# Patient Record
Sex: Female | Born: 1935 | Race: White | Hispanic: No | Smoking: Never smoker
Health system: Southern US, Community
[De-identification: ages and names within clinical notes are randomized; demographics above are authoritative.]

## PROBLEM LIST (undated history)

## (undated) DIAGNOSIS — R053 Chronic cough: Secondary | ICD-10-CM

## (undated) DIAGNOSIS — R600 Localized edema: Secondary | ICD-10-CM

## (undated) DIAGNOSIS — I119 Hypertensive heart disease without heart failure: Secondary | ICD-10-CM

## (undated) DIAGNOSIS — R05 Cough: Secondary | ICD-10-CM

## (undated) DIAGNOSIS — M1990 Unspecified osteoarthritis, unspecified site: Secondary | ICD-10-CM

## (undated) DIAGNOSIS — R609 Edema, unspecified: Secondary | ICD-10-CM

## (undated) DIAGNOSIS — K219 Gastro-esophageal reflux disease without esophagitis: Secondary | ICD-10-CM

## (undated) DIAGNOSIS — N390 Urinary tract infection, site not specified: Secondary | ICD-10-CM

## (undated) DIAGNOSIS — I442 Atrioventricular block, complete: Secondary | ICD-10-CM

## (undated) DIAGNOSIS — I639 Cerebral infarction, unspecified: Secondary | ICD-10-CM

## (undated) DIAGNOSIS — I4891 Unspecified atrial fibrillation: Secondary | ICD-10-CM

## (undated) DIAGNOSIS — I10 Essential (primary) hypertension: Secondary | ICD-10-CM

## (undated) HISTORY — DX: Gastro-esophageal reflux disease without esophagitis: K21.9

## (undated) HISTORY — PX: TONSILLECTOMY AND ADENOIDECTOMY: SHX28

## (undated) HISTORY — DX: Cough: R05

## (undated) HISTORY — DX: Essential (primary) hypertension: I10

## (undated) HISTORY — PX: SHOULDER SURGERY: SHX246

## (undated) HISTORY — DX: Localized edema: R60.0

## (undated) HISTORY — DX: Hypertensive heart disease without heart failure: I11.9

## (undated) HISTORY — DX: Unspecified osteoarthritis, unspecified site: M19.90

## (undated) HISTORY — PX: APPENDECTOMY: SHX54

## (undated) HISTORY — DX: Unspecified atrial fibrillation: I48.91

## (undated) HISTORY — DX: Atrioventricular block, complete: I44.2

## (undated) HISTORY — DX: Chronic cough: R05.3

## (undated) HISTORY — PX: PARTIAL HYSTERECTOMY: SHX80

## (undated) HISTORY — DX: Edema, unspecified: R60.9

---

## 1978-02-24 HISTORY — PX: INSERT / REPLACE / REMOVE PACEMAKER: SUR710

## 1994-04-26 HISTORY — PX: INSERT / REPLACE / REMOVE PACEMAKER: SUR710

## 1998-10-23 ENCOUNTER — Encounter: Payer: Self-pay | Admitting: Emergency Medicine

## 1998-10-23 ENCOUNTER — Emergency Department (HOSPITAL_COMMUNITY): Admission: EM | Admit: 1998-10-23 | Discharge: 1998-10-23 | Payer: Self-pay | Admitting: Emergency Medicine

## 1999-06-28 ENCOUNTER — Other Ambulatory Visit: Admission: RE | Admit: 1999-06-28 | Discharge: 1999-06-28 | Payer: Self-pay | Admitting: Obstetrics and Gynecology

## 1999-09-02 ENCOUNTER — Encounter: Payer: Self-pay | Admitting: Orthopaedic Surgery

## 1999-09-02 ENCOUNTER — Encounter: Admission: RE | Admit: 1999-09-02 | Discharge: 1999-09-02 | Payer: Self-pay | Admitting: Orthopaedic Surgery

## 1999-09-03 ENCOUNTER — Ambulatory Visit (HOSPITAL_BASED_OUTPATIENT_CLINIC_OR_DEPARTMENT_OTHER): Admission: RE | Admit: 1999-09-03 | Discharge: 1999-09-03 | Payer: Self-pay | Admitting: Orthopaedic Surgery

## 2000-12-14 ENCOUNTER — Other Ambulatory Visit: Admission: RE | Admit: 2000-12-14 | Discharge: 2000-12-14 | Payer: Self-pay | Admitting: Obstetrics and Gynecology

## 2001-07-22 ENCOUNTER — Encounter: Admission: RE | Admit: 2001-07-22 | Discharge: 2001-07-22 | Payer: Self-pay | Admitting: Family Medicine

## 2001-07-22 ENCOUNTER — Encounter: Payer: Self-pay | Admitting: Family Medicine

## 2001-08-19 ENCOUNTER — Ambulatory Visit (HOSPITAL_COMMUNITY): Admission: RE | Admit: 2001-08-19 | Discharge: 2001-08-19 | Payer: Self-pay | Admitting: Gastroenterology

## 2001-08-31 ENCOUNTER — Other Ambulatory Visit: Admission: RE | Admit: 2001-08-31 | Discharge: 2001-08-31 | Payer: Self-pay | Admitting: Obstetrics and Gynecology

## 2001-12-09 ENCOUNTER — Other Ambulatory Visit: Admission: RE | Admit: 2001-12-09 | Discharge: 2001-12-09 | Payer: Self-pay | Admitting: Obstetrics and Gynecology

## 2002-04-07 ENCOUNTER — Encounter: Payer: Self-pay | Admitting: Family Medicine

## 2002-04-07 ENCOUNTER — Encounter: Admission: RE | Admit: 2002-04-07 | Discharge: 2002-04-07 | Payer: Self-pay | Admitting: Family Medicine

## 2002-08-30 ENCOUNTER — Encounter: Admission: RE | Admit: 2002-08-30 | Discharge: 2002-08-30 | Payer: Self-pay | Admitting: Family Medicine

## 2002-08-30 ENCOUNTER — Encounter: Payer: Self-pay | Admitting: Family Medicine

## 2004-02-14 ENCOUNTER — Encounter: Admission: RE | Admit: 2004-02-14 | Discharge: 2004-02-14 | Payer: Self-pay | Admitting: Family Medicine

## 2005-01-15 ENCOUNTER — Ambulatory Visit (HOSPITAL_COMMUNITY): Admission: RE | Admit: 2005-01-15 | Discharge: 2005-01-15 | Payer: Self-pay | Admitting: Gastroenterology

## 2005-01-15 ENCOUNTER — Encounter (INDEPENDENT_AMBULATORY_CARE_PROVIDER_SITE_OTHER): Payer: Self-pay | Admitting: *Deleted

## 2005-06-04 ENCOUNTER — Emergency Department (HOSPITAL_COMMUNITY): Admission: EM | Admit: 2005-06-04 | Discharge: 2005-06-04 | Payer: Self-pay | Admitting: Emergency Medicine

## 2007-07-23 ENCOUNTER — Encounter: Admission: RE | Admit: 2007-07-23 | Discharge: 2007-07-23 | Payer: Self-pay | Admitting: Gastroenterology

## 2007-08-04 ENCOUNTER — Ambulatory Visit (HOSPITAL_COMMUNITY): Admission: RE | Admit: 2007-08-04 | Discharge: 2007-08-04 | Payer: Self-pay | Admitting: Gastroenterology

## 2007-08-11 ENCOUNTER — Encounter: Admission: RE | Admit: 2007-08-11 | Discharge: 2007-08-11 | Payer: Self-pay | Admitting: Gastroenterology

## 2008-05-05 ENCOUNTER — Ambulatory Visit (HOSPITAL_COMMUNITY): Admission: RE | Admit: 2008-05-05 | Discharge: 2008-05-05 | Payer: Self-pay | Admitting: Gastroenterology

## 2008-09-07 ENCOUNTER — Ambulatory Visit (HOSPITAL_BASED_OUTPATIENT_CLINIC_OR_DEPARTMENT_OTHER): Admission: RE | Admit: 2008-09-07 | Discharge: 2008-09-07 | Payer: Self-pay | Admitting: Orthopedic Surgery

## 2009-01-09 ENCOUNTER — Encounter: Admission: RE | Admit: 2009-01-09 | Discharge: 2009-01-09 | Payer: Self-pay | Admitting: Gastroenterology

## 2009-04-26 HISTORY — PX: INSERT / REPLACE / REMOVE PACEMAKER: SUR710

## 2009-05-11 ENCOUNTER — Ambulatory Visit (HOSPITAL_COMMUNITY): Admission: AD | Admit: 2009-05-11 | Discharge: 2009-05-12 | Payer: Self-pay | Admitting: Cardiology

## 2010-04-25 ENCOUNTER — Ambulatory Visit (HOSPITAL_COMMUNITY): Admission: RE | Admit: 2010-04-25 | Discharge: 2010-04-25 | Payer: Self-pay | Admitting: Gastroenterology

## 2010-06-27 ENCOUNTER — Ambulatory Visit: Payer: Self-pay | Admitting: *Deleted

## 2010-07-12 ENCOUNTER — Ambulatory Visit: Payer: Self-pay | Admitting: Cardiovascular Disease

## 2010-07-29 ENCOUNTER — Encounter: Admission: RE | Admit: 2010-07-29 | Discharge: 2010-07-29 | Payer: Self-pay | Admitting: Family Medicine

## 2010-08-01 ENCOUNTER — Ambulatory Visit: Payer: Self-pay | Admitting: Cardiology

## 2010-08-01 ENCOUNTER — Encounter: Payer: Self-pay | Admitting: Internal Medicine

## 2010-08-31 ENCOUNTER — Encounter: Payer: Self-pay | Admitting: Internal Medicine

## 2010-09-17 DIAGNOSIS — Z950 Presence of cardiac pacemaker: Secondary | ICD-10-CM

## 2010-09-18 DIAGNOSIS — I495 Sick sinus syndrome: Secondary | ICD-10-CM | POA: Insufficient documentation

## 2010-09-18 DIAGNOSIS — I10 Essential (primary) hypertension: Secondary | ICD-10-CM | POA: Insufficient documentation

## 2010-09-25 ENCOUNTER — Ambulatory Visit: Payer: Self-pay | Admitting: Internal Medicine

## 2010-11-28 NOTE — Letter (Signed)
Summary: [facility]  [facility]   Imported By: Marylou Mccoy 10/02/2010 10:29:56  _____________________________________________________________________  External Attachment:    Type:   Image     Comment:   External Document

## 2010-11-28 NOTE — Miscellaneous (Signed)
Summary: Device preload  Clinical Lists Changes  Observations: Added new observation of PPM INDICATN: Sick sinus syndrome (08/31/2010 13:10) Added new observation of MAGNET RTE: BOL 85 ERI 65 (08/31/2010 13:10) Added new observation of PPMLEADSTAT2: active (08/31/2010 13:10) Added new observation of PPMLEADSER2: 161096  (08/31/2010 13:10) Added new observation of PPMLEADMOD2: 4470  (08/31/2010 13:10) Added new observation of PPMLEADDOI2: 05/11/2009  (08/31/2010 13:10) Added new observation of PPMLEADLOC2: RV  (08/31/2010 13:10) Added new observation of PPMLEADSTAT1: active  (08/31/2010 13:10) Added new observation of PPMLEADSER1: 045409  (08/31/2010 13:10) Added new observation of PPMLEADMOD1: 4469  (08/31/2010 13:10) Added new observation of PPMLEADDOI1: 05/11/2009  (08/31/2010 13:10) Added new observation of PPMLEADLOC1: RA  (08/31/2010 13:10) Added new observation of PPM DOI: 05/11/2009  (08/31/2010 13:10) Added new observation of PPM SERL#: [id number]  (08/31/2010 13:10) Added new observation of PPM MODL#: [id number]  (08/31/2010 95:62) Added new observation of PACEMAKERMFG: Medtronic  (08/31/2010 13:10) Added new observation of PPM IMP MD: Duffy Rhody Tennant,MD  (08/31/2010 13:10) Added new observation of PPM REFER MD: Villa Herb  (08/31/2010 13:10) Added new observation of PACEMAKER MD: Hillis Range, MD  (08/31/2010 13:10)      PPM Specifications Following MD:  Hillis Range, MD     Referring MD:  Villa Herb PPM Vendor:  Medtronic     PPM Model Number:  [id number]     PPM Serial Number:  [id number] PPM DOI:  05/11/2009     PPM Implanting MD:  Rolla Plate  Lead 1    Location: RA     DOI: 05/11/2009     Model #: 4469     Serial #: [id number]     Status: active Lead 2    Location: RV     DOI: 05/11/2009     Model #: 4470     Serial #: [id number]     Status: active  Magnet Response Rate:  BOL 85 ERI 65  Indications:  Sick sinus syndrome

## 2010-11-28 NOTE — Cardiovascular Report (Signed)
Summary: Office Visit   Office Visit   Imported By: Roderic Ovens 10/01/2010 10:43:55  _____________________________________________________________________  External Attachment:    Type:   Image     Comment:   External Document

## 2010-11-28 NOTE — Assessment & Plan Note (Signed)
Summary: pacer check/ medtronic   Visit Type:  Initial Consult Referring Provider:  Dr Patty Sermons   History of Present Illness: [name] is a pleasant 77 yo WF with a h/o sinus node dysfunction and AV block s/p PPM who presents today to establish EP care.  She is followed by Dr Patty Sermons and has done well recently.  She reports stable BLE edema.  She denies symptoms of palpitations, chest pain, shortness of breath, orthopnea, PND,dizziness, presyncope, syncope, or neurologic sequela. The patient is tolerating medications without difficulties and is otherwise without complaint today.   Current Medications (verified): 1)  Oxybutynin Chloride 5 Mg Xr24h-Tab (Oxybutynin Chloride) .... Once Daily 2)  Nexium 40 Mg Cpdr (Esomeprazole Magnesium) .... Once Daily 3)  Zoloft 100 Mg Tabs (Sertraline Hcl) .Marland Kitchen.. 1 1/2 Daily 4)  Hydrochlorothiazide 25 Mg Tabs (Hydrochlorothiazide) .... Take One Tablet By Mouth Daily. 5)  Amlodipine Besylate 5 Mg Tabs (Amlodipine Besylate) .... 1/2 Tablet Once Daily 6)  Metoprolol Succinate 25 Mg Xr24h-Tab (Metoprolol Succinate) .... 1/2 Tablet 7)  Aspirin 81 Mg Tbec (Aspirin) .... Take One Tablet By Mouth Daily 8)  Citracal Plus  Tabs (Multiple Minerals-Vitamins) .... Uad 9)  Eye Vitamins  Caps (Multiple Vitamins-Minerals) .... Uad 10)  Vitamin B-12 1000 Mcg Tabs (Cyanocobalamin) .... Daily 11)  Multivitamins   Tabs (Multiple Vitamin) .... Daily 12)  Vitamin C 500 Mg  Tabs (Ascorbic Acid) 13)  Fish Oil   Oil (Fish Oil) 14)  Vitamin D3 5000 Unit Caps (Cholecalciferol) .... Daily 15)  Benadryl 25 Mg Caps (Diphenhydramine Hcl) .... As Needed 16)  Optivar 0.05 % Soln (Azelastine Hcl) .... Uad 17)  Systane 0.4-0.3 % Soln (Polyethyl Glycol-Propyl Glycol) .... Uad  Allergies (verified): No Known Drug Allergies  Past History:  Past Medical History: Reviewed history from 09/17/2010 and no changes required. Sick Sinus syndrome Fatigue Atrial Fibrillation G E R  D Hypertension  Past Surgical History: Reviewed history from 09/17/2010 and no changes required. Abdominal Hysterectomy-Total Appendectomy Tonsillectomy  Family History: Reviewed history from 09/17/2010 and no changes required. Heart Failure Family History of CVA or Stroke:   Social History: Pt lives in [place] Use - No.  Alcohol Use - no  Review of Systems       All systems are reviewed and negative except as listed in the HPI.   Vital Signs:  Patient profile:   77 year old female Height:      63 inches Weight:      134 pounds BMI:     23.82 Pulse rate:   83 / minute BP sitting:   150 / 80  (left arm)  Vitals Entered By: Laurance Flatten CMA (September 25, 2010 10:55 AM)  Physical Exam  General:  Well developed, well nourished, in no acute distress. Head:  normocephalic and atraumatic Eyes:  PERRLA/EOM intact; conjunctiva and lids normal. Mouth:  Teeth, gums and palate normal. Oral mucosa normal. Neck:  supple Chest Wall:  bilateral pacemaker pockets are well healed Lungs:  Clear bilaterally to auscultation and percussion. Heart:  RRR (paced) Abdomen:  Bowel sounds positive; abdomen soft and non-tender without masses, organomegaly, or hernias noted. No hepatosplenomegaly. Msk:  Back normal, normal gait. Muscle strength and tone normal. Pulses:  pulses normal in all 4 extremities Extremities:  1+ BLE edema Neurologic:  Alert and oriented x 3. Skin:  Intact without lesions or rashes. Cervical Nodes:  no significant adenopathy Psych:  Normal affect.   PPM Specifications Following MD:  Hillis Range, MD  Referring MD:  Villa Herb PPM Vendor:  Medtronic     PPM Model Number:  [id number]     PPM Serial Number:  [id number] PPM DOI:  05/11/2009     PPM Implanting MD:  Duffy Rhody Tennant,MD  Lead 1    Location: RA     DOI: 05/11/2009     Model #: 4469     Serial #: [id number]     Status: active Lead 2    Location: RV     DOI: 05/11/2009     Model #: 4470      Serial #: [id number]     Status: active  Magnet Response Rate:  BOL 85 ERI 65  Indications:  Sick sinus syndrome   PPM Follow Up Battery Voltage:  2.80 V     Battery Est. Longevity:  8.5 yrs       PPM Device Measurements Atrium  Amplitude: 5.60 mV, Impedance: 471 ohms, Threshold: 0.50 V at 0.40 msec Right Ventricle  Amplitude: 11.20 mV, Impedance: 445 ohms, Threshold: 0.750 V at 0.40 msec  Episodes MS Episodes:  177     Percent Mode Switch:  <0.1%     Ventricular High Rate:  0     Atrial Pacing:  70.1%     Ventricular Pacing:  85.1%  Parameters Mode:  DDDR     Lower Rate Limit:  60     Upper Rate Limit:  110 Paced AV Delay:  200     Sensed AV Delay:  150 Next Remote Date:  12/26/2010     Next Cardiology Appt Due:  08/28/2011 Tech Comments:  [facility]---177 MODE SWITCHES--LONGEST WAS 2 MINUTES 2 SECONDS.  NORMAL DEVICE FUNCTION. PT HAS SECOND PACER SET TO VVI 65.   NO CHANGES MADE. CARELINK 12-26-10 AND ROV IN 12 MTHS W/JA. Vella Kohler  September 25, 2010 11:12 AM MD Comments:  Pts Thera pacamker has reverted to EOL and is VVI backup pacing at 65 bpm.  This creates occasional AV dyssynchrony.  I have therefore reprogrammed her new pacemaker to DDD 70 bpm to avoid pacing from her abandoned thera device.  Impression & Recommendations:  Problem # 1:  SICK SINUS/ TACHY-BRADY SYNDROME (ICD-427.81) She has AV conduction disease as well as sinus node dysfunction. Pacemaker reprogrammed as above today. s  Problem # 2:  HYPERTENSION, UNSPECIFIED (ICD-401.9) stable salt restriction  Patient Instructions: 1)  Your physician wants you to follow-up in:  1ths with D rAllred  You will receive a reminder letter in the mail two months in advance. If you don't receive a letter, please call our office to schedule the follow-up appointment. 2)  Carelink transmission on 12/26/2010

## 2010-11-29 ENCOUNTER — Ambulatory Visit (INDEPENDENT_AMBULATORY_CARE_PROVIDER_SITE_OTHER): Payer: MEDICARE | Admitting: Cardiology

## 2010-11-29 DIAGNOSIS — R609 Edema, unspecified: Secondary | ICD-10-CM

## 2010-11-29 DIAGNOSIS — Z950 Presence of cardiac pacemaker: Secondary | ICD-10-CM

## 2010-11-29 DIAGNOSIS — I119 Hypertensive heart disease without heart failure: Secondary | ICD-10-CM

## 2010-12-03 ENCOUNTER — Ambulatory Visit: Payer: Self-pay | Admitting: Cardiology

## 2010-12-13 ENCOUNTER — Other Ambulatory Visit (INDEPENDENT_AMBULATORY_CARE_PROVIDER_SITE_OTHER): Payer: MEDICARE

## 2010-12-13 DIAGNOSIS — R609 Edema, unspecified: Secondary | ICD-10-CM

## 2010-12-13 DIAGNOSIS — I10 Essential (primary) hypertension: Secondary | ICD-10-CM

## 2010-12-29 ENCOUNTER — Encounter (INDEPENDENT_AMBULATORY_CARE_PROVIDER_SITE_OTHER): Payer: Self-pay | Admitting: *Deleted

## 2011-01-07 NOTE — Letter (Signed)
Summary: Device-Delinquent Phone Journalist, newspaper, Main Office  [address]   [place], [place] [address]   Phone: [phone]  Fax: [phone]     December 29, 2010 MRN: [id number]   Brenda Cook [address] [place], [place]  [address]   Dear [name],  According to our records, you were scheduled for a device phone transmission on 12-26-10.     We did not receive any results from this check.  If you transmitted on your scheduled day, please call us to help troubleshoot your system.  If you forgot to send your transmission, please send one upon receipt of this letter.  Thank you,   Architectural technologist [facility]

## 2011-02-16 ENCOUNTER — Other Ambulatory Visit: Payer: Self-pay | Admitting: Internal Medicine

## 2011-02-17 ENCOUNTER — Ambulatory Visit (INDEPENDENT_AMBULATORY_CARE_PROVIDER_SITE_OTHER): Payer: MEDICARE | Admitting: *Deleted

## 2011-02-17 DIAGNOSIS — I495 Sick sinus syndrome: Secondary | ICD-10-CM

## 2011-02-17 DIAGNOSIS — Z950 Presence of cardiac pacemaker: Secondary | ICD-10-CM

## 2011-02-21 NOTE — Progress Notes (Signed)
Pacer remote check  

## 2011-03-05 ENCOUNTER — Encounter: Payer: Self-pay | Admitting: *Deleted

## 2011-03-07 ENCOUNTER — Other Ambulatory Visit: Payer: Self-pay | Admitting: Cardiology

## 2011-03-07 MED ORDER — HYDROCHLOROTHIAZIDE 25 MG PO TABS: 25.0000 mg | ORAL_TABLET | Freq: Every day | ORAL | Status: DC

## 2011-03-07 NOTE — Telephone Encounter (Signed)
Called daughter to schedule office visit for June, stated she was in the grocery store and would call back next week

## 2011-03-07 NOTE — Telephone Encounter (Signed)
Pt's daughter called want rx called to [facility] for furosimide and HCTZ 90 day supply

## 2011-03-11 NOTE — Op Note (Signed)
Brenda Cook, Brenda Cook                ACCOUNT NO.:  [id number]   MEDICAL RECORD NO.:  [id number]          PATIENT TYPE:  AMB   LOCATION:  DSC                          FACILITY:  [facility]   PHYSICIAN:  Cindee Salt, M.D.       DATE OF BIRTH:  1920/12/26   DATE OF PROCEDURE:  09/07/2008  DATE OF DISCHARGE:                               OPERATIVE REPORT   PREOPERATIVE DIAGNOSIS:  Carpal tunnel syndrome right hand, cubital  tunnel right elbow.   POSTOPERATIVE DIAGNOSIS:  Carpal tunnel syndrome right hand, cubital  tunnel right elbow.   OPERATION:  Decompression median nerve of the wrist and ulnar nerve at  the elbow, right arm.   SURGEON:  Cindee Salt, MD   ASSISTANT:  Carolyne Fiscal, RN   ANESTHESIA:  General.   ANESTHESIOLOGIST:  Bedelia Person, MD   HISTORY:  The patient is an 77 year old female with history of carpal  tunnel syndrome, EMG nerve conductions positive and this has not  responded to conservative treatment.  She has elected to undergo  decompression of both median nerve at the wrist, the ulnar nerve at the  elbow.  She is aware that there is no guarantee with the surgery,  possibility of infection, recurrence injury to arteries, nerves,  tendons, complete relief of symptoms, and dystrophy.  In the  preoperative area, the patient is seen.  The extremity marked by both  the patient and surgeon.  Questions again encouraged and answered.  Antibiotic given.   PROCEDURE:  The patient was brought to the operating room where general  anesthetic was carried out without difficulty under the direction of Dr.  Gypsy Balsam.  She was prepped using DuraPrep in supine position, right arm  free.  A time-out was taken.  The limb was exsanguinated with an Esmarch  bandage and tourniquet placed high on the arm was inflated to 250 mmHg.  A longitudinal incision was made in the palm and carried down through  subcutaneous.  Bleeders were electrocauterized.  The palmar fascia was  split.  Superficial palmar arch  identified.  The flexor tendon of the  ring and little finger identified to the ulnar side of median nerve and  carpal retinaculum was incised with sharp dissection.  A right angle and  Sewall retractor were placed between the skin and forearm fascia.  The  fascia released for approximately a centimeter and half proximal to the  wrist crease under direct vision.  The area of compression to the nerve  was apparent.  No further lesions were identified.  The wound was  copiously irrigated with saline.  The skin closed with interrupted 5-0  Vicryl Rapide sutures.  A separate incision was then made over the  medial epicondyle approximately 1 to 1-1/2 cm in length, carried down  through the subcutaneous tissue.  Posterior branches of the medial,  antebrachial and cutaneous nerve of the forearm was identified and  protected.  Retractors were placed.  A fasciotomy performed to the  flexor carpi ulnaris.  The deep fascia was then exposed.  Retractors  then placed and the fascia released over the  nerve.  After releasing the  Osborne fascia, proximally retractors were again placed.  Dissection  carried proximally up to the arcade of Struthers.  The nerve was  released proximally over approximately 8 cm.  The elbow was placed  through full flexion.  No subluxation to the nerve was apparent.  The  wound was irrigated and closed with interrupted 5-0 Vicryl Rapide  sutures in the skin only.  A sterile compressive dressing and splint to  the elbow in approximately 30 degrees of flexion was applied.  The wrist  was included.  The  fingers were left free.  On deflation of the tourniquet, all fingers  immediately pinked.  She was taken to the recovery room for observation  in satisfactory condition.  She will be discharged home to return to the  [facility] [place] in 1 week on Vicodin.           ______________________________  Cindee Salt, M.D.     GK/MEDQ  D:  09/07/2008  T:  09/07/2008  Job:   [id number]   cc:   Bryan Lemma. Manus Gunning, M.D.

## 2011-03-11 NOTE — Op Note (Signed)
NAMEMIRAKLE, TOMLIN                ACCOUNT NO.:  [id number]   MEDICAL RECORD NO.:  [id number]          PATIENT TYPE:  AMB   LOCATION:  ENDO                         FACILITY:  [facility]   PHYSICIAN:  Danise Edge, M.D.   DATE OF BIRTH:  [birth year]   DATE OF PROCEDURE:  05/05/2008  DATE OF DISCHARGE:                               OPERATIVE REPORT   ESOPHAGOGASTRODUODENOSCOPY REPORT   REFERRING PHYSICIAN:  Bryan Lemma. Manus Gunning, MD.   PROCEDURE INDICATIONS:  Brenda Cook is an 76 year old female born  on Sep 21, 1921.  Brenda Cook is undergoing diagnostic  esophagogastroduodenoscopy to evaluate esophageal dysphagia.   In 2002, her esophagogastroduodenoscopy was normal.  On July 23, 2007, she underwent a barium tablet and liquid barium swallow at  [facility], which revealed a persistent large smooth impression  on the mid to distal esophagus.  The 13-mm barium tablet freely passed  down the esophagus and into the stomach.  Esophageal dysmotility was  noted.   She underwent a CT scan of the chest to evaluate the esophagus.  Lower  thoracic aortic tortuosity creates esophageal tortuosity and likely  account for the findings on the esophagram.  No definite esophageal mass  was identified.   MEDICATION ALLERGIES:  None.   PAST MEDICAL - SURGICAL HISTORY:  Cataract surgery, pacemaker placement,  hysterectomy, and proctocolonoscopy to the cecum with removal of a 2-mm  sessile polyp from the hepatic flexure in March 2002.  Normal  esophagogastroduodenoscopy in 2002.   PROCEDURE:  Diagnostic esophagogastroduodenoscopy.   PREMEDICATION:  Fentanyl 25 mcg and Versed 3 mg.   PROCEDURE:  After obtaining informed consent, Brenda Cook was placed in  the left lateral decubitus position.  I administered intravenous  fentanyl and intravenous Versed to achieve conscious sedation for the  procedure.  The patient's blood pressure, oxygen saturation, and cardiac  rhythm were  monitored throughout the procedure and documented in the  medical record.   The Pentax gastroscope was passed through the posterior hypopharynx into  the esophagus without difficulty.  I did not visualize the vocal cords.   Esophagoscopy:  The proximal mid and lower segments of the esophageal  mucosa appeared completely normal.  There was no evidence for the  presence of Barrett's esophagus, erosive esophagitis, esophageal  stricture formation, or esophageal obstruction.  In the mid esophagus,  there was the impression of the thoracic aorta as demonstrated by her  CAT scan last year.   Gastroscopy:  Brenda Cook has a moderate-sized hiatal hernia.  Retroflexed view of the gastric cardia and fundus was normal.  The  diaphragmatic hiatus was patulous.  The gastric body, antrum, and  pylorus appeared completely normal.   Duodenoscopy:  The duodenal bulb and descending duodenum appeared  completely normal.   ASSESSMENT:  Essentially a normal esophagogastroduodenoscopy.  A  moderate-sized hiatal hernia is present.  There is no evidence for  mechanical esophageal obstruction.   RECOMMENDATIONS:  Brenda Cook has esophageal dysmotility as a cause for  her intermittent esophageal dysphagia.  She requires no therapy for  this.  ______________________________  Danise Edge, M.D.     MJ/MEDQ  D:  05/05/2008  T:  05/05/2008  Job:  [id number]   cc:   Bryan Lemma. Manus Gunning, M.D.

## 2011-03-11 NOTE — Op Note (Signed)
NAMEAYNSLEE, MULHALL NO.:  [id number]   MEDICAL RECORD NO.:  [id number]          PATIENT TYPE:  AMB   LOCATION:  ENDO                         [facility]   PHYSICIAN:  Danise Edge, M.D.   DATE OF BIRTH:  1920/11/05   DATE OF PROCEDURE:  08/04/2007  DATE OF DISCHARGE:                               OPERATIVE REPORT   PROCEDURE INDICATIONS:  Ms. Annaleigh Steinmeyer is an 77 year old female born  August 04, 1921.   When Ms. Buzan began experiencing esophageal dysphagia she underwent a  barium tablet - liquid barium swallow on July 23, 2007 at  [facility] which revealed a persistent large smooth impression  on the mid to distal esophagus.  The 13-mm barium tablet freely passed  down the esophagus and into the stomach.  Esophageal dysmotility was  noted.   MEDICATION ALLERGIES:  None.   PAST MEDICAL-SURGICAL HISTORY:  Cataract surgery, pacemaker placement,  hysterectomy March 2006, proctocolonoscopy to the cecum with removal of  a 2-mm sessile polyp from the hepatic flexure, 2002  esophagogastroduodenoscopy normal.   ENDOSCOPIST:  Danise Edge, M.D.   PREMEDICATION:  Fentanyl 25 mcg, Versed 3 mg.   PROCEDURE:  After obtaining informed consent, Ms. Kulpa was placed in  the left lateral decubitus position.  I administered intravenous  fentanyl and intravenous Versed to achieve conscious sedation for the  procedure.  The patient's blood pressure, oxygen saturation and cardiac  rhythm were monitored throughout the procedure and documented in the  medical record.   The Pentax gastroscope was passed through the posterior hypopharynx into  the proximal esophagus without difficulty.  The hypopharynx, larynx and  vocal cords appeared normal.   Esophagoscopy:  The proximal, mid and lower segments of the esophageal  mucosa appear completely normal.  The squamocolumnar junction and  esophagogastric junction are noted at approximately 40 cm from the  incisor teeth.  At approximately 30 - 35 cm from the incisor teeth there  is a subepithelial mass.   Gastroscopy:  Retroflex view of the gastric cardia and fundus was  normal.  The gastric body, antrum and pylorus appeared normal.   Duodenoscopy:  The duodenal bulb and descending duodenum appeared  normal.   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  This could be due to extrinsic compression of the  esophagus or a tumor in the wall of the esophagus.   PLAN:  Will schedule Ms. Bamberg for a CT scan of the chest as  recommended by radiology.           ______________________________  Danise Edge, M.D.     MJ/MEDQ  D:  08/04/2007  T:  08/04/2007  Job:  [id number]   cc:   Bryan Lemma. Manus Gunning, M.D.  Fax: [phone]

## 2011-03-11 NOTE — Cardiovascular Report (Signed)
Brenda Cook, Brenda Cook                ACCOUNT NO.:  [id number]   MEDICAL RECORD NO.:  [id number]          PATIENT TYPE:  INP   LOCATION:  2020                         FACILITY:  [facility]   PHYSICIAN:  Colleen Can. Deborah Chalk, M.D.DATE OF BIRTH:  11-14-1920   DATE OF PROCEDURE:  05/11/2009  DATE OF DISCHARGE:                            CARDIAC CATHETERIZATION   PROCEDURE:  Implantation of a new pulse generator and new atrial and  ventricular leads because of end of life and failure of old lead system.  The old lead system and old pulse generator were programmed off but were  not removed.   The left subclavicular area was prepped and draped.  The area was  infiltrated with 1% Xylocaine.  A subcutaneous pocket was created in the  prepectoral fascia.  Two punctures were made into the subclavian vein  over the top first rib.  Using 7-[facility] introducers, the atrial and  ventricular leads were introduced.  The ventricular lead is a Guidant  bipolar screw-in lead, model 4470, 52 cm, serial number [id number].  Initially, it was positioned in the right ventricular apex but threshold  was unsatisfactory.  It was subsequently positioned on the septum with  excellent thresholds.  The R waves measured 11.1 mV.  Ventricular lead  impedance was 490 ohms.  The ventricular capture threshold was 0.4 volts  with a current of 1.1 mA with 0.5 seconds pulse width.   The atrial lead is a Guidant bipolar screw-in lead, model 4469, 45 cm,  serial number [id number].  The atrial thresholds showed P waves of 2.1  mV.  Atrial lead impedance is 481 ohms.  Atrial capture threshold is 0.8  volts with a current of 1.4 mA of 0.5 milliseconds pulse width.  The  leads were sutured in place.  Wound was flushed with kanamycin solution.  The leads were connected to a Medtronic VERSA VEDRO1, unit number  [id number].  The unit was sutured in place.  The wound was closed with 2-  0 and subsequently 4-0 Vicryl.  Steri-Strips were  applied.  The patient  tolerated the procedure well.      Colleen Can. Deborah Chalk, M.D.  Electronically Signed     SNT/MEDQ  D:  05/11/2009  T:  05/11/2009  Job:  [id number]

## 2011-03-11 NOTE — H&P (Signed)
Brenda Cook, Brenda Cook                ACCOUNT NO.:  [id number]   MEDICAL RECORD NO.:  [id number]           PATIENT TYPE:   LOCATION:                                 FACILITY:   PHYSICIAN:  Colleen Can. Deborah Chalk, M.D.    DATE OF BIRTH:   DATE OF ADMISSION:  05/11/2009  DATE OF DISCHARGE:                              HISTORY & PHYSICAL   CHIEF COMPLAINT:  None.   HISTORY OF PRESENT ILLNESS:  Brenda Cook is a very pleasant elderly 77-year-  old female who is referred for pacemaker implantation.  She has an  original pacemaker unit in place that was originally placed in 1979.  She has a known fractured atrial lead.  Her ventricular lead is unipolar  and has a high impedance level.  She had a new atrial lead placed with  generator change out in July 1995.  She now presents with end-of-life  parameters.  She is subsequently referred for implantation of a new dual-  chamber system on the opposite side.  Clinically, she has done well with  no complaints of chest pain, shortness of breath, lightheadedness, or  dizziness.   PAST MEDICAL HISTORY:  1. Sick sinus syndrome with history of pacemaker implanted in May      1979.  She had change out in July 1995 and at that time had a new      atrial lead placed.  Her ventricular lead has been programmed      unipolar and has a high impedance level.  She has been programmed      VVI.  2. History of appendectomy, tonsillectomy, partial hysterectomy.  3. Lower extremity edema.  4. Hypertension.  5. Chronic cough.  6. Remote history of transient atrial fibrillation.  7. Chronic fatigue.  8. History of shoulder surgery.  9. GERD.  10.Arthritis.   ALLERGIES:  ACE INHIBITORS cause cough.  There is questionable allergy  to [facility].   CURRENT MEDICATIONS:  1. Zoloft 50 mg 1-1/2 tablets daily.  2. Nexium daily.  3. Hydrochlorothiazide 25 mg half a tablet every other day.  4. Baby aspirin daily.  5. Detrol LA daily.  6. Robitussin p.r.n.  7. Advil p.r.n.  8. Benadryl p.r.n.  9. Amlodipine 5 mg a day.  10.Citrucel daily.  11.B CAP daily.  12.ICaps daily.  13.B12 daily.  14.Multivitamin daily.  15.Flaxseed oil daily.   FAMILY HISTORY:  Father died at 13 with stroke.  Mother died at age 2  of heart failure.   SOCIAL HISTORY:  She has no current alcohol or tobacco use.  She has a  daughter that is a very attentive.  She previously worked at [employer].   REVIEW OF SYSTEMS:  She has had no recent fever, flu, or chills.  She  ambulates with the assistance of a cane.  She denies chest pain,  shortness of breath, lightheadedness, dizziness.  She does have some  degree of chronic fatigue.  All other review of systems are negative.   PHYSICAL EXAMINATION:  GENERAL:  She is a very pleasant elderly white  female, who looks younger  than her stated age.  She is in no acute  distress.  She is alert and conversive.  VITAL SIGNS:  Weight is 134 pounds.  Blood pressure 150/78, heart rate  66 and regular, respirations 18.  She is afebrile.  SKIN:  Warm and dry.  Color is unremarkable.  HEENT:  Normocephalic, atraumatic.  Pupils are equal and reactive.  Sclerae is nonicteric.  NECK:  Supple.  No masses.  No JVD.  LUNGS:  Clear.  HEART:  Regular rhythm.  Pacemaker is in the right upper chest.  ABDOMEN:  Soft positive bowel sounds, nontender.  EXTREMITIES:  Without edema.  MUSCULOSKELETAL:  Strength to be symmetric.  Gait and range of motion  are intact.  NEUROLOGIC:  No gross focal deficits.   PERTINENT LABORATORY DATA:  Pending.   OVERALL IMPRESSION:  1. End-of-life pulse generator.  2. Abnormal function of the ventricular lead with high impedance level      in the unipolar setting.  3. Hypertension.   PLAN:  We will proceed on with new implantation of a dual-chamber  system.  This implantation will occur on the left side.  The procedure  has been reviewed in full detail, and she is willing to proceed on  Friday, May 11, 2009.      Sharlee Blew, N.P.      Colleen Can. Deborah Chalk, M.D.  Electronically Signed    LC/MEDQ  D:  05/09/2009  T:  05/10/2009  Job:  [id number]

## 2011-03-14 NOTE — Op Note (Signed)
Brenda Cook, Brenda Cook NO.:  [id number]   MEDICAL RECORD NO.:  [id number]          PATIENT TYPE:  AMB   LOCATION:  ENDO                         FACILITY:  [facility]   PHYSICIAN:  Danise Edge, M.D.   DATE OF BIRTH:  08-04-1921   DATE OF PROCEDURE:  01/15/2005  DATE OF DISCHARGE:                                 OPERATIVE REPORT   PROCEDURE INDICATIONS:  Brenda Cook is an 77 year old female born  [birth year]. Brenda Cook is scheduled to undergo her first screening  colonoscopy with polypectomy to prevent colon cancer.   ENDOSCOPIST:  Danise Edge, M.D.   PREMEDICATION:  Versed 6 mg, Demerol 40 mg.   PROCEDURE:  After obtaining informed consent, Brenda Cook was placed in the  left lateral decubitus position. I administered intravenous Demerol and  intravenous Versed to achieve conscious sedation for the procedure. The  patient's blood pressure, oxygen saturation and cardiac rhythm were  monitored throughout the procedure and documented in the medical record.   Anal inspection and digital rectal exam were normal. The Olympus adjustable  pediatric colonoscope was introduced into the rectum and with a fair amount  of difficulty eventually advanced to the cecum. Brenda Cook has a very  tortuous left colon prone to loop formation making advancement of the  endoscope difficult. Colonic preparation for the exam today was excellent.   Rectum normal.  Sigmoid colon and descending colon normal.  Splenic flexure normal.  Transverse colon normal.  Hepatic flexure:  The 2-mm sessile polyp was removed from the hepatic  flexure with electrocautery snare. Post colonoscopic polypectomy, the site  did not appear to have received enough cautery. I applied two Endo clips to  the polypectomy site to prevent post polypectomy bleeding.  Descending colon normal.  Cecum and ileocecal valve normal.   ASSESSMENT:  1.  Difficult colon to negotiate with the colonoscope.  2.  A  small polyp was removed from the hepatic flexure with electrocautery      snare. To prevent post polypectomy bleeding, two Endo clips were applied      to the polypectomy site.    MJ/MEDQ  D:  01/15/2005  T:  01/15/2005  Job:  [id number]   cc:   Dr. Suzzette Righter

## 2011-03-14 NOTE — Procedures (Signed)
[place]. [facility]  Patient:    GERICA, KOBLE Visit Number: [id number] MRN: [id number]          Service Type: Attending:Martin Brooke Bonito, M.D. Dictated by:   Charolett Bumpers III,M.D. Proc. Date: 08/19/01   CC:         Dyanne Carrel, M.D.   Procedure Report  REFERRING PHYSICIAN:  Dyanne Carrel, M.D., [facility].  PROCEDURE:  Esophagogastroduodenoscopy.  PROCEDURE INDICATION:  Ms. Brenda Cook (date of birth 03/07/1921) isa 77 year old female.  She presented to Dr. Manus Gunning with gastroesophageal reflux symptoms manifested by dysphagia and heartburn.  In early October 2002 she was placed on Nexium 40 mg each morning.  On July 22, 2001, she underwent a barium swallow x-ray, which revealed marked gastroesophageal reflux, a small hiatal hernia, and poor esophageal peristalsis.  Ms. Delmar tells me the Nexium has improved her acid reflux-type symptoms. She reports no weight loss or gastrointestinal bleeding.  MEDICATION ALLERGIES:  None.  CHRONIC MEDICATIONS:  Ogen, Zoloft, Nexium, Advil p.r.n., vitamin C, Ocuvite.  PAST MEDICAL HISTORY:  Cataractsurgery, pacemaker surgery.  HABITS:  The patient does not smoke cigarettes or consume alcohol.  SOCIAL HISTORY:  Ms. Geerdes continues towork part-time.  Her husband is deceased.  Her 80 year old son has diabetes; her 9 year old daughter is in good health.  ENDOSCOPIST:  Verlin Grills, M.D.  PREMEDICATION:  Versed 3 mg, fentanyl 25 mcg.  ENDOSCOPE:  Olympus gastroscope.  DESCRIPTION OF PROCEDURE:  After obtaining informed consent, Ms. Leland was placed in the left lateral decubitus position.  I discussed with her the complications associated with esophagogastroduodenoscopy and Savary esophageal dilation, including intestinal bleeding and intestinal perforation.  I then administered intravenous Versed and intravenous fentanyl to  achieve conscious sedation for the procedure.  The patients blood pressure, oxygen saturation, and cardiac rhythm were monitored throughout the procedure and documented in the medical record.  The Olympus gastroscope was advanced through the posterior hypopharynx into the proximal esophagus without difficulty.The hypopharynx, larynx, and vocal cords appeared normal.  Esophagoscopy:  The proximal, mid-, and lower segments of the esophagus appeared normal.  The squamocolumnar junction and the esophagogastric junction are noted at approximately 37 cm from the incisor teeth.  Endoscopically there is no evidence for the presence of Barretts esophagus, erosive esophagitis, esophageal ulceration, or esophageal mucosal scarring.  There is no obstruction of the esophagus.  Gastroscopy:  Retroflexed view ofthe gastric cardia and fundus was normal. There is a small hiatal hernia.  The diaphragmatic hiatus is patulous.  In the distal gastric body-proximal gastric antrum there are scattered vascular ectasia (the beginning of watermelon stomach).  The majority of the antrum and pylorus appear completely normal and are uninvolved with vascular ectasia.  Duodenoscopy:  The duodenal bulb, mid-duodenum, and distal duodenum appeared normal.  ASSESSMENT: 1. Gastroesophageal reflux disease associated with a small hiatal hernia but    unassociated with esophageal obstruction, erosive esophagitis, Barretts    esophagus. 2. Vascular ectasia involvingthe distal stomach-proximal antrum, which    requires no therapy at this point.  RECOMMENDATIONS:  Continue proton pump inhibitor therapy. Dictated by:   Verlin Grills, M.D. Attending:  Verlin Grills, M.D. DD:  08/19/01 TD:  08/20/01 Job: [id number]

## 2011-03-14 NOTE — Op Note (Signed)
[place]. [facility]  Patient:    Brenda Cook                        MRN: [id number] Proc. Date: 09/03/99 Adm. Date:  [id number] Attending:  Marcene Corning                           Operative Report  PREOPERATIVE DIAGNOSES:  Left shoulder impingement and calcific tendinitis.  POSTOPERATIVE DIAGNOSES: 1. Left shoulder impingement. 2. Left shoulder rotator cuff tear.  PROCEDURE: 1. Left shoulder acromioplasty. 2. Left shoulder rotator cuff debridement. 3. Left shoulder partial excision of distal clavicle.  ANESTHESIA:  General.  SURGEON:  Lubertha Basque. Jerl Santos, M.D.  ASSISTANT:  Prince Rome, P.A.  INDICATIONS:  The patient is a 77 year old woman with a long history of left shoulder pain.  This has persisted, despite at least three injections.  She was  offered operative intervention at this time, to consist of an arthroscopy.  The  procedure was discussed with the patient, and an informed operative consent was  obtained, after a discussion of the possible complications of, reaction to anesthesia, and infection.  DESCRIPTION OF PROCEDURE:  The patient was taken to the operating suite where a  general anesthetic was applied with the endotracheal tube without difficulty. he was then positioned in the beach chair position and prepped and draped in the normal sterile fashion.  After the administration of preoperative antibiotics, n arthroscopy of the left shoulder was performed through a total of three portals. The glenohumeral joint showed no degenerative changes.  The biceps tendon and all labral structures were well-attached.  The rotator cuff had a massive tear and ome extensive calcium deposits throughout the cuff.  The deposits were removed along with a thorough debridement of the remaining rotator cuff.  She had a large subacromial spur which suggested the decompression back to a flat surface. There was also some intention by the  distal clavicle, and the undersurface of this bone was removed.  The [facility] joint was not formerly decompressed.  The shoulder was then thoroughly irrigated, followed by the placement of Marcaine with epinephrine and morphine.  Simple sutures of nylon were used to reapproximate the three portals  loosely, followed by Adaptic and a dry gauze dressing with tape.  The estimated blood loss and intraoperative fluids can be obtained from the anesthesia records.  DISPOSITION:  The patient was extubated in the operating room and taken to the recovery room in stable condition.  PLAN:  For her to go home the same day and to follow up in the office in less than one week.  I will contact her by phone tonight. DD:  09/03/99 TD:  09/04/99 Job: [id number]

## 2011-04-14 ENCOUNTER — Telehealth: Payer: Self-pay | Admitting: Cardiology

## 2011-04-14 DIAGNOSIS — R609 Edema, unspecified: Secondary | ICD-10-CM

## 2011-04-14 MED ORDER — FUROSEMIDE 20 MG PO TABS: 20.0000 mg | ORAL_TABLET | Freq: Every day | ORAL | Status: DC

## 2011-04-14 NOTE — Telephone Encounter (Signed)
PT NEEDS REFILL FOR FUROSIMIDE SENT TO [facility] MAIL ORDER.

## 2011-04-14 NOTE — Telephone Encounter (Signed)
Sent in rx.  Left message to call back and schedule appointment, ok to see Lawson Fiscal

## 2011-04-16 ENCOUNTER — Telehealth: Payer: Self-pay | Admitting: Nurse Practitioner

## 2011-04-16 NOTE — Telephone Encounter (Signed)
Pt's daughter had a question She has bought a 300 watt csl mercury light and it says may interfere with electrical devices She wants to know if this would effect her mother's pacemaker

## 2011-04-16 NOTE — Telephone Encounter (Signed)
OK 

## 2011-04-16 NOTE — Telephone Encounter (Signed)
Spoke with patient's daughter and referred her question to tech support @ 1-800-[employer].

## 2011-04-18 ENCOUNTER — Encounter: Payer: Self-pay | Admitting: *Deleted

## 2011-04-25 ENCOUNTER — Ambulatory Visit: Payer: Self-pay | Admitting: Nurse Practitioner

## 2011-05-08 ENCOUNTER — Encounter: Payer: Self-pay | Admitting: Cardiology

## 2011-05-09 ENCOUNTER — Encounter: Payer: Self-pay | Admitting: Nurse Practitioner

## 2011-05-09 ENCOUNTER — Ambulatory Visit (INDEPENDENT_AMBULATORY_CARE_PROVIDER_SITE_OTHER): Payer: Medicare Other | Admitting: Nurse Practitioner

## 2011-05-09 VITALS — BP 118/76 | HR 78 | Ht 60.0 in | Wt 126.8 lb

## 2011-05-09 DIAGNOSIS — R54 Age-related physical debility: Secondary | ICD-10-CM | POA: Insufficient documentation

## 2011-05-09 DIAGNOSIS — I10 Essential (primary) hypertension: Secondary | ICD-10-CM

## 2011-05-09 DIAGNOSIS — R609 Edema, unspecified: Secondary | ICD-10-CM

## 2011-05-09 LAB — BASIC METABOLIC PANEL
BUN: 23 mg/dL (ref 6–23)
CO2: 33 mEq/L — ABNORMAL HIGH (ref 19–32)
Calcium: 10.2 mg/dL (ref 8.4–10.5)
Chloride: 96 mEq/L (ref 96–112)
Creatinine, Ser: 0.9 mg/dL (ref 0.4–1.2)
GFR: 60.51 mL/min (ref >60.00)
Glucose, Bld: 142 mg/dL — ABNORMAL HIGH (ref 70–99)
Potassium: 3.7 mEq/L (ref 3.5–5.1)
Sodium: 137 mEq/L (ref 135–145)

## 2011-05-09 NOTE — Progress Notes (Signed)
Brenda Cook Date of Birth: Jun 09, 1921   History of Present Illness: Brenda Cook is seen today for  4 month follow up visit. She is seen for Dr. Patty Sermons. She has had a good 4 months since her last visit. She is using a walker. No falls. No chest pain. She thinks she is doing pretty well for her age. She will have some occasional edema. She is not able to wear support stockings. She is taking both HTCT and Lasix. I think she misunderstood the instructions for her Lasix at her last visit. We will need to check labs today. She tries to eat foods with potassium. Pacemaker is checked in device clinic.   Current Outpatient Prescriptions on File Prior to Visit  Medication Sig Dispense Refill  . aspirin 81 MG tablet Take 81 mg by mouth daily.        . cholecalciferol (VITAMIN D) 1000 UNITS tablet Take 1,000 Units by mouth daily.        . Cyanocobalamin (B-12 PO) Take 1 tablet by mouth daily.        . diphenhydrAMINE (BENADRYL) 25 MG tablet Take 25 mg by mouth as needed.        Marland Kitchen esomeprazole (NEXIUM) 40 MG capsule Take 40 mg by mouth daily before breakfast.        . fish oil-omega-3 fatty acids 1000 MG capsule Take 1 g by mouth daily.        . furosemide (LASIX) 20 MG tablet Take 1 tablet (20 mg total) by mouth daily.  90 tablet  3  . hydrochlorothiazide 25 MG tablet Take 1 tablet (25 mg total) by mouth daily.  90 tablet  3  . metoprolol succinate (TOPROL-XL) 25 MG 24 hr tablet Take 12.5 mg by mouth daily.        . Multiple Vitamins-Minerals (EYE VITAMINS) CAPS Take 1 capsule by mouth daily.        . multivitamin (THERAGRAN) per tablet Take 1 tablet by mouth daily.        . sertraline (ZOLOFT) 50 MG tablet Take 75 mg by mouth daily.          No Known Allergies  Past Medical History  Diagnosis Date  . Hypertension   . Hypertensive cardiovascular disease   . Peripheral edema   . Chronic cough   . GERD (gastroesophageal reflux disease)   . Arthritis   . Atrial fibrillation   . S/P cardiac  pacemaker procedure     Has old pacemaker on right side that is turned off and new unit on left    Past Surgical History  Procedure Date  . Partial hysterectomy   . Appendectomy   . Tonsillectomy and adenoidectomy   . Insert / replace / remove pacemaker 7/10  . Insert / replace / remove pacemaker 5/79  . Insert / replace / remove pacemaker 7/95  . Shoulder surgery     History  Smoking status  . Never Smoker   Smokeless tobacco  . Not on file    History  Alcohol Use No    Family History  Problem Relation Age of Onset  . Stroke Father   . Heart failure Mother   . Leukemia Sister     Review of Systems: The review of systems is positive for occasional edema.  All other systems were reviewed and are negative.  Physical Exam: BP 118/76  Pulse 78  Ht 5' (1.524 m)  Wt 126 lb 12.8 oz (57.516 kg)  BMI 24.76 kg/m2 Patient is very pleasant and in no acute distress. She looks younger than her stated age. Skin is warm and dry. Color is normal.  HEENT is unremarkable. Normocephalic/atraumatic. PERRL. Sclera are nonicteric. Neck is supple. No masses. No JVD. Lungs are clear. Cardiac exam shows a regular rate and rhythm. She has pacemakers noted on right and left upper chest. Abdomen is soft. Extremities are without edema. Gait and ROM are intact. No gross neurologic deficits noted.   LABORATORY DATA:   Assessment / Plan:

## 2011-05-09 NOTE — Patient Instructions (Signed)
Stay on your current medicines  I will have you see Dr. Patty Sermons in about 4 months We are going to check your potassium and kidney function today Call for any problems.

## 2011-05-09 NOTE — Assessment & Plan Note (Signed)
Blood pressure looks good. No change with her meds.

## 2011-05-09 NOTE — Assessment & Plan Note (Signed)
Overall, she is doing well. No change with her meds. Will see her back in 4 months.

## 2011-05-09 NOTE — Assessment & Plan Note (Signed)
She is on both HCTZ and Lasix. We will check a BMET and see what her labs look like. Swelling seems ok with this regimen.

## 2011-05-12 ENCOUNTER — Telehealth: Payer: Self-pay | Admitting: *Deleted

## 2011-05-12 NOTE — Telephone Encounter (Signed)
Advised of lab results 

## 2011-05-22 ENCOUNTER — Encounter: Payer: MEDICARE | Admitting: *Deleted

## 2011-05-27 ENCOUNTER — Encounter: Payer: Self-pay | Admitting: *Deleted

## 2011-06-02 ENCOUNTER — Other Ambulatory Visit: Payer: Self-pay

## 2011-06-02 ENCOUNTER — Ambulatory Visit (INDEPENDENT_AMBULATORY_CARE_PROVIDER_SITE_OTHER): Payer: Medicare Other | Admitting: *Deleted

## 2011-06-02 ENCOUNTER — Encounter: Payer: Self-pay | Admitting: Internal Medicine

## 2011-06-02 DIAGNOSIS — Z950 Presence of cardiac pacemaker: Secondary | ICD-10-CM

## 2011-06-02 DIAGNOSIS — I495 Sick sinus syndrome: Secondary | ICD-10-CM

## 2011-06-03 LAB — REMOTE PACEMAKER DEVICE
AL THRESHOLD: 0.625 V
BAMS-0001: 175 {beats}/min
RV LEAD IMPEDENCE PM: 478 Ohm
RV LEAD THRESHOLD: 0.75 V

## 2011-06-04 NOTE — Progress Notes (Signed)
Pacer remote check  

## 2011-06-13 ENCOUNTER — Telehealth: Payer: Self-pay | Admitting: Cardiology

## 2011-06-13 NOTE — Telephone Encounter (Signed)
Advised daughter 

## 2011-06-13 NOTE — Telephone Encounter (Signed)
How many days prior to d/c ASA 81 mg daily?

## 2011-06-13 NOTE — Telephone Encounter (Signed)
Called because she is having surgeyry on her wrist and they need to know if and when  she can stop taking her aspirin Please call back. I have pulled the chart.

## 2011-06-13 NOTE — Telephone Encounter (Signed)
She may stop it 5 days prior to surgery

## 2011-06-17 ENCOUNTER — Encounter: Payer: Self-pay | Admitting: *Deleted

## 2011-06-19 ENCOUNTER — Telehealth: Payer: Self-pay | Admitting: Cardiology

## 2011-06-19 NOTE — Telephone Encounter (Signed)
Faxed last OV, last EKG, last ECHO, last Stress, and last Chest Xray to [phone] today

## 2011-06-19 NOTE — Telephone Encounter (Signed)
Needs faxed Last OV , EKG, STRESS, Echo, Chest xray if at all available, pt coming tomorrow for pre op

## 2011-06-20 ENCOUNTER — Encounter (HOSPITAL_BASED_OUTPATIENT_CLINIC_OR_DEPARTMENT_OTHER)
Admission: RE | Admit: 2011-06-20 | Discharge: 2011-06-20 | Disposition: A | Discharge status: Home / Self Care | Payer: Medicare Other | Source: Ambulatory Visit | Attending: Orthopedic Surgery | Admitting: Orthopedic Surgery

## 2011-06-20 ENCOUNTER — Other Ambulatory Visit: Payer: Self-pay | Admitting: Orthopedic Surgery

## 2011-06-20 ENCOUNTER — Ambulatory Visit
Admission: RE | Admit: 2011-06-20 | Discharge: 2011-06-20 | Disposition: A | Discharge status: Home / Self Care | Payer: Medicare Other | Source: Ambulatory Visit | Attending: Orthopedic Surgery | Admitting: Orthopedic Surgery

## 2011-06-20 DIAGNOSIS — Z01811 Encounter for preprocedural respiratory examination: Secondary | ICD-10-CM

## 2011-06-20 LAB — BASIC METABOLIC PANEL
Calcium: 10.5 mg/dL (ref 8.4–10.5)
Creatinine, Ser: 0.64 mg/dL (ref 0.50–1.10)
GFR calc Af Amer: >60 mL/min (ref >60)

## 2011-06-24 ENCOUNTER — Ambulatory Visit (HOSPITAL_BASED_OUTPATIENT_CLINIC_OR_DEPARTMENT_OTHER)
Admission: RE | Admit: 2011-06-24 | Discharge: 2011-06-24 | Disposition: A | Discharge status: Home / Self Care | Payer: Medicare Other | Source: Ambulatory Visit | Attending: Orthopedic Surgery | Admitting: Orthopedic Surgery

## 2011-06-24 DIAGNOSIS — Z01812 Encounter for preprocedural laboratory examination: Secondary | ICD-10-CM | POA: Insufficient documentation

## 2011-06-24 DIAGNOSIS — I10 Essential (primary) hypertension: Secondary | ICD-10-CM | POA: Insufficient documentation

## 2011-06-24 DIAGNOSIS — G5600 Carpal tunnel syndrome, unspecified upper limb: Secondary | ICD-10-CM | POA: Insufficient documentation

## 2011-06-24 DIAGNOSIS — I4891 Unspecified atrial fibrillation: Secondary | ICD-10-CM | POA: Insufficient documentation

## 2011-06-24 DIAGNOSIS — Z950 Presence of cardiac pacemaker: Secondary | ICD-10-CM | POA: Insufficient documentation

## 2011-06-25 ENCOUNTER — Other Ambulatory Visit: Payer: Self-pay | Admitting: Cardiology

## 2011-06-25 DIAGNOSIS — I119 Hypertensive heart disease without heart failure: Secondary | ICD-10-CM

## 2011-06-25 LAB — POCT HEMOGLOBIN-HEMACUE: Hemoglobin: 11.8 g/dL — ABNORMAL LOW (ref 12.0–15.0)

## 2011-07-24 ENCOUNTER — Other Ambulatory Visit: Payer: Self-pay | Admitting: Family Medicine

## 2011-07-24 DIAGNOSIS — R1084 Generalized abdominal pain: Secondary | ICD-10-CM

## 2011-07-28 ENCOUNTER — Ambulatory Visit
Admission: RE | Admit: 2011-07-28 | Discharge: 2011-07-28 | Disposition: A | Discharge status: Home / Self Care | Payer: Medicare Other | Source: Ambulatory Visit | Attending: Family Medicine | Admitting: Family Medicine

## 2011-07-28 DIAGNOSIS — R1084 Generalized abdominal pain: Secondary | ICD-10-CM

## 2011-07-28 MED ORDER — IOHEXOL 300 MG/ML  SOLN
100.0000 mL | Freq: Once | INTRAMUSCULAR | Status: AC | PRN
  Administered 2011-07-28: 100 mL via INTRAVENOUS

## 2011-07-29 NOTE — Op Note (Signed)
  [name], Cook NO.:  [id number]  MEDICAL RECORD NO.:  [id number]  LOCATION:                                 FACILITY:  PHYSICIAN:  Cindee Salt, M.D.       DATE OF BIRTH:  Jul 24, 1921  DATE OF PROCEDURE:  06/24/2011 DATE OF DISCHARGE:                              OPERATIVE REPORT   PREOPERATIVE DIAGNOSIS:  Carpal tunnel syndrome, left hand.  POSTOPERATIVE DIAGNOSIS:  Carpal tunnel syndrome, left hand.  OPERATION:  Decompression, left median nerve.  SURGEON:  Cindee Salt, M.D.  ANESTHESIA:  Forearm based IV regional with local infiltration.  DATE OF OPERATION:  June 24, 2011.  ANESTHESIOLOGIST:  Quita Skye. Krista Blue, M.D.  HISTORY:  The patient is an 77 year old female with a history of carpal tunnel syndrome, EMG nerve conduction is positive.  This has not responded to conservative treatment.  Pre, peri, postoperative course have been discussed along with risks and complications.  She is aware that there is no guarantee with the surgery, possibility of infection, recurrence injury to arteries, nerves, tendons, incomplete relief of symptoms, dystrophy.  In the preoperative area, the patient is seen. The extremity marked by both the patient and surgeon.  Antibiotic given.  PROCEDURE IN DETAIL:  The patient is brought to the operating room where a forearm based IV regional anesthetic was carried out without difficulty.  She was prepped using ChloraPrep, supine position with the left arm free.  A 3-minute dry time was allowed.  Time-out taken confirming the patient and procedure.  A longitudinal incision was made in the palm, carried down through subcutaneous tissue.  Bleeders were electrocauterized.  Palmar fascia was split, superficial palmar arch identified.  The flexor tendon to the ring little finger identified, to the ulnar side of median nerve.  The carpal retinaculum was incised with sharp dissection.  A right-angle and Sewall retractors were  placed between skin and forearm fascia.  The fascia released for approximately a centimeter and half proximal to the wrist crease under direct vision. Area of compression to the nerve was apparent.  No further lesions were identified.  The wound was irrigated.  Skin closed with interrupted 4-0 Vicryl Rapide sutures.  Sterile compressive dressing was applied with the fingers free.  No splint was used.  On deflation of the tourniquet all fingers immediately pinked.  She was taken to the recovery room for observation in satisfactory condition. She will be discharged home to return to the [facility] [place] in 1 week on Vicodin.          ______________________________ Cindee Salt, M.D.     GK/MEDQ  D:  06/24/2011  T:  06/24/2011  Job:  [id number]  Electronically Signed by Cindee Salt M.D. on 07/29/2011 04:39:06 PM

## 2011-08-06 ENCOUNTER — Other Ambulatory Visit: Payer: Self-pay | Admitting: Gastroenterology

## 2011-08-08 ENCOUNTER — Ambulatory Visit
Admission: RE | Admit: 2011-08-08 | Discharge: 2011-08-08 | Disposition: A | Discharge status: Home / Self Care | Payer: Medicare Other | Source: Ambulatory Visit | Attending: Gastroenterology | Admitting: Gastroenterology

## 2011-09-10 ENCOUNTER — Ambulatory Visit (INDEPENDENT_AMBULATORY_CARE_PROVIDER_SITE_OTHER): Payer: Medicare Other | Admitting: Cardiology

## 2011-09-10 ENCOUNTER — Encounter: Payer: Self-pay | Admitting: Cardiology

## 2011-09-10 VITALS — BP 110/78 | HR 84 | Ht 62.0 in | Wt 118.0 lb

## 2011-09-10 DIAGNOSIS — E876 Hypokalemia: Secondary | ICD-10-CM

## 2011-09-10 DIAGNOSIS — K219 Gastro-esophageal reflux disease without esophagitis: Secondary | ICD-10-CM

## 2011-09-10 DIAGNOSIS — I119 Hypertensive heart disease without heart failure: Secondary | ICD-10-CM

## 2011-09-10 DIAGNOSIS — I10 Essential (primary) hypertension: Secondary | ICD-10-CM

## 2011-09-10 DIAGNOSIS — I495 Sick sinus syndrome: Secondary | ICD-10-CM

## 2011-09-10 DIAGNOSIS — R609 Edema, unspecified: Secondary | ICD-10-CM

## 2011-09-10 NOTE — Progress Notes (Signed)
Brenda Cook Date of Birth:  May 25, 1921 [facility] / [facility] [address]   [address] [place], [place]  [address] [phone]           Fax   [phone]  History of Present Illness: This pleasant 77 year old woman is seen for a scheduled four-month followup office visit.  She has a history of this symptomatic bradycardia and has a pacemaker in place.  She actually has 2 pacemakers.  The wound on the right is inactive and the one on the left it is her more recent pacemaker.  Her first pacemaker was implanted in 1995 and her second in July of 2010.  Her indication was sinoatrial node disease.  He has not been experiencing any syncopal episodes.  She has had occasional lightheadedness.  She does complain of malaise and fatigue.  She's not having any peripheral edema.  Her weight is down 8 pounds since last visit  Current Outpatient Prescriptions  Medication Sig Dispense Refill  . aspirin 81 MG tablet Take 81 mg by mouth daily.        . cholecalciferol (VITAMIN D) 1000 UNITS tablet Take 1,000 Units by mouth daily.        . Cyanocobalamin (B-12 PO) Take 1 tablet by mouth daily.        . diphenhydrAMINE (BENADRYL) 25 MG tablet Take 25 mg by mouth as needed.        . fish oil-omega-3 fatty acids 1000 MG capsule Take 1 g by mouth daily.        . furosemide (LASIX) 20 MG tablet Take 20 mg by mouth as directed. 1 every other day alternating with your hydrochlorothiazide       . hydrochlorothiazide (HYDRODIURIL) 25 MG tablet Take 25 mg by mouth as directed. 1 every other day alternating with your furosemide       . metoprolol succinate (TOPROL-XL) 25 MG 24 hr tablet TAKE ONE-HALF (1/2) TABLET DAILY  45 tablet  3  . Multiple Vitamins-Minerals (EYE VITAMINS) CAPS Take 1 capsule by mouth daily.        . multivitamin (THERAGRAN) per tablet Take 1 tablet by mouth daily.        Bertram Gala Glycol-Propyl Glycol (SYSTANE OP) Apply to eye. As directed       . sertraline (ZOLOFT) 50 MG  tablet Take 75 mg by mouth daily.        Marland Kitchen DISCONTD: furosemide (LASIX) 20 MG tablet Take 1 tablet (20 mg total) by mouth daily.  90 tablet  3  . DISCONTD: hydrochlorothiazide 25 MG tablet Take 1 tablet (25 mg total) by mouth daily.  90 tablet  3  . pantoprazole (PROTONIX) 40 MG tablet Take 40 mg by mouth Daily.        Allergies  Allergen Reactions  . Benicar (Olmesartan Medoxomil)     Patient Active Problem List  Diagnoses  . HYPERTENSION, UNSPECIFIED  . SICK SINUS/ TACHY-BRADY SYNDROME  . PACEMAKER  . Advanced age  . Edema    History  Smoking status  . Never Smoker   Smokeless tobacco  . Not on file    History  Alcohol Use No    Family History  Problem Relation Age of Onset  . Stroke Father   . Heart failure Mother   . Leukemia Sister     Review of Systems: Constitutional: no fever chills diaphoresis or fatigue or change in weight.  Head and neck: no hearing loss, no epistaxis, no photophobia or  visual disturbance. Respiratory: No cough, shortness of breath or wheezing. Cardiovascular: No chest pain peripheral edema, palpitations. Gastrointestinal: No abdominal distention, no abdominal pain, no change in bowel habits hematochezia or melena. Genitourinary: No dysuria, no frequency, no urgency, no nocturia. Musculoskeletal:No arthralgias, no back pain, no gait disturbance or myalgias. Neurological: No dizziness, no headaches, no numbness, no seizures, no syncope, no weakness, no tremors. Hematologic: No lymphadenopathy, no easy bruising. Psychiatric: No confusion, no hallucinations, no sleep disturbance.    Physical Exam: Filed Vitals:   09/10/11 1546  BP: 110/78  Pulse: 84   the general appearance reveals an elderly woman in no acute distress.Pupils equal and reactive.   Extraocular Movements are full.  There is no scleral icterus.  The mouth and pharynx are normal.  The neck is supple.  The carotids reveal no bruits.  The jugular venous pressure is normal.   The thyroid is not enlarged.  There is no lymphadenopathy.  The chest is clear to percussion and auscultation. There are no rales or rhonchi. Expansion of the chest is symmetrical.  The precordium is quiet.  The first heart sound is normal.  The second heart sound is physiologically split.  There is no murmur gallop rub or click.  There is no abnormal lift or heave.  The abdomen is soft and nontender. Bowel sounds are normal. The liver and spleen are not enlarged. There Are no abdominal masses. There are no bruits.  Extremities reveal no phlebitis or edema.Strength is normal and symmetrical in all extremities.  There is no lateralizing weakness.  There are no sensory deficits.  The skin is warm and dry.  There is no rash.    Assessment / Plan: Blood work today is pending.  Reduced doses of diuretics.  Recheck in 4 months for followup office visit and lab work.

## 2011-09-10 NOTE — Assessment & Plan Note (Signed)
The blood pressure is running in the low normal range.

## 2011-09-10 NOTE — Patient Instructions (Addendum)
Will obtain labs today and call with the results Alternate your lasix and your hydrochlorothiazide  Your physician recommends that you schedule a follow-up appointment in: 4 months

## 2011-09-10 NOTE — Assessment & Plan Note (Signed)
The patient has been on both daily Lasix and daily hydrochlorothiazide.  The last blood work in the chart shows a potassium of 3.1.  We are going to reduce her diuretics and have her alternate Lasix one day and hydrochlorothiazide the next.  We are checking a basal metabolic panel today.  The patient has not been experiencing any recent peripheral edema and her weight is down

## 2011-09-11 ENCOUNTER — Telehealth: Payer: Self-pay | Admitting: *Deleted

## 2011-09-11 DIAGNOSIS — E876 Hypokalemia: Secondary | ICD-10-CM

## 2011-09-11 LAB — BASIC METABOLIC PANEL
CO2: 32 mEq/L (ref 19–32)
Chloride: 96 mEq/L (ref 96–112)
Potassium: 2.9 mEq/L — ABNORMAL LOW (ref 3.5–5.1)

## 2011-09-11 NOTE — Telephone Encounter (Signed)
Potassium 2.9 spoke wih Dr Myrtis Ser and will Rx potassium 40 meq x 3 days and then decrease to 20 meq daily.  Advised daughter-recheck potassium next week

## 2011-09-17 ENCOUNTER — Other Ambulatory Visit (INDEPENDENT_AMBULATORY_CARE_PROVIDER_SITE_OTHER): Payer: Medicare Other | Admitting: *Deleted

## 2011-09-17 DIAGNOSIS — E876 Hypokalemia: Secondary | ICD-10-CM

## 2011-09-17 LAB — BASIC METABOLIC PANEL
CO2: 29 mEq/L (ref 19–32)
Glucose, Bld: 101 mg/dL — ABNORMAL HIGH (ref 70–99)
Potassium: 3.7 mEq/L (ref 3.5–5.1)
Sodium: 138 mEq/L (ref 135–145)

## 2011-09-17 MED ORDER — POTASSIUM CHLORIDE CRYS ER 20 MEQ PO TBCR: EXTENDED_RELEASE_TABLET | ORAL | Status: DC

## 2011-09-19 ENCOUNTER — Telehealth: Payer: Self-pay | Admitting: *Deleted

## 2011-09-19 NOTE — Telephone Encounter (Signed)
Message copied by Burnell Blanks on Fri Sep 19, 2011 12:02 PM ------      Message from: Cassell Clement      Created: Wed Sep 17, 2011  9:16 PM       Please report.  The labs are stable.  Continue same meds.  Continue careful diet.

## 2011-09-19 NOTE — Telephone Encounter (Signed)
Left message to continue same dose of medications and call if any questions

## 2011-09-23 ENCOUNTER — Telehealth: Payer: Self-pay | Admitting: Cardiology

## 2011-09-23 NOTE — Telephone Encounter (Signed)
Stop the Lasix and just use hydrochlorothiazide 25 mg every other day.  If she starts to have edema we will have to increase her diuretics again

## 2011-09-23 NOTE — Telephone Encounter (Signed)
New problem Pt's daughter called She wants to talk to you about meds She is getting up 5 times a night Please call

## 2011-09-23 NOTE — Telephone Encounter (Signed)
Daughter calling again because she has not heard anything from you yet and wanted to make sure we put the correct number in and we did please give her a call today

## 2011-09-23 NOTE — Telephone Encounter (Signed)
Spoke with pt dtr, pt is currently taking lasix 20 mg alternating with HCTZ 25 mg. She is getting up several times during the night to use the bathroom. The dtr states the edema is better and the pt has a little SOB with exertion. She wonders if pt is on too much diuretic. Pt will also change and take potassium in the morning instead of night. She will watch her fluids after 7 pm. Will forward for dr Patty Sermons review Deliah Goody

## 2011-09-23 NOTE — Telephone Encounter (Signed)
Advised daughter of change and to call back if any swelling/edema

## 2011-10-08 ENCOUNTER — Encounter: Payer: Medicare Other | Admitting: Internal Medicine

## 2011-10-14 ENCOUNTER — Ambulatory Visit
Admission: RE | Admit: 2011-10-14 | Discharge: 2011-10-14 | Disposition: A | Discharge status: Home / Self Care | Payer: Medicare Other | Source: Ambulatory Visit | Attending: Family Medicine | Admitting: Family Medicine

## 2011-10-14 ENCOUNTER — Other Ambulatory Visit: Payer: Self-pay | Admitting: Family Medicine

## 2011-10-14 DIAGNOSIS — R05 Cough: Secondary | ICD-10-CM

## 2011-12-24 ENCOUNTER — Ambulatory Visit (INDEPENDENT_AMBULATORY_CARE_PROVIDER_SITE_OTHER): Payer: Medicare Other | Admitting: Internal Medicine

## 2011-12-24 ENCOUNTER — Encounter: Payer: Self-pay | Admitting: Internal Medicine

## 2011-12-24 DIAGNOSIS — Z950 Presence of cardiac pacemaker: Secondary | ICD-10-CM

## 2011-12-24 DIAGNOSIS — I495 Sick sinus syndrome: Secondary | ICD-10-CM

## 2011-12-24 DIAGNOSIS — I442 Atrioventricular block, complete: Secondary | ICD-10-CM

## 2011-12-24 LAB — PACEMAKER DEVICE OBSERVATION
AL THRESHOLD: 0.4 V
ATRIAL PACING PM: 90
BATTERY VOLTAGE: 2.79 V
VENTRICULAR PACING PM: 100

## 2011-12-24 NOTE — Progress Notes (Signed)
PCP:  [name], MD, MD Primary Cardiologist:  Dr Patty Sermons  The patient presents today for routine electrophysiology followup.  Since last being seen in our clinic, the patient reports doing very well.  She remains active despite her age.  Today, she denies symptoms of palpitations, chest pain, shortness of breath, orthopnea, PND, lower extremity edema, dizziness, presyncope, syncope, or neurologic sequela.  The patient feels that she is tolerating medications without difficulties and is otherwise without complaint today.   Past Medical History  Diagnosis Date  . Hypertension   . Hypertensive cardiovascular disease   . Peripheral edema   . Chronic cough   . GERD (gastroesophageal reflux disease)   . Arthritis   . Atrial fibrillation   . Complete heart block     Has old Thera pacemaker on right side that is turned off and new MDT Versa unit on left   Past Surgical History  Procedure Date  . Partial hysterectomy   . Appendectomy   . Tonsillectomy and adenoidectomy   . Insert / replace / remove pacemaker 7/10  . Insert / replace / remove pacemaker 5/79  . Insert / replace / remove pacemaker 7/95  . Shoulder surgery     Current Outpatient Prescriptions  Medication Sig Dispense Refill  . aspirin 81 MG tablet Take 81 mg by mouth daily.        Marland Kitchen azelastine (OPTIVAR) 0.05 % ophthalmic solution Place 1 drop into both eyes 2 (two) times daily.      . calcium citrate-vitamin D (CITRACAL+D) 315-200 MG-UNIT per tablet Take 1 tablet by mouth 2 (two) times daily.      . cholecalciferol (VITAMIN D) 1000 UNITS tablet Take 1,000 Units by mouth daily.        . Cyanocobalamin (B-12 PO) Take 1 tablet by mouth daily.        . diphenhydrAMINE (BENADRYL) 25 MG tablet Take 25 mg by mouth as needed.        Marland Kitchen esomeprazole (NEXIUM) 40 MG capsule Take 40 mg by mouth daily before breakfast.      . fish oil-omega-3 fatty acids 1000 MG capsule Take 1 g by mouth daily.        . hydrochlorothiazide  (HYDRODIURIL) 25 MG tablet Take 25 mg by mouth as directed. 1 every other day alternating with your furosemide       . metoprolol succinate (TOPROL-XL) 25 MG 24 hr tablet TAKE ONE-HALF (1/2) TABLET DAILY  45 tablet  3  . Multiple Vitamins-Minerals (EYE VITAMINS) CAPS Take 1 capsule by mouth daily.        . multivitamin (THERAGRAN) per tablet Take 1 tablet by mouth daily.        Bertram Gala Glycol-Propyl Glycol (SYSTANE OP) Apply to eye. As directed       . potassium chloride SA (K-DUR,KLOR-CON) 20 MEQ tablet 2 tablets daily x 2 days and then 1 daily or as directed  45 tablet  11  . sertraline (ZOLOFT) 50 MG tablet Take 75 mg by mouth daily.        . vitamin C (ASCORBIC ACID) 500 MG tablet Take 500 mg by mouth daily.      Marland Kitchen DISCONTD: hydrochlorothiazide 25 MG tablet Take 1 tablet (25 mg total) by mouth daily.  90 tablet  3    Allergies  Allergen Reactions  . Benicar (Olmesartan Medoxomil)     History   Social History  . Marital Status: Widowed    Spouse Name: N/A  Number of Children: N/A  . Years of Education: N/A   Occupational History  . Not on file.   Social History Main Topics  . Smoking status: Never Smoker   . Smokeless tobacco: Not on file  . Alcohol Use: No  . Drug Use: No  . Sexually Active: No   Other Topics Concern  . Not on file   Social History Narrative  . No narrative on file    Family History  Problem Relation Age of Onset  . Stroke Father   . Heart failure Mother   . Leukemia Sister     Physical Exam: Filed Vitals:   12/24/11 1459  BP: 138/80  Pulse: 93  Height: 5\' 2"  (1.575 m)  Weight: 128 lb (58.06 kg)    GEN- The patient is elderly appearing, alert and oriented x 3 today.   Head- normocephalic, atraumatic Eyes-  Sclera clear, conjunctiva pink Ears- hearing intact Oropharynx- clear Neck- supple, no JVP Lymph- no cervical lymphadenopathy Lungs- Clear to ausculation bilaterally, normal work of breathing Chest- pacemaker pocket is well  healed Heart- Regular rate and rhythm, no murmurs, rubs or gallops, PMI not laterally displaced GI- soft, NT, ND, + BS Extremities- no clubbing, cyanosis, or edema  Pacemaker interrogation- reviewed in detail today,  See PACEART report  Assessment and Plan:

## 2011-12-24 NOTE — Patient Instructions (Addendum)
Your physician wants you to follow-up in: 6 months with device clinic.  You will receive a reminder letter in the mail two months in advance. If you don't receive a letter, please call our office to schedule the follow-up appointment.  Your physician wants you to follow-up in: 12 months with Dr. Johney Frame. You will receive a reminder letter in the mail two months in advance. If you don't receive a letter, please call our office to schedule the follow-up appointment.

## 2011-12-24 NOTE — Assessment & Plan Note (Signed)
Normal pacemaker function See Pace Art report No changes today  

## 2012-01-16 ENCOUNTER — Other Ambulatory Visit: Payer: Medicare Other

## 2012-01-16 ENCOUNTER — Encounter: Payer: Self-pay | Admitting: Cardiology

## 2012-01-16 ENCOUNTER — Ambulatory Visit (INDEPENDENT_AMBULATORY_CARE_PROVIDER_SITE_OTHER): Payer: Medicare Other | Admitting: Cardiology

## 2012-01-16 VITALS — BP 114/86 | Ht 61.0 in | Wt 128.0 lb

## 2012-01-16 DIAGNOSIS — D649 Anemia, unspecified: Secondary | ICD-10-CM

## 2012-01-16 DIAGNOSIS — R609 Edema, unspecified: Secondary | ICD-10-CM

## 2012-01-16 DIAGNOSIS — Z950 Presence of cardiac pacemaker: Secondary | ICD-10-CM

## 2012-01-16 DIAGNOSIS — I495 Sick sinus syndrome: Secondary | ICD-10-CM

## 2012-01-16 DIAGNOSIS — I119 Hypertensive heart disease without heart failure: Secondary | ICD-10-CM

## 2012-01-16 DIAGNOSIS — E876 Hypokalemia: Secondary | ICD-10-CM

## 2012-01-16 LAB — CBC WITH DIFFERENTIAL/PLATELET
Basophils Relative: 1 % (ref 0–1)
HCT: 37 % (ref 36.0–46.0)
Hemoglobin: 12.2 g/dL (ref 12.0–15.0)
MCHC: 33 g/dL (ref 30.0–36.0)
Monocytes Absolute: 0.6 10*3/uL (ref 0.1–1.0)
Monocytes Relative: 9 % (ref 3–12)
Neutro Abs: 4.7 10*3/uL (ref 1.7–7.7)

## 2012-01-16 LAB — BASIC METABOLIC PANEL
CO2: 28 mEq/L (ref 19–32)
Calcium: 10.2 mg/dL (ref 8.4–10.5)
Chloride: 104 mEq/L (ref 96–112)
Potassium: 4.7 mEq/L (ref 3.5–5.3)
Sodium: 139 mEq/L (ref 135–145)

## 2012-01-16 NOTE — Assessment & Plan Note (Signed)
The patient has not been experiencing any dizzy spells or syncope

## 2012-01-16 NOTE — Progress Notes (Signed)
Brenda Cook Date of Birth:  Dec 13, 1920 [facility] [address] [place], [place]  [address] [phone]         Fax   [phone]  History of Present Illness: This pleasant 78 year old woman is seen for a scheduled one month followup office visit.  She has a past history of symptomatic bradycardia and has a functioning pacemaker in place.  She has had a pacemaker since 1995.  He has had a problem with essential hypertension and hypokalemia.  Since last visit she has been feeling better.  We reduced her diuretics at her last visit and she has been able to gain 10 pounds and feels better and has not been experiencing any peripheral edema or signs of fluid overload.  Current Outpatient Prescriptions  Medication Sig Dispense Refill  . aspirin 81 MG tablet Take 81 mg by mouth daily.        Marland Kitchen azelastine (OPTIVAR) 0.05 % ophthalmic solution Place 1 drop into both eyes 2 (two) times daily.      . calcium citrate-vitamin D (CITRACAL+D) 315-200 MG-UNIT per tablet Take 1 tablet by mouth 2 (two) times daily.      . cholecalciferol (VITAMIN D) 1000 UNITS tablet Take 1,000 Units by mouth daily.        . Cyanocobalamin (B-12 PO) Take 1 tablet by mouth daily.        . diphenhydrAMINE (BENADRYL) 25 MG tablet Take 25 mg by mouth as needed.        Marland Kitchen esomeprazole (NEXIUM) 40 MG capsule Take 40 mg by mouth daily before breakfast.      . fish oil-omega-3 fatty acids 1000 MG capsule Take 1 g by mouth daily.        . hydrochlorothiazide (HYDRODIURIL) 25 MG tablet Take 25 mg by mouth every other day.       . metoprolol succinate (TOPROL-XL) 25 MG 24 hr tablet TAKE ONE-HALF (1/2) TABLET DAILY  45 tablet  3  . Multiple Vitamins-Minerals (EYE VITAMINS) CAPS Take 1 capsule by mouth daily.        . multivitamin (THERAGRAN) per tablet Take 1 tablet by mouth daily.        Bertram Gala Glycol-Propyl Glycol (SYSTANE OP) Apply to eye. As directed       . potassium chloride SA (K-DUR,KLOR-CON) 20  MEQ tablet Taking 1 daily      . sertraline (ZOLOFT) 50 MG tablet Take 75 mg by mouth daily.        . vitamin C (ASCORBIC ACID) 500 MG tablet Take 500 mg by mouth daily.      Marland Kitchen DISCONTD: hydrochlorothiazide 25 MG tablet Take 1 tablet (25 mg total) by mouth daily.  90 tablet  3    Allergies  Allergen Reactions  . Benicar (Olmesartan Medoxomil)     Patient Active Problem List  Diagnoses  . HYPERTENSION, UNSPECIFIED  . SICK SINUS/ TACHY-BRADY SYNDROME  . PACEMAKER  . Advanced age  . Edema  . Complete heart block    History  Smoking status  . Never Smoker   Smokeless tobacco  . Not on file    History  Alcohol Use No    Family History  Problem Relation Age of Onset  . Stroke Father   . Heart failure Mother   . Leukemia Sister     Review of Systems: Constitutional: no fever chills diaphoresis or fatigue or change in weight.  Head and neck: no hearing loss, no epistaxis, no photophobia or  visual disturbance. Respiratory: No cough, shortness of breath or wheezing. Cardiovascular: No chest pain peripheral edema, palpitations. Gastrointestinal: No abdominal distention, no abdominal pain, no change in bowel habits hematochezia or melena. Genitourinary: No dysuria, no frequency, no urgency, no nocturia. Musculoskeletal:No arthralgias, no back pain, no gait disturbance or myalgias. Neurological: No dizziness, no headaches, no numbness, no seizures, no syncope, no weakness, no tremors. Hematologic: No lymphadenopathy, no easy bruising. Psychiatric: No confusion, no hallucinations, no sleep disturbance.    Physical Exam: Filed Vitals:   01/16/12 1502  BP: 114/86   the general appearance reveals an alert elderly woman in no distress.Pupils equal and reactive.   Extraocular Movements are full.  There is no scleral icterus.  The mouth and pharynx are normal.  The neck is supple.  The carotids reveal no bruits.  The jugular venous pressure is normal.  The thyroid is not  enlarged.  There is no lymphadenopathy.  The chest is clear to percussion and auscultation. There are no rales or rhonchi. Expansion of the chest is symmetrical.  Thorax reveals that she has a pacemaker in the right chest which is nonfunctional and she has a pacemaker in the left upper chest which is the new functioning pacemaker.  No murmur gallop or rub.The abdomen is soft and nontender. Bowel sounds are normal. The liver and spleen are not enlarged. There Are no abdominal masses. There are no bruits.  The pedal pulses are good.  There is no phlebitis or edema.  There is no cyanosis or clubbing. Strength is normal and symmetrical in all extremities.  There is no lateralizing weakness.  There are no sensory deficits.     Assessment / Plan: The patient is to continue her current diuretic therapy which she is to take hydrochlorothiazide 25 mg every other day and she is not taking any furosemide at all.  She is taking metoprolol 25 mg one half tablet daily as directed.  We're checking lab work today including a basal metabolic panel to check on sodium and potassium and a CBC to followup on her previous anemia.  She'll return in 4 months for followup office visit and basal metabolic panel and EKG

## 2012-01-16 NOTE — Patient Instructions (Signed)
Will obtain labs today and call you with the results (cbc/bmet)  Your physician recommends that you continue on your current medications as directed. Please refer to the Current Medication list given to you today.  Your physician wants you to follow-up in: 4 months You will receive a reminder letter in the mail two months in advance. If you don't receive a letter, please call our office to schedule the follow-up appointment.  

## 2012-01-16 NOTE — Assessment & Plan Note (Signed)
The patient has not been experiencing any peripheral edema even though her weight is up 10 pounds.

## 2012-01-16 NOTE — Assessment & Plan Note (Signed)
Patient has not been experiencing any awareness of her heart beating fast or irregular.  She has a past history of tachybradycardia syndrome.

## 2012-01-22 ENCOUNTER — Telehealth: Payer: Self-pay | Admitting: *Deleted

## 2012-01-22 NOTE — Telephone Encounter (Signed)
Advised daughter 

## 2012-01-22 NOTE — Telephone Encounter (Signed)
Message copied by Burnell Blanks on Thu Jan 22, 2012  4:48 PM ------      Message from: Cassell Clement      Created: Fri Jan 16, 2012  9:50 PM       Please report.  Hgb is better. Kidneys are drier so decrease HCTZ to just half a tablet every other day.

## 2012-02-13 IMAGING — CR DG CHEST 2V
2 series · 2 of 2 positions shown · non-contrast
Comparison: Chest x-ray of 06/20/2011

CLINICAL DATA: Cough for 6 weeks

CHEST - 2 VIEW

[view not recorded (1 of 2)]
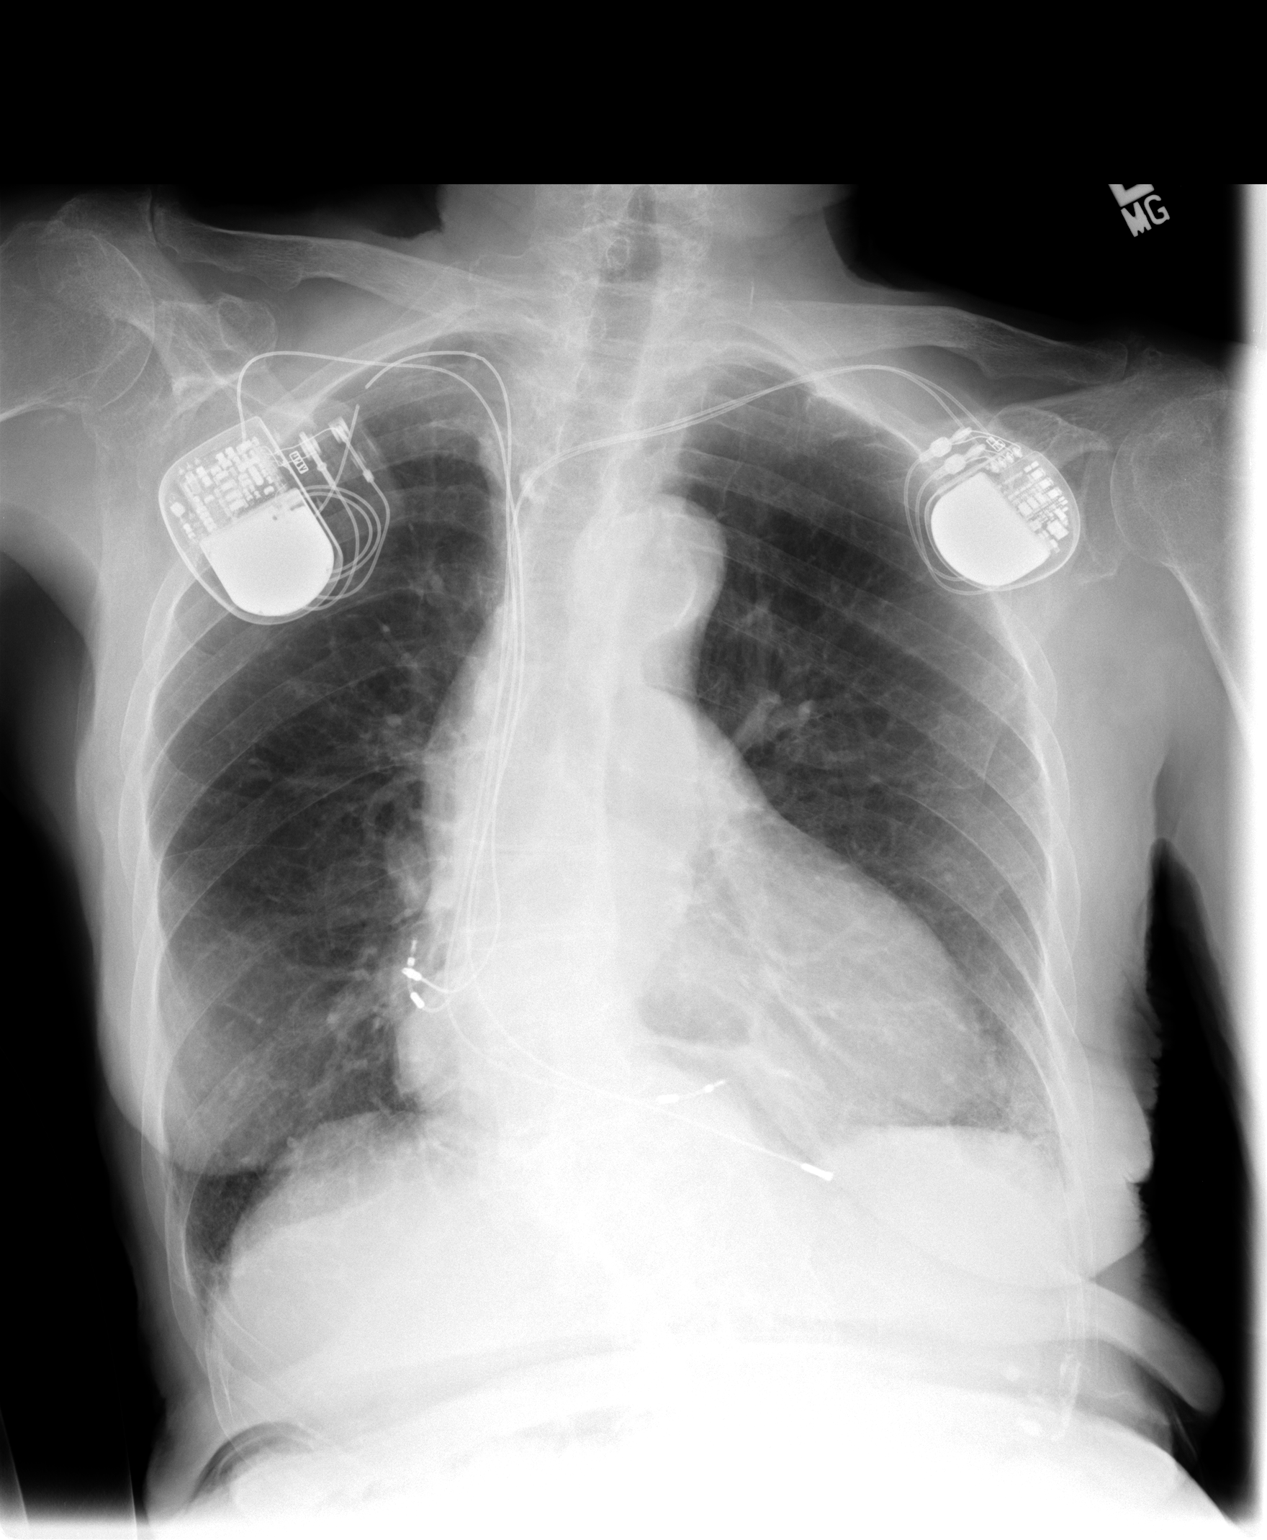

[view not recorded (2 of 2)]
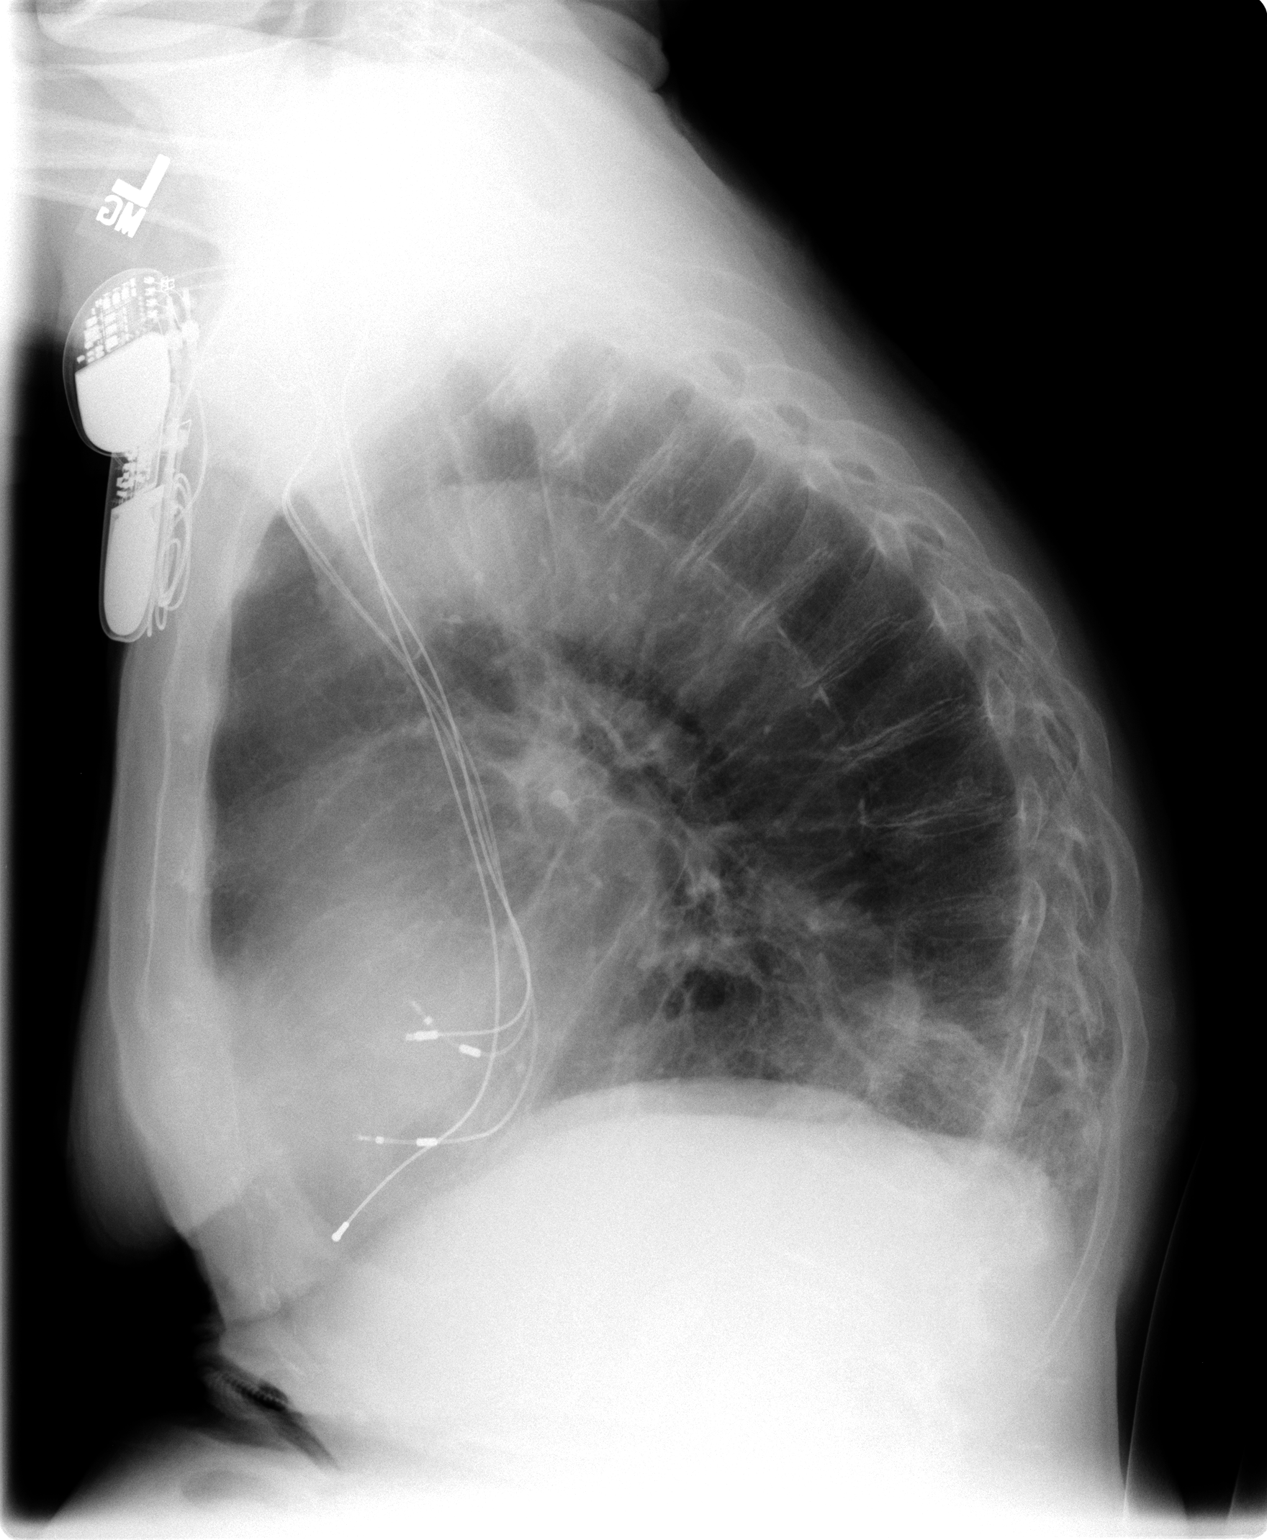

[2 of 2 positions shown; findings below may reference images not displayed]

FINDINGS: There are slightly more prominent markings at the left
lung base particularly posteriorly on the lateral view suspicious
for pneumonia.  The right lung is clear and the lungs remain
hyperaerated.  Cardiomegaly is stable.  A small hiatal hernia
remains.  Permanent pacemaker is unchanged.  One of the leads from
the right-sided pacer is fractured as noted previously.
Thoracolumbar scoliosis is noted with diffuse osteopenia.
IMPRESSION: 1.  Prominent markings posteriorly at the left lung base on the
lateral view.  Possible pneumonia.  Consider follow-up.
2.  Stable cardiomegaly and small hiatal hernia.
3.  Thoracolumbar scoliosis with osteopenia. No change in fractured
pacer lead from right sided pacer pack.

## 2012-04-12 ENCOUNTER — Other Ambulatory Visit: Payer: Self-pay | Admitting: Cardiology

## 2012-04-12 NOTE — Telephone Encounter (Signed)
Refilled furosemide

## 2012-04-21 ENCOUNTER — Other Ambulatory Visit: Payer: Self-pay | Admitting: Cardiology

## 2012-04-21 NOTE — Telephone Encounter (Signed)
Refilled hctz 

## 2012-05-20 ENCOUNTER — Other Ambulatory Visit: Payer: Self-pay | Admitting: Cardiology

## 2012-06-03 ENCOUNTER — Encounter: Payer: Self-pay | Admitting: Internal Medicine

## 2012-06-03 ENCOUNTER — Telehealth: Payer: Self-pay | Admitting: Internal Medicine

## 2012-06-03 ENCOUNTER — Ambulatory Visit (INDEPENDENT_AMBULATORY_CARE_PROVIDER_SITE_OTHER): Payer: Medicare Other | Admitting: *Deleted

## 2012-06-03 DIAGNOSIS — I442 Atrioventricular block, complete: Secondary | ICD-10-CM

## 2012-06-03 LAB — PACEMAKER DEVICE OBSERVATION
ATRIAL PACING PM: 64
BAMS-0001: 175 {beats}/min
RV LEAD IMPEDENCE PM: 445 Ohm
VENTRICULAR PACING PM: 100

## 2012-06-03 NOTE — Telephone Encounter (Signed)
Left a message to call back.

## 2012-06-03 NOTE — Progress Notes (Signed)
PPM check 

## 2012-06-03 NOTE — Telephone Encounter (Signed)
New Problem:    Patient's daughter called in because her mother thinks that one of the wires came loose form her device.  Also there is a small protrusion that can be felt through the skin right above her device location.  Please call back.

## 2012-06-03 NOTE — Telephone Encounter (Signed)
Patient is concerned about a knot like feeling around her pacemaker, ? Wire?  We will see her today @ 4pm.

## 2012-06-11 ENCOUNTER — Other Ambulatory Visit: Payer: Self-pay | Admitting: Cardiology

## 2012-06-11 MED ORDER — POTASSIUM CHLORIDE CRYS ER 20 MEQ PO TBCR: 20.0000 meq | EXTENDED_RELEASE_TABLET | Freq: Every day | ORAL | Status: DC

## 2012-07-14 NOTE — Progress Notes (Signed)
Pt lives with daughter-will bring her for ekg and bmet-

## 2012-07-16 ENCOUNTER — Other Ambulatory Visit: Payer: Self-pay

## 2012-07-16 ENCOUNTER — Encounter (HOSPITAL_BASED_OUTPATIENT_CLINIC_OR_DEPARTMENT_OTHER)
Admission: RE | Admit: 2012-07-16 | Discharge: 2012-07-16 | Disposition: A | Discharge status: Home / Self Care | Payer: Medicare Other | Source: Ambulatory Visit | Attending: Orthopedic Surgery | Admitting: Orthopedic Surgery

## 2012-07-16 ENCOUNTER — Other Ambulatory Visit: Payer: Self-pay | Admitting: Orthopedic Surgery

## 2012-07-16 LAB — BASIC METABOLIC PANEL
Chloride: 103 mEq/L (ref 96–112)
GFR calc Af Amer: 84 mL/min — ABNORMAL LOW (ref >90)
Potassium: 4.6 mEq/L (ref 3.5–5.1)
Sodium: 140 mEq/L (ref 135–145)

## 2012-07-20 ENCOUNTER — Encounter (HOSPITAL_BASED_OUTPATIENT_CLINIC_OR_DEPARTMENT_OTHER): Payer: Self-pay | Admitting: Orthopedic Surgery

## 2012-07-20 ENCOUNTER — Encounter (HOSPITAL_BASED_OUTPATIENT_CLINIC_OR_DEPARTMENT_OTHER): Payer: Self-pay

## 2012-07-20 ENCOUNTER — Ambulatory Visit (HOSPITAL_BASED_OUTPATIENT_CLINIC_OR_DEPARTMENT_OTHER): Payer: Medicare Other | Admitting: Anesthesiology

## 2012-07-20 ENCOUNTER — Encounter (HOSPITAL_BASED_OUTPATIENT_CLINIC_OR_DEPARTMENT_OTHER)
Admission: RE | Disposition: A | Discharge status: Home / Self Care | Payer: Self-pay | Source: Ambulatory Visit | Attending: Orthopedic Surgery

## 2012-07-20 ENCOUNTER — Encounter (HOSPITAL_BASED_OUTPATIENT_CLINIC_OR_DEPARTMENT_OTHER): Payer: Self-pay | Admitting: Anesthesiology

## 2012-07-20 ENCOUNTER — Ambulatory Visit (HOSPITAL_BASED_OUTPATIENT_CLINIC_OR_DEPARTMENT_OTHER)
Admission: RE | Admit: 2012-07-20 | Discharge: 2012-07-20 | Disposition: A | Discharge status: Home / Self Care | Payer: Medicare Other | Source: Ambulatory Visit | Attending: Orthopedic Surgery | Admitting: Orthopedic Surgery

## 2012-07-20 DIAGNOSIS — Z01810 Encounter for preprocedural cardiovascular examination: Secondary | ICD-10-CM | POA: Insufficient documentation

## 2012-07-20 DIAGNOSIS — Z01812 Encounter for preprocedural laboratory examination: Secondary | ICD-10-CM | POA: Insufficient documentation

## 2012-07-20 DIAGNOSIS — Z950 Presence of cardiac pacemaker: Secondary | ICD-10-CM | POA: Insufficient documentation

## 2012-07-20 DIAGNOSIS — I10 Essential (primary) hypertension: Secondary | ICD-10-CM | POA: Insufficient documentation

## 2012-07-20 DIAGNOSIS — M24443 Recurrent dislocation, unspecified hand: Secondary | ICD-10-CM | POA: Insufficient documentation

## 2012-07-20 HISTORY — PX: TENDON REPAIR: SHX5111

## 2012-07-20 LAB — POCT HEMOGLOBIN-HEMACUE: Hemoglobin: 15.9 g/dL — ABNORMAL HIGH (ref 12.0–15.0)

## 2012-07-20 SURGERY — TENDON REPAIR
Anesthesia: Monitor Anesthesia Care | Site: Hand | Laterality: Left | Wound class: Clean

## 2012-07-20 MED ORDER — FENTANYL CITRATE 0.05 MG/ML IJ SOLN
INTRAMUSCULAR | Status: DC | PRN
  Administered 2012-07-20: 30 ug via INTRAVENOUS

## 2012-07-20 MED ORDER — ROPIVACAINE HCL 5 MG/ML IJ SOLN
INTRAMUSCULAR | Status: DC | PRN
  Administered 2012-07-20: 150 mg via EPIDURAL

## 2012-07-20 MED ORDER — CEFAZOLIN SODIUM-DEXTROSE 2-3 GM-% IV SOLR
2.0000 g | INTRAVENOUS | Status: AC
  Administered 2012-07-20: 2 g via INTRAVENOUS

## 2012-07-20 MED ORDER — PROPOFOL INFUSION 10 MG/ML OPTIME
INTRAVENOUS | Status: DC | PRN
  Administered 2012-07-20: 50 ug/kg/min via INTRAVENOUS

## 2012-07-20 MED ORDER — DEXAMETHASONE SODIUM PHOSPHATE 4 MG/ML IJ SOLN
INTRAMUSCULAR | Status: DC | PRN
  Administered 2012-07-20: 4 mg via INTRAVENOUS

## 2012-07-20 MED ORDER — HYDROCODONE-ACETAMINOPHEN 5-500 MG PO TABS: 1.0000 | ORAL_TABLET | ORAL | Status: AC | PRN

## 2012-07-20 MED ORDER — CHLORHEXIDINE GLUCONATE 4 % EX LIQD: 60.0000 mL | Freq: Once | CUTANEOUS | Status: DC

## 2012-07-20 MED ORDER — ONDANSETRON HCL 4 MG/2ML IJ SOLN
INTRAMUSCULAR | Status: DC | PRN
  Administered 2012-07-20: 4 mg via INTRAVENOUS

## 2012-07-20 MED ORDER — LACTATED RINGERS IV SOLN
INTRAVENOUS | Status: DC
  Administered 2012-07-20 (×2): via INTRAVENOUS

## 2012-07-20 MED ORDER — FENTANYL CITRATE 0.05 MG/ML IJ SOLN
100.0000 ug | Freq: Once | INTRAMUSCULAR | Status: AC
  Administered 2012-07-20: 100 ug via INTRAVENOUS

## 2012-07-20 SURGICAL SUPPLY — 82 items
BAG DECANTER FOR FLEXI CONT (MISCELLANEOUS) IMPLANT
BALL CTTN LRG ABS STRL LF (GAUZE/BANDAGES/DRESSINGS)
BANDAGE GAUZE ELAST BULKY 4 IN (GAUZE/BANDAGES/DRESSINGS) ×2 IMPLANT
BLADE MINI RND TIP GREEN BEAV (BLADE) ×1 IMPLANT
BLADE SURG 15 STRL LF DISP TIS (BLADE) ×1 IMPLANT
BLADE SURG 15 STRL SS (BLADE) ×2
BNDG CMPR 9X4 STRL LF SNTH (GAUZE/BANDAGES/DRESSINGS) ×1
BNDG COHESIVE 3X5 TAN STRL LF (GAUZE/BANDAGES/DRESSINGS) ×2 IMPLANT
BNDG ESMARK 4X9 LF (GAUZE/BANDAGES/DRESSINGS) ×1 IMPLANT
CHLORAPREP W/TINT 26ML (MISCELLANEOUS) ×2 IMPLANT
CLOTH BEACON ORANGE TIMEOUT ST (SAFETY) ×2 IMPLANT
CORDS BIPOLAR (ELECTRODE) ×2 IMPLANT
COTTONBALL LRG STERILE PKG (GAUZE/BANDAGES/DRESSINGS) IMPLANT
COVER MAYO STAND STRL (DRAPES) ×2 IMPLANT
COVER TABLE BACK 60X90 (DRAPES) ×2 IMPLANT
CUFF TOURNIQUET SINGLE 18IN (TOURNIQUET CUFF) IMPLANT
DECANTER SPIKE VIAL GLASS SM (MISCELLANEOUS) IMPLANT
DRAIN TLS ROUND 10FR (DRAIN) IMPLANT
DRAPE EXTREMITY T 121X128X90 (DRAPE) ×2 IMPLANT
DRAPE OEC MINIVIEW 54X84 (DRAPES) IMPLANT
DRAPE SURG 17X23 STRL (DRAPES) ×2 IMPLANT
DRSG KUZMA FLUFF (GAUZE/BANDAGES/DRESSINGS) IMPLANT
GAUZE SPONGE 4X4 16PLY XRAY LF (GAUZE/BANDAGES/DRESSINGS) IMPLANT
GAUZE XEROFORM 1X8 LF (GAUZE/BANDAGES/DRESSINGS) ×2 IMPLANT
GLOVE BIO SURGEON STRL SZ 6.5 (GLOVE) ×2 IMPLANT
GLOVE BIOGEL M STRL SZ7.5 (GLOVE) ×2 IMPLANT
GLOVE BIOGEL PI IND STRL 7.0 (GLOVE) IMPLANT
GLOVE BIOGEL PI IND STRL 8 (GLOVE) ×1 IMPLANT
GLOVE BIOGEL PI IND STRL 8.5 (GLOVE) IMPLANT
GLOVE BIOGEL PI INDICATOR 7.0 (GLOVE) ×1
GLOVE BIOGEL PI INDICATOR 8 (GLOVE) ×1
GLOVE BIOGEL PI INDICATOR 8.5 (GLOVE) ×1
GLOVE SURG ORTHO 8.0 STRL STRW (GLOVE) ×2 IMPLANT
GOWN BRE IMP PREV XXLGXLNG (GOWN DISPOSABLE) ×3 IMPLANT
GOWN PREVENTION PLUS XLARGE (GOWN DISPOSABLE) ×2 IMPLANT
KWIRE 4.0 X .035IN (WIRE) IMPLANT
LOOP VESSEL MAXI BLUE (MISCELLANEOUS) IMPLANT
NDL KEITH (NEEDLE) IMPLANT
NEEDLE 27GAX1X1/2 (NEEDLE) ×2 IMPLANT
NEEDLE HYPO 22GX1.5 SAFETY (NEEDLE) IMPLANT
NEEDLE KEITH (NEEDLE) IMPLANT
NS IRRIG 1000ML POUR BTL (IV SOLUTION) ×2 IMPLANT
PACK BASIN DAY SURGERY FS (CUSTOM PROCEDURE TRAY) ×2 IMPLANT
PAD CAST 3X4 CTTN HI CHSV (CAST SUPPLIES) ×1 IMPLANT
PADDING CAST ABS 3INX4YD NS (CAST SUPPLIES)
PADDING CAST ABS 4INX4YD NS (CAST SUPPLIES)
PADDING CAST ABS COTTON 3X4 (CAST SUPPLIES) IMPLANT
PADDING CAST ABS COTTON 4X4 ST (CAST SUPPLIES) ×1 IMPLANT
PADDING CAST COTTON 3X4 STRL (CAST SUPPLIES) ×2
SLEEVE SCD COMPRESS KNEE MED (MISCELLANEOUS) ×1 IMPLANT
SPLINT PLASTER CAST XFAST 3X15 (CAST SUPPLIES) IMPLANT
SPLINT PLASTER XTRA FASTSET 3X (CAST SUPPLIES) ×10
SPONGE GAUZE 4X4 12PLY (GAUZE/BANDAGES/DRESSINGS) ×2 IMPLANT
STOCKINETTE 4X48 STRL (DRAPES) ×2 IMPLANT
SUT CHROMIC 5 0 P 3 (SUTURE) IMPLANT
SUT ETHIBOND 3-0 V-5 (SUTURE) IMPLANT
SUT FIBERWIRE 2-0 18 17.9 3/8 (SUTURE)
SUT FIBERWIRE 4-0 18 TAPR NDL (SUTURE)
SUT MERSILENE 2.0 SH NDLE (SUTURE) IMPLANT
SUT MERSILENE 3 0 FS 1 (SUTURE) IMPLANT
SUT MERSILENE 4 0 P 3 (SUTURE) ×1 IMPLANT
SUT POLY BUTTON 15MM (SUTURE) IMPLANT
SUT PROLENE 2 0 SH DA (SUTURE) IMPLANT
SUT SILK 2 0 FS (SUTURE) IMPLANT
SUT SILK 4 0 PS 2 (SUTURE) IMPLANT
SUT STEEL 3 0 (SUTURE) IMPLANT
SUT STEEL 4 0 V 26 (SUTURE) IMPLANT
SUT VIC AB 3-0 PS1 18 (SUTURE)
SUT VIC AB 3-0 PS1 18XBRD (SUTURE) IMPLANT
SUT VIC AB 4-0 P-3 18XBRD (SUTURE) IMPLANT
SUT VIC AB 4-0 P3 18 (SUTURE)
SUT VICRYL 4-0 PS2 18IN ABS (SUTURE) IMPLANT
SUT VICRYL RAPID 5 0 P 3 (SUTURE) ×2 IMPLANT
SUT VICRYL RAPIDE 4/0 PS 2 (SUTURE) ×1 IMPLANT
SUTURE FIBERWR 2-0 18 17.9 3/8 (SUTURE) IMPLANT
SUTURE FIBERWR 4-0 18 TAPR NDL (SUTURE) IMPLANT
SYR BULB 3OZ (MISCELLANEOUS) ×2 IMPLANT
SYR CONTROL 10ML LL (SYRINGE) IMPLANT
TOWEL OR 17X24 6PK STRL BLUE (TOWEL DISPOSABLE) ×4 IMPLANT
TUBE FEEDING 5FR 15 INCH (TUBING) IMPLANT
UNDERPAD 30X30 INCONTINENT (UNDERPADS AND DIAPERS) ×2 IMPLANT
WATER STERILE IRR 1000ML POUR (IV SOLUTION) ×1 IMPLANT

## 2012-07-20 NOTE — Transfer of Care (Signed)
Immediate Anesthesia Transfer of Care Note  Patient: Brenda Cook  Procedure(s) Performed: Procedure(s) (LRB) with comments: TENDON REPAIR (Left) - Reconstruction Extensor hood Left middle finger and left ring finger  Patient Location: PACU  Anesthesia Type: MAC combined with regional for post-op pain  Level of Consciousness: awake, alert  and oriented  Airway & Oxygen Therapy: Patient Spontanous Breathing and Patient connected to face mask oxygen  Post-op Assessment: Report given to PACU RN and Post -op Vital signs reviewed and stable  Post vital signs: Reviewed and stable  Complications: No apparent anesthesia complications

## 2012-07-20 NOTE — Brief Op Note (Signed)
07/20/2012  11:31 AM  PATIENT:  Brenda Cook  78 y.o. female  PRE-OPERATIVE DIAGNOSIS:  subluxation ext tendons LMF, LRF  POST-OPERATIVE DIAGNOSIS:  * No post-op diagnosis entered *  PROCEDURE:  Procedure(s) (LRB) with comments: TENDON REPAIR (Left) - Reconstruction Extensor hood Left middle finger and left ring finger  SURGEON:  Surgeon(s) and Role:    * Nicki Reaper, MD - Primary    * Tami Ribas, MD - Assisting  PHYSICIAN ASSISTANT:   ASSISTANTS: Karlyn Agee, MD   ANESTHESIA:   regional and general  EBL:     BLOOD ADMINISTERED:none  DRAINS: none   LOCAL MEDICATIONS USED:  NONE  SPECIMEN:  No Specimen  DISPOSITION OF SPECIMEN:  N/A  COUNTS:  YES  TOURNIQUET:   Total Tourniquet Time Documented: Upper Arm (Left) - 45 minutes  DICTATION: .Other Dictation: Dictation Number [id number]  PLAN OF CARE: Discharge to home after PACU  PATIENT DISPOSITION:  PACU - hemodynamically stable.

## 2012-07-20 NOTE — H&P (Signed)
Brenda Cook is 78 yo female with a new problem.  For the past several months she has begun having catching of her left middle and ring fingers.  She has no history of injury. She complains of an occasional pain, catching when she uses it.  It does not awaken her at night.  She is not complaining of any numbness or tingling. She has subluxating extensor tendons middle, ring fingers left hand.    ALLERGIES:  None.   MEDICATIONS:  Nexium, Zoloft, HCTZ, amlodipine, metoprolol, aspirin; over-the-counter includes Citracal, Eye caps, B-12, multivitamins, Vitamin C, fish oil, Vitamin D3, Benadryl, Optiva and Systane .  SURGICAL HISTORY:  Hysterectomy, pacemaker, carpal tunnel releases, cataract surgery.  FAMILY MEDICAL HISTORY:  Positive for diabetes and arthritis.  SOCIAL HISTORY:  She does not smoke or drink.    REVIEW OF SYSTEMS:    Positive for glasses, hearing loss, high blood pressure, cataracts, stroke, easy bruising, otherwise negative 14 points.  Brenda Cook is an 78 y.o. female.   Chief Complaint: subluxating extensor tendons LMF / LRF HPI: see above  Past Medical History  Diagnosis Date  . Hypertension   . Hypertensive cardiovascular disease   . Peripheral edema   . Chronic cough   . GERD (gastroesophageal reflux disease)   . Arthritis   . Atrial fibrillation   . Complete heart block     Has old Thera pacemaker on right side that is turned off and new MDT Versa unit on left    Past Surgical History  Procedure Date  . Partial hysterectomy   . Appendectomy   . Tonsillectomy and adenoidectomy   . Insert / replace / remove pacemaker 7/10  . Insert / replace / remove pacemaker 5/79  . Insert / replace / remove pacemaker 7/95  . Shoulder surgery     Family History  Problem Relation Age of Onset  . Stroke Father   . Heart failure Mother   . Leukemia Sister    Social History:  reports that she has never smoked. She does not have any smokeless tobacco history on file.  She reports that she does not drink alcohol or use illicit drugs.  Allergies:  Allergies  Allergen Reactions  . Benicar (Olmesartan Medoxomil)     Medications Prior to Admission  Medication Sig Dispense Refill  . aspirin 81 MG tablet Take 81 mg by mouth daily.        Marland Kitchen azelastine (OPTIVAR) 0.05 % ophthalmic solution Place 1 drop into both eyes 2 (two) times daily.      . calcium citrate-vitamin D (CITRACAL+D) 315-200 MG-UNIT per tablet Take 1 tablet by mouth 2 (two) times daily.      . cholecalciferol (VITAMIN D) 1000 UNITS tablet Take 1,000 Units by mouth daily.        . Cyanocobalamin (B-12 PO) Take 1 tablet by mouth daily.        . diphenhydrAMINE (BENADRYL) 25 MG tablet Take 25 mg by mouth as needed.        Marland Kitchen esomeprazole (NEXIUM) 40 MG capsule Take 40 mg by mouth daily before breakfast.      . fish oil-omega-3 fatty acids 1000 MG capsule Take 1 g by mouth daily.        . hydrochlorothiazide (HYDRODIURIL) 25 MG tablet TAKE 1 TABLET DAILY  90 tablet  3  . metoprolol succinate (TOPROL-XL) 25 MG 24 hr tablet TAKE ONE-HALF (1/2) TABLET DAILY  45 tablet  3  . Multiple Vitamins-Minerals (EYE VITAMINS)  CAPS Take 1 capsule by mouth daily.        . multivitamin (THERAGRAN) per tablet Take 1 tablet by mouth daily.        Bertram Gala Glycol-Propyl Glycol (SYSTANE OP) Apply to eye. As directed       . potassium chloride SA (K-DUR,KLOR-CON) 20 MEQ tablet Take 1 tablet (20 mEq total) by mouth daily.  30 tablet  5  . sertraline (ZOLOFT) 50 MG tablet Take 75 mg by mouth daily.        . vitamin C (ASCORBIC ACID) 500 MG tablet Take 500 mg by mouth daily.        No results found for this or any previous visit (from the past 48 hour(s)).  No results found.   Pertinent items are noted in HPI.  Blood pressure 154/88, pulse 81, temperature 97.4 F (36.3 C), temperature source Oral, resp. rate 16, height 5\' 1"  (1.549 m), weight 58.06 kg (128 lb), SpO2 96.00%.  General appearance: alert, cooperative  and appears stated age Head: Normocephalic, without obvious abnormality, atraumatic Neck: no adenopathy Resp: clear to auscultation bilaterally Cardio: regular rate and rhythm, S1, S2 normal, no murmur, click, rub or gallop GI: soft, non-tender; bowel sounds normal; no masses,  no organomegaly Extremities: extremities normal, atraumatic, no cyanosis or edema Pulses: 2+ and symmetric Skin: Skin color, texture, turgor normal. No rashes or lesions Neurologic: Grossly normal Incision/Wound: na  Assessment/Plan DX subluxating extensor tendons   She is desirous of proceeding to have these reconstructed.    The pre, peri and postoperative course were discussed along with the risks and complications.  She is scheduled for reconstruction extensor hood left middle, left ring fingers as an outpatient.   Kalaysia Demonbreun R 07/20/2012, 9:24 AM

## 2012-07-20 NOTE — Anesthesia Postprocedure Evaluation (Signed)
Anesthesia Post Note  Patient: Brenda Cook  Procedure(s) Performed: Procedure(s) (LRB): TENDON REPAIR (Left)  Anesthesia type: MAC  Patient location: PACU  Post pain: Pain level controlled  Post assessment: Patient's Cardiovascular Status Stable  Last Vitals:  Filed Vitals:   07/20/12 1245  BP: 168/84  Pulse: 70  Temp:   Resp: 19    Post vital signs: Reviewed and stable  Level of consciousness: sedated  Complications: No apparent anesthesia complications

## 2012-07-20 NOTE — Progress Notes (Signed)
Assisted Dr. Singer with left, ultrasound guided, supraclavicular block. Side rails up, monitors on throughout procedure. See vital signs in flow sheet. Tolerated Procedure well. 

## 2012-07-20 NOTE — Anesthesia Procedure Notes (Addendum)
Anesthesia Regional Block:  Supraclavicular block  Pre-Anesthetic Checklist: ,, timeout performed, Correct Patient, Correct Site, Correct Laterality, Correct Procedure,, site marked, risks and benefits discussed, Surgical consent,  Pre-op evaluation,  At surgeon's request and post-op pain management  Laterality: Left  Prep: chloraprep       Needles:  Injection technique: Single-shot  Needle Type: Echogenic Stimulator Needle     Needle Length: 5cm 5 cm Needle Gauge: 22 and 22 G    Additional Needles:  Procedures: ultrasound guided and nerve stimulator Supraclavicular block  Nerve Stimulator or Paresthesia:  Response: bicep contraction, 0.45 mA,   Additional Responses:   Narrative:  Start time: 07/20/2012 10:03 AM End time: 07/20/2012 10:14 AM Injection made incrementally with aspirations every 5 mL.  Performed by: Personally  Anesthesiologist: J. Adonis Huguenin, MD  Additional Notes: Functioning IV was confirmed and monitors applied.  A 50mm 22ga echogenic arrow stimulator was used. Sterile prep and drape,hand hygiene and sterile gloves were used.Ultrasound guidance: relevant anatomy identified, needle position confirmed, local anesthetic spread visualized around nerve(s)., vascular puncture avoided.  Image printed for medical record.  Negative aspiration and negative test dose prior to incremental administration of local anesthetic. The patient tolerated the procedure well.  Interscalene brachial plexus block Procedure Name: MAC Date/Time: 07/20/2012 10:26 AM Performed by: Tawona Filsinger D Pre-anesthesia Checklist: Patient identified, Emergency Drugs available, Suction available, Patient being monitored and Timeout performed Patient Re-evaluated:Patient Re-evaluated prior to inductionOxygen Delivery Method: Simple face mask

## 2012-07-20 NOTE — Anesthesia Preprocedure Evaluation (Addendum)
Anesthesia Evaluation  Patient identified by MRN, date of birth, ID band Patient awake    Reviewed: Allergy & Precautions, H&P , NPO status , Patient's Chart, lab work & pertinent test results  History of Anesthesia Complications Negative for: history of anesthetic complications  Airway Mallampati: II TM Distance: >3 FB Neck ROM: Full    Dental  (+) Teeth Intact and Dental Advisory Given   Pulmonary neg pulmonary ROS,    Pulmonary exam normal       Cardiovascular hypertension, Pt. on medications + dysrhythmias + pacemaker Rhythm:Regular Rate:Normal  Pacer dependent, changed to doo   Neuro/Psych    GI/Hepatic Neg liver ROS,   Endo/Other  negative endocrine ROS  Renal/GU negative Renal ROS     Musculoskeletal   Abdominal   Peds  Hematology   Anesthesia Other Findings   Reproductive/Obstetrics                          Anesthesia Physical Anesthesia Plan  ASA: III  Anesthesia Plan: MAC   Post-op Pain Management:    Induction: Intravenous  Airway Management Planned: Simple Face Mask  Additional Equipment:   Intra-op Plan:   Post-operative Plan:   Informed Consent: I have reviewed the patients History and Physical, chart, labs and discussed the procedure including the risks, benefits and alternatives for the proposed anesthesia with the patient or authorized representative who has indicated his/her understanding and acceptance.   Dental advisory given  Plan Discussed with: CRNA, Anesthesiologist and Surgeon  Anesthesia Plan Comments:         Anesthesia Quick Evaluation

## 2012-07-20 NOTE — Op Note (Signed)
Dictation Number [id number]

## 2012-07-21 ENCOUNTER — Encounter (HOSPITAL_BASED_OUTPATIENT_CLINIC_OR_DEPARTMENT_OTHER): Payer: Self-pay | Admitting: Orthopedic Surgery

## 2012-07-21 NOTE — Op Note (Signed)
NAMEFYNLEY, KLEINHENZ NO.:  [id number]  MEDICAL RECORD NO.:  [id number]  LOCATION:                                 FACILITY:  PHYSICIAN:  Cindee Salt, M.D.            DATE OF BIRTH:  DATE OF PROCEDURE:  07/20/2012 DATE OF DISCHARGE:                              OPERATIVE REPORT   PREOPERATIVE DIAGNOSIS:  Subluxation extensor tendons, left middle left ring finger.  POSTOPERATIVE DIAGNOSIS:  Subluxation extensor tendons, left middle left ring finger.  OPERATION:  Reconstruction of sagittal fibers, left middle, left ring finger.  SURGEON:  Cindee Salt, MD  ASSISTANT:  Betha Loa, MD  ANESTHESIA:  Supraclavicular block with general.  ANESTHESIOLOGIST:  Quita Skye. Krista Blue, M.D.  HISTORY:  The patient is an 78 year old female with a history of triggering of her middle and ring fingers, left hand.  This began as a flexor tendon problem with subluxation of the extensors into the ulnar gutter on both fingers.  The trigger fingers, A1 pulley responded to injections.  He is admitted now for centralization of the extensor tendons and reconstruction of the extensor hood, both middle and ring fingers, left hand.  She is aware of risks and complications including infection, recurrence of injury to arteries, nerves, tendons, incomplete relief of symptoms, dystrophy, recurrent subluxation radially.  In the preoperative area, the patient was seen, the extremity marked by both the patient and surgeon.  Antibiotic given.  PROCEDURE:  The patient was brought to the operating room where supraclavicular block had been given in the preoperative area.  A general anesthetic given.  She was prepped using ChloraPrep, supine position, left arm free.  A 3-minute dry time was allowed.  Time-out taken, confirming the patient and procedure.  A longitudinal the limb was exsanguinated with an Esmarch bandage.  Tourniquet placed high and the arm was inflated to 300 mmHg.  The longitudinal  incision was made to the radial aspect of the middle finger, carried down through subcutaneous tissue.  Bleeders were electrocauterized with bipolar.  The dissection carried down to the extensor tendon.  The hood was noted to be virtually non-existent proximally over the radial side and accessory tendon was present volar to the central tendon dorsally.  This attached to the capsule.  This was selected to be used for the reconstruction. This was transected proximally and sutured to the tendon dorsally with a figure-of-eight 4-0 Mersilene sutures.  The tendon slip distally was then passed around the extensor tendon, then volar to the inner metacarpal ligament, brought out dorsally.  This was then sutured to itself in multiple areas but not sutured to the extensor tendon to allow this to glide.  The wound was copiously irrigated with saline.  The skin was closed with interrupted 5-0 Vicryl Rapide sutures.  Separate incision was then made to the ulnar side of the ring finger, carried down through subcutaneous tissue.  A large expansion of extensor tendon of both ring and little finger were present.  It was decided to proceed with a portion of the central aspect of the ring finger and that there was a raphe between 2 of the slips.  The central portion was then harvested as a graft.  The tendon was then repaired with a single 4-0 Mersilene suture.  The tendon slip centrally was then brought around the tendon distally over to the radial gutter, placed volar to the inner metacarpal ligament, brought back dorsally and sutured to itself of the dorsal aspect with figure-of-eight 4-0 Mersilene sutures.  This centralized both tendons with full flexion, extension.  The wounds were irrigated and skin closed with interrupted 5-0 Vicryl Rapide sutures. Sterile compressive dressing and volar splint was applied.  On deflation of the tourniquet, all fingers immediately pinked.  She was taken to the recovery  room for observation.          ______________________________ Cindee Salt, M.D.     GK/MEDQ  D:  07/20/2012  T:  07/21/2012  Job:  [id number]

## 2012-08-04 ENCOUNTER — Other Ambulatory Visit: Payer: Self-pay | Admitting: Cardiology

## 2012-08-04 MED ORDER — POTASSIUM CHLORIDE CRYS ER 20 MEQ PO TBCR: 20.0000 meq | EXTENDED_RELEASE_TABLET | Freq: Every day | ORAL | Status: DC

## 2012-08-04 NOTE — Telephone Encounter (Signed)
New problem:    90 days supply  

## 2012-08-09 ENCOUNTER — Other Ambulatory Visit: Payer: Self-pay | Admitting: Family Medicine

## 2012-08-09 DIAGNOSIS — R220 Localized swelling, mass and lump, head: Secondary | ICD-10-CM

## 2012-08-10 ENCOUNTER — Ambulatory Visit
Admission: RE | Admit: 2012-08-10 | Discharge: 2012-08-10 | Disposition: A | Discharge status: Home / Self Care | Payer: Medicare Other | Source: Ambulatory Visit | Attending: Family Medicine | Admitting: Family Medicine

## 2012-08-10 DIAGNOSIS — R220 Localized swelling, mass and lump, head: Secondary | ICD-10-CM

## 2012-08-10 MED ORDER — IOHEXOL 300 MG/ML  SOLN
75.0000 mL | Freq: Once | INTRAMUSCULAR | Status: AC | PRN
  Administered 2012-08-10: 75 mL via INTRAVENOUS

## 2012-08-23 ENCOUNTER — Other Ambulatory Visit: Payer: Self-pay | Admitting: Cardiology

## 2012-08-23 NOTE — Telephone Encounter (Signed)
New problem  90 day supply.  [facility] script.  [phone]. Option 2.

## 2012-08-25 MED ORDER — POTASSIUM CHLORIDE CRYS ER 20 MEQ PO TBCR: 20.0000 meq | EXTENDED_RELEASE_TABLET | Freq: Every day | ORAL | Status: DC

## 2012-12-06 ENCOUNTER — Other Ambulatory Visit: Payer: Self-pay | Admitting: Orthopedic Surgery

## 2012-12-15 ENCOUNTER — Encounter (HOSPITAL_BASED_OUTPATIENT_CLINIC_OR_DEPARTMENT_OTHER): Payer: Self-pay | Admitting: *Deleted

## 2012-12-15 NOTE — Progress Notes (Signed)
Pt was here for lt hand 9/13-did well-will need istat Pacer orders sent

## 2012-12-16 NOTE — Progress Notes (Signed)
[employer] rep called to come ck pacer per dr allred sheet for surgery

## 2012-12-17 ENCOUNTER — Ambulatory Visit (HOSPITAL_BASED_OUTPATIENT_CLINIC_OR_DEPARTMENT_OTHER): Payer: Medicare Other | Admitting: Anesthesiology

## 2012-12-17 ENCOUNTER — Ambulatory Visit (HOSPITAL_BASED_OUTPATIENT_CLINIC_OR_DEPARTMENT_OTHER)
Admission: RE | Admit: 2012-12-17 | Discharge: 2012-12-17 | Disposition: A | Discharge status: Home / Self Care | Payer: Medicare Other | Source: Ambulatory Visit | Attending: Orthopedic Surgery | Admitting: Orthopedic Surgery

## 2012-12-17 ENCOUNTER — Encounter (HOSPITAL_BASED_OUTPATIENT_CLINIC_OR_DEPARTMENT_OTHER): Payer: Self-pay | Admitting: Anesthesiology

## 2012-12-17 ENCOUNTER — Encounter (HOSPITAL_BASED_OUTPATIENT_CLINIC_OR_DEPARTMENT_OTHER)
Admission: RE | Disposition: A | Discharge status: Home / Self Care | Payer: Self-pay | Source: Ambulatory Visit | Attending: Orthopedic Surgery

## 2012-12-17 ENCOUNTER — Encounter (HOSPITAL_BASED_OUTPATIENT_CLINIC_OR_DEPARTMENT_OTHER): Payer: Self-pay | Admitting: *Deleted

## 2012-12-17 DIAGNOSIS — Z950 Presence of cardiac pacemaker: Secondary | ICD-10-CM | POA: Insufficient documentation

## 2012-12-17 DIAGNOSIS — I119 Hypertensive heart disease without heart failure: Secondary | ICD-10-CM | POA: Insufficient documentation

## 2012-12-17 DIAGNOSIS — M66239 Spontaneous rupture of extensor tendons, unspecified forearm: Secondary | ICD-10-CM | POA: Insufficient documentation

## 2012-12-17 DIAGNOSIS — I4891 Unspecified atrial fibrillation: Secondary | ICD-10-CM | POA: Insufficient documentation

## 2012-12-17 DIAGNOSIS — K219 Gastro-esophageal reflux disease without esophagitis: Secondary | ICD-10-CM | POA: Insufficient documentation

## 2012-12-17 DIAGNOSIS — Z7982 Long term (current) use of aspirin: Secondary | ICD-10-CM | POA: Insufficient documentation

## 2012-12-17 DIAGNOSIS — H9190 Unspecified hearing loss, unspecified ear: Secondary | ICD-10-CM | POA: Insufficient documentation

## 2012-12-17 DIAGNOSIS — R059 Cough, unspecified: Secondary | ICD-10-CM | POA: Insufficient documentation

## 2012-12-17 DIAGNOSIS — R05 Cough: Secondary | ICD-10-CM | POA: Insufficient documentation

## 2012-12-17 DIAGNOSIS — Z79899 Other long term (current) drug therapy: Secondary | ICD-10-CM | POA: Insufficient documentation

## 2012-12-17 DIAGNOSIS — M129 Arthropathy, unspecified: Secondary | ICD-10-CM | POA: Insufficient documentation

## 2012-12-17 DIAGNOSIS — M66249 Spontaneous rupture of extensor tendons, unspecified hand: Secondary | ICD-10-CM | POA: Insufficient documentation

## 2012-12-17 HISTORY — PX: REPAIR EXTENSOR TENDON: SHX5382

## 2012-12-17 LAB — POCT I-STAT, CHEM 8
BUN: 22 mg/dL (ref 6–23)
Chloride: 106 mEq/L (ref 96–112)
Creatinine, Ser: 0.8 mg/dL (ref 0.50–1.10)
Potassium: 4 mEq/L (ref 3.5–5.1)
Sodium: 139 mEq/L (ref 135–145)
TCO2: 25 mmol/L (ref 0–100)

## 2012-12-17 SURGERY — REPAIR, TENDON, EXTENSOR
Anesthesia: General | Site: Finger | Laterality: Right | Wound class: Clean

## 2012-12-17 MED ORDER — LIDOCAINE HCL (CARDIAC) 20 MG/ML IV SOLN
INTRAVENOUS | Status: DC | PRN
  Administered 2012-12-17: 30 mg via INTRAVENOUS

## 2012-12-17 MED ORDER — CEFAZOLIN SODIUM-DEXTROSE 2-3 GM-% IV SOLR: 2.0000 g | INTRAVENOUS | Status: DC

## 2012-12-17 MED ORDER — CEFAZOLIN SODIUM-DEXTROSE 2-3 GM-% IV SOLR
INTRAVENOUS | Status: DC | PRN
  Administered 2012-12-17: 2 g via INTRAVENOUS

## 2012-12-17 MED ORDER — FENTANYL CITRATE 0.05 MG/ML IJ SOLN: 50.0000 ug | INTRAMUSCULAR | Status: DC | PRN

## 2012-12-17 MED ORDER — MIDAZOLAM HCL 2 MG/2ML IJ SOLN: 1.0000 mg | INTRAMUSCULAR | Status: DC | PRN

## 2012-12-17 MED ORDER — PROPOFOL 10 MG/ML IV EMUL
INTRAVENOUS | Status: DC | PRN
  Administered 2012-12-17: 25 ug/kg/min via INTRAVENOUS

## 2012-12-17 MED ORDER — 0.9 % SODIUM CHLORIDE (POUR BTL) OPTIME
TOPICAL | Status: DC | PRN
  Administered 2012-12-17: 1000 mL

## 2012-12-17 MED ORDER — HYDROCODONE-ACETAMINOPHEN 5-325 MG PO TABS: 1.0000 | ORAL_TABLET | Freq: Four times a day (QID) | ORAL | Status: DC | PRN

## 2012-12-17 MED ORDER — METOCLOPRAMIDE HCL 5 MG/ML IJ SOLN: 10.0000 mg | Freq: Once | INTRAMUSCULAR | Status: DC | PRN

## 2012-12-17 MED ORDER — BUPIVACAINE HCL (PF) 0.25 % IJ SOLN
INTRAMUSCULAR | Status: DC | PRN
  Administered 2012-12-17: 8 mL

## 2012-12-17 MED ORDER — CHLORHEXIDINE GLUCONATE 4 % EX LIQD: 60.0000 mL | Freq: Once | CUTANEOUS | Status: DC

## 2012-12-17 MED ORDER — LACTATED RINGERS IV SOLN
INTRAVENOUS | Status: DC
  Administered 2012-12-17 (×3): via INTRAVENOUS

## 2012-12-17 MED ORDER — FENTANYL CITRATE 0.05 MG/ML IJ SOLN
INTRAMUSCULAR | Status: DC | PRN
  Administered 2012-12-17: 25 ug via INTRAVENOUS

## 2012-12-17 MED ORDER — FENTANYL CITRATE 0.05 MG/ML IJ SOLN: 25.0000 ug | INTRAMUSCULAR | Status: DC | PRN

## 2012-12-17 SURGICAL SUPPLY — 77 items
BAG DECANTER FOR FLEXI CONT (MISCELLANEOUS) IMPLANT
BALL CTTN LRG ABS STRL LF (GAUZE/BANDAGES/DRESSINGS)
BANDAGE GAUZE ELAST BULKY 4 IN (GAUZE/BANDAGES/DRESSINGS) ×2 IMPLANT
BLADE MINI RND TIP GREEN BEAV (BLADE) IMPLANT
BLADE SURG 15 STRL LF DISP TIS (BLADE) ×1 IMPLANT
BLADE SURG 15 STRL SS (BLADE) ×2
BNDG CMPR 9X4 STRL LF SNTH (GAUZE/BANDAGES/DRESSINGS)
BNDG COHESIVE 3X5 TAN STRL LF (GAUZE/BANDAGES/DRESSINGS) ×2 IMPLANT
BNDG ESMARK 4X9 LF (GAUZE/BANDAGES/DRESSINGS) IMPLANT
CHLORAPREP W/TINT 26ML (MISCELLANEOUS) ×2 IMPLANT
CLOTH BEACON ORANGE TIMEOUT ST (SAFETY) ×2 IMPLANT
CORDS BIPOLAR (ELECTRODE) ×2 IMPLANT
COTTONBALL LRG STERILE PKG (GAUZE/BANDAGES/DRESSINGS) IMPLANT
COVER MAYO STAND STRL (DRAPES) ×2 IMPLANT
COVER TABLE BACK 60X90 (DRAPES) ×2 IMPLANT
CUFF TOURNIQUET SINGLE 18IN (TOURNIQUET CUFF) ×1 IMPLANT
DECANTER SPIKE VIAL GLASS SM (MISCELLANEOUS) IMPLANT
DRAIN TLS ROUND 10FR (DRAIN) IMPLANT
DRAPE EXTREMITY T 121X128X90 (DRAPE) ×2 IMPLANT
DRAPE OEC MINIVIEW 54X84 (DRAPES) IMPLANT
DRAPE SURG 17X23 STRL (DRAPES) ×2 IMPLANT
DRSG KUZMA FLUFF (GAUZE/BANDAGES/DRESSINGS) IMPLANT
GAUZE SPONGE 4X4 16PLY XRAY LF (GAUZE/BANDAGES/DRESSINGS) IMPLANT
GAUZE XEROFORM 1X8 LF (GAUZE/BANDAGES/DRESSINGS) ×2 IMPLANT
GLOVE BIO SURGEON STRL SZ 6.5 (GLOVE) ×2 IMPLANT
GLOVE BIOGEL PI IND STRL 8.5 (GLOVE) ×1 IMPLANT
GLOVE BIOGEL PI INDICATOR 8.5 (GLOVE) ×1
GLOVE SURG ORTHO 8.0 STRL STRW (GLOVE) ×2 IMPLANT
GOWN BRE IMP PREV XXLGXLNG (GOWN DISPOSABLE) ×2 IMPLANT
GOWN PREVENTION PLUS XLARGE (GOWN DISPOSABLE) ×2 IMPLANT
KWIRE 4.0 X .035IN (WIRE) IMPLANT
LOOP VESSEL MAXI BLUE (MISCELLANEOUS) IMPLANT
NDL KEITH (NEEDLE) IMPLANT
NEEDLE 27GAX1X1/2 (NEEDLE) ×2 IMPLANT
NEEDLE HYPO 22GX1.5 SAFETY (NEEDLE) IMPLANT
NEEDLE KEITH (NEEDLE) IMPLANT
NS IRRIG 1000ML POUR BTL (IV SOLUTION) ×2 IMPLANT
PACK BASIN DAY SURGERY FS (CUSTOM PROCEDURE TRAY) ×2 IMPLANT
PAD CAST 3X4 CTTN HI CHSV (CAST SUPPLIES) ×1 IMPLANT
PADDING CAST ABS 3INX4YD NS (CAST SUPPLIES)
PADDING CAST ABS 4INX4YD NS (CAST SUPPLIES)
PADDING CAST ABS COTTON 3X4 (CAST SUPPLIES) IMPLANT
PADDING CAST ABS COTTON 4X4 ST (CAST SUPPLIES) ×1 IMPLANT
PADDING CAST COTTON 3X4 STRL (CAST SUPPLIES) ×2
SLEEVE SCD COMPRESS KNEE MED (MISCELLANEOUS) ×1 IMPLANT
SPLINT PLASTER CAST XFAST 3X15 (CAST SUPPLIES) IMPLANT
SPLINT PLASTER XTRA FASTSET 3X (CAST SUPPLIES) ×8
SPONGE GAUZE 4X4 12PLY (GAUZE/BANDAGES/DRESSINGS) ×2 IMPLANT
STOCKINETTE 4X48 STRL (DRAPES) ×2 IMPLANT
SUT CHROMIC 5 0 P 3 (SUTURE) IMPLANT
SUT ETHIBOND 3-0 V-5 (SUTURE) IMPLANT
SUT FIBERWIRE 2-0 18 17.9 3/8 (SUTURE)
SUT FIBERWIRE 4-0 18 TAPR NDL (SUTURE)
SUT MERSILENE 2.0 SH NDLE (SUTURE) IMPLANT
SUT MERSILENE 3 0 FS 1 (SUTURE) IMPLANT
SUT MERSILENE 4 0 P 3 (SUTURE) ×1 IMPLANT
SUT POLY BUTTON 15MM (SUTURE) IMPLANT
SUT PROLENE 2 0 SH DA (SUTURE) IMPLANT
SUT SILK 2 0 FS (SUTURE) IMPLANT
SUT SILK 4 0 PS 2 (SUTURE) IMPLANT
SUT STEEL 3 0 (SUTURE) IMPLANT
SUT STEEL 4 0 V 26 (SUTURE) IMPLANT
SUT VIC AB 3-0 PS1 18 (SUTURE)
SUT VIC AB 3-0 PS1 18XBRD (SUTURE) IMPLANT
SUT VIC AB 4-0 P-3 18XBRD (SUTURE) IMPLANT
SUT VIC AB 4-0 P3 18 (SUTURE)
SUT VICRYL 4-0 PS2 18IN ABS (SUTURE) IMPLANT
SUT VICRYL RAPID 5 0 P 3 (SUTURE) IMPLANT
SUT VICRYL RAPIDE 4/0 PS 2 (SUTURE) ×2 IMPLANT
SUTURE FIBERWR 2-0 18 17.9 3/8 (SUTURE) IMPLANT
SUTURE FIBERWR 4-0 18 TAPR NDL (SUTURE) IMPLANT
SYR BULB 3OZ (MISCELLANEOUS) ×2 IMPLANT
SYR CONTROL 10ML LL (SYRINGE) ×1 IMPLANT
TOWEL OR 17X24 6PK STRL BLUE (TOWEL DISPOSABLE) ×3 IMPLANT
TUBE FEEDING 5FR 15 INCH (TUBING) IMPLANT
UNDERPAD 30X30 INCONTINENT (UNDERPADS AND DIAPERS) ×2 IMPLANT
WATER STERILE IRR 1000ML POUR (IV SOLUTION) ×1 IMPLANT

## 2012-12-17 NOTE — Brief Op Note (Signed)
12/17/2012  1:41 PM  PATIENT:  Brenda Cook  79 y.o. female  PRE-OPERATIVE DIAGNOSIS:  SUBLUXATION EXTENSOR TENDON RIGHT RING FINGER/RIGHT SMALL FINGER  POST-OPERATIVE DIAGNOSIS:  SUBLUXATION EXTENSOR TENDON RIGHT RING FINGER/RIGHT SMALL FINGER  PROCEDURE:  Procedure(s) with comments: REPAIR EXTENSOR TENDON (Right) - CENTRALIZATION EXTENSOR TENDON RIGHT RING FINGER/RIGHT SMALL FINGER ANESTHESIA  SURGEON:  Surgeon(s) and Role:    * Nicki Reaper, MD - Primary  PHYSICIAN ASSISTANT:   ASSISTANTS: none   ANESTHESIA:   local and regional  EBL:  Total I/O In: 1000 [I.V.:1000] Out: -   BLOOD ADMINISTERED:none  DRAINS: none   LOCAL MEDICATIONS USED:  MARCAINE     SPECIMEN:  No Specimen  DISPOSITION OF SPECIMEN:  N/A  COUNTS:  YES  TOURNIQUET:   Total Tourniquet Time Documented: Forearm (Right) - 38 minutes Total: Forearm (Right) - 38 minutes   DICTATION: .Other Dictation: Dictation Number [id number]  PLAN OF CARE: Discharge to home after PACU  PATIENT DISPOSITION:  PACU - hemodynamically stable.

## 2012-12-17 NOTE — Op Note (Signed)
NAMERUSSIA, SCHEIDERER NO.:  [id number]  MEDICAL RECORD NO.:  [id number]  LOCATION:                                 FACILITY:  PHYSICIAN:  Cindee Salt, M.D.            DATE OF BIRTH:  DATE OF PROCEDURE:  12/17/2012 DATE OF DISCHARGE:                              OPERATIVE REPORT   PREOPERATIVE DIAGNOSIS:  Subluxation of extensor tendons, right ring, right small fingers.  POSTOPERATIVE DIAGNOSIS:  Subluxation of extensor tendons, right ring, right small fingers.  OPERATION:  Centralization of extensor tendons right ring, right small finger.  SURGEON:  Cindee Salt, M.D.  ANESTHESIA:  Forearm-based IV regional with local infiltration.  ANESTHESIOLOGIST:  Janetta Hora. Gelene Mink, M.D.  HISTORY:  The patient is an 79 year old female with a history of subluxation of the extensor tendons with triggering of the ring and little fingers of the right hand.  She is desirous of having this surgically reconstructed and that conservative treatment has not resolved symptoms for her.  Pre, peri, and postoperative course have been discussed along with risks and complications.  She is aware that there is no guarantee with the surgery; possibility of infection; recurrence of injury to arteries, nerves, tendons, incomplete relief of symptoms, dystrophy.  In the preoperative area, the patient was seen, the extremity marked by both patient and surgeon, antibiotic given.  PROCEDURE:  The patient was brought to the operating room where a forearm-based IV regional anesthetic was carried out without difficulty. She was prepped using ChloraPrep, supine position with the right arm free.  A 3-minute dry time was allowed.  Time-out taken, confirming the patient and procedure.  A longitudinal incision was then made over the metacarpophalangeal joint, metacarpal of her ring finger, carried down through subcutaneous tissue.  Bleeders were electrocauterized with bipolar.  Dorsal sensory  nerves were identified and protected.  The rupture of the extensor hood radial side was immediately apparent.  A large confluence of tendon from the common digital extensors and digiti quinti was noted on the ulnar aspect.  A separate incision was then made over the small finger.  This allowed the extensor tendon on the small finger to be centralized by relieving a portion of the hood ulnarly as well the hood to be centralized.  A ulnar aspect of the extensor digiti quinti was then harvested.  This was then passed through the central tendon through and under the intermetacarpal ligament back onto itself through the tendon and then back over the top.  This was sutured into position with figure-of-eight 4-0 Mersilene sutures.  This was centralized to maintain the central tendon directly over the metacarpal head with full flexion and full extension.  This allowed the ring finger extensor tendon to be then radially deviated.  A radial portion of 1 slip of the common digital extensor was then harvested with sharp dissection.  This was brought through the central tendon of the ring finger brought beneath the intermetacarpal ligament between the middle and ring and back onto itself through the central tendon and then back over the top of the central tendon sutured into position with multiple figure-of-eight 4-0 Mersilene sutures.  This allowed the tendon on the ring finger to be centralized directly over the metacarpal head with full flexion and extension.  There was no further subluxation radially or ulnarly to either tendon.  The wounds were copiously irrigated with saline.  The skin then closed with interrupted 4-0 Vicryl Rapide sutures.  A sterile compressive dressing, volar splint was applied.  On deflation of the tourniquet, all fingers immediately pinked.  She was taken to the recovery room for observation in satisfactory condition.  She will be discharged home to return to the [facility] [place] in 1 week on Vicodin.          ______________________________ Cindee Salt, M.D.     GK/MEDQ  D:  12/17/2012  T:  12/17/2012  Job:  [id number]

## 2012-12-17 NOTE — Transfer of Care (Signed)
Immediate Anesthesia Transfer of Care Note  Patient: Brenda Cook  Procedure(s) Performed: Procedure(s) with comments: REPAIR EXTENSOR TENDON (Right) - CENTRALIZATION EXTENSOR TENDON RIGHT RING FINGER/RIGHT SMALL FINGER ANESTHESIA  Patient Location: PACU  Anesthesia Type:Bier block  Level of Consciousness: awake and patient cooperative  Airway & Oxygen Therapy: Patient Spontanous Breathing and Patient connected to face mask oxygen  Post-op Assessment: Report given to PACU RN and Post -op Vital signs reviewed and stable  Post vital signs: Reviewed and stable  Complications: No apparent anesthesia complications

## 2012-12-17 NOTE — Anesthesia Postprocedure Evaluation (Signed)
Anesthesia Post Note  Patient: Brenda Cook  Procedure(s) Performed: Procedure(s) (LRB): REPAIR EXTENSOR TENDON (Right)  Anesthesia type: MAC  Patient location: PACU  Post pain: Pain level controlled  Post assessment: Patient's Cardiovascular Status Stable  Last Vitals:  Filed Vitals:   12/17/12 1431  BP:   Pulse: 75  Temp:   Resp: 18    Post vital signs: Reviewed and stable  Level of consciousness: alert  Complications: No apparent anesthesia complications

## 2012-12-17 NOTE — Progress Notes (Signed)
Marcina-Pacer rep for [employer] called from PACU to verify Check off on pacer.  States took strip to Pt. MD. Evaluated and saw pt in Pre-op.   No changes needed in post operative phase.  Maurine Minister CRNA also stated this in report from OR.  Will continue to monitor in Phase 1.

## 2012-12-17 NOTE — Op Note (Signed)
  Dictation Number [id number]

## 2012-12-17 NOTE — H&P (Signed)
Brenda Cook is 79 yo and for the past two to three weeks she has been having catching of her right ring and little fingers.  She has no history of injury. She complains of an occasional pain, catching when she uses it.  It does not awaken her at night.  She is not complaining of any numbness or tingling.   ALLERGIES:  None.   MEDICATIONS:  Nexium, Zoloft, HCTZ, amlodipine, metoprolol, aspirin; over-the-counter includes Citracal, Eye caps, B-12, multivitamins, Vitamin C, fish oil, Vitamin D3, Benadryl, Optiva and Systane .  SURGICAL HISTORY:  Hysterectomy, pacemaker, carpal tunnel releases, cataract surgery.  FAMILY MEDICAL HISTORY:  Positive for diabetes and arthritis.  SOCIAL HISTORY:  She does not smoke or drink.    REVIEW OF SYSTEMS:    Positive for glasses, hearing loss, high blood pressure, cataracts, stroke, easy bruising, otherwise negative 14 points.  Brenda Cook is an 79 y.o. female.   Chief Complaint: subluxating extensor tendons ring and little right HPI: see above  Past Medical History  Diagnosis Date  . Hypertension   . Hypertensive cardiovascular disease   . Peripheral edema   . Chronic cough   . GERD (gastroesophageal reflux disease)   . Arthritis   . Atrial fibrillation   . Complete heart block     Has old Thera pacemaker on right side that is turned off and new MDT Versa unit on left    Past Surgical History  Procedure Laterality Date  . Partial hysterectomy    . Appendectomy    . Tonsillectomy and adenoidectomy    . Insert / replace / remove pacemaker  7/10  . Insert / replace / remove pacemaker  5/79  . Insert / replace / remove pacemaker  7/95  . Shoulder surgery    . Tendon repair  07/20/2012    Procedure: TENDON REPAIR;  Surgeon: Nicki Reaper, MD;  Location: [facility];  Service: Orthopedics;  Laterality: Left;  Reconstruction Extensor hood Left middle finger and left ring finger    Family History  Problem Relation Age of Onset  .  Stroke Father   . Heart failure Mother   . Leukemia Sister    Social History:  reports that she has never smoked. She does not have any smokeless tobacco history on file. She reports that she does not drink alcohol or use illicit drugs.  Allergies:  Allergies  Allergen Reactions  . Benicar (Olmesartan Medoxomil)     Medications Prior to Admission  Medication Sig Dispense Refill  . aspirin 81 MG tablet Take 81 mg by mouth daily.        Marland Kitchen azelastine (OPTIVAR) 0.05 % ophthalmic solution Place 1 drop into both eyes 2 (two) times daily.      . calcium citrate-vitamin D (CITRACAL+D) 315-200 MG-UNIT per tablet Take 1 tablet by mouth 2 (two) times daily.      . cholecalciferol (VITAMIN D) 1000 UNITS tablet Take 1,000 Units by mouth daily.        . Cyanocobalamin (B-12 PO) Take 1 tablet by mouth daily.        . diphenhydrAMINE (BENADRYL) 25 MG tablet Take 25 mg by mouth as needed.        Marland Kitchen esomeprazole (NEXIUM) 40 MG capsule Take 40 mg by mouth daily before breakfast.      . fish oil-omega-3 fatty acids 1000 MG capsule Take 1 g by mouth daily.        . hydrochlorothiazide (HYDRODIURIL) 25 MG  tablet TAKE 1 TABLET DAILY  90 tablet  3  . metoprolol succinate (TOPROL-XL) 25 MG 24 hr tablet TAKE ONE-HALF (1/2) TABLET DAILY  45 tablet  3  . Multiple Vitamins-Minerals (EYE VITAMINS) CAPS Take 1 capsule by mouth daily.        . multivitamin (THERAGRAN) per tablet Take 1 tablet by mouth daily.        Bertram Gala Glycol-Propyl Glycol (SYSTANE OP) Apply to eye. As directed       . potassium chloride SA (K-DUR,KLOR-CON) 20 MEQ tablet Take 1 tablet (20 mEq total) by mouth daily.  90 tablet  4  . sertraline (ZOLOFT) 50 MG tablet Take 75 mg by mouth daily.        . vitamin C (ASCORBIC ACID) 500 MG tablet Take 500 mg by mouth daily.        No results found for this or any previous visit (from the past 48 hour(s)).  No results found.   Pertinent items are noted in HPI.  Blood pressure 157/88, pulse 90,  temperature 97.7 F (36.5 C), temperature source Oral, resp. rate 16, height 5\' 1"  (1.549 m), weight 62.596 kg (138 lb), SpO2 96.00%.  General appearance: alert, cooperative and appears stated age Head: Normocephalic, without obvious abnormality Neck: no JVD Resp: clear to auscultation bilaterally Cardio: regular rate and rhythm, S1, S2 normal, no murmur, click, rub or gallop GI: soft, non-tender; bowel sounds normal; no masses,  no organomegaly Extremities: extremities normal, atraumatic, no cyanosis or edema Pulses: 2+ and symmetric Skin: Skin color, texture, turgor normal. No rashes or lesions Neurologic: Grossly normal Incision/Wound: na  Assessment/Plan  DX: subluxation right ring and little finger extensor tendons.   This has not shown any resolution with splinting.  She is desirous of proceeding to have this repaired with stabilization centrally, the remaining fingers do not subluxate.  She is scheduled for centralization extensor tendon right ring finger as an outpatient.  Brenda Cook 12/17/2012, 10:53 AM

## 2012-12-17 NOTE — Anesthesia Preprocedure Evaluation (Signed)
Anesthesia Evaluation  Patient identified by MRN, date of birth, ID band Patient awake    Reviewed: Allergy & Precautions, H&P , NPO status , Patient's Chart, lab work & pertinent test results, reviewed documented beta blocker date and time   Airway Mallampati: II TM Distance: >3 FB Neck ROM: full    Dental   Pulmonary neg pulmonary ROS,  breath sounds clear to auscultation        Cardiovascular hypertension, On Medications and On Home Beta Blockers + dysrhythmias Atrial Fibrillation + pacemaker Rhythm:regular     Neuro/Psych negative neurological ROS  negative psych ROS   GI/Hepatic Neg liver ROS, GERD-  Medicated and Controlled,  Endo/Other  negative endocrine ROS  Renal/GU negative Renal ROS  negative genitourinary   Musculoskeletal   Abdominal   Peds  Hematology negative hematology ROS (+)   Anesthesia Other Findings See surgeon's H&P   Reproductive/Obstetrics negative OB ROS                           Anesthesia Physical Anesthesia Plan  ASA: III  Anesthesia Plan: MAC and Bier Block   Post-op Pain Management:    Induction:   Airway Management Planned: Simple Face Mask  Additional Equipment:   Intra-op Plan:   Post-operative Plan:   Informed Consent: I have reviewed the patients History and Physical, chart, labs and discussed the procedure including the risks, benefits and alternatives for the proposed anesthesia with the patient or authorized representative who has indicated his/her understanding and acceptance.   Dental Advisory Given  Plan Discussed with: CRNA and Surgeon  Anesthesia Plan Comments:         Anesthesia Quick Evaluation

## 2012-12-20 ENCOUNTER — Encounter (HOSPITAL_BASED_OUTPATIENT_CLINIC_OR_DEPARTMENT_OTHER): Payer: Self-pay | Admitting: Orthopedic Surgery

## 2012-12-23 ENCOUNTER — Encounter: Payer: Self-pay | Admitting: Internal Medicine

## 2012-12-23 ENCOUNTER — Ambulatory Visit (INDEPENDENT_AMBULATORY_CARE_PROVIDER_SITE_OTHER): Payer: Medicare Other | Admitting: Internal Medicine

## 2012-12-23 VITALS — BP 158/72 | HR 74 | Wt 142.4 lb

## 2012-12-23 DIAGNOSIS — I10 Essential (primary) hypertension: Secondary | ICD-10-CM

## 2012-12-23 DIAGNOSIS — I442 Atrioventricular block, complete: Secondary | ICD-10-CM

## 2012-12-23 DIAGNOSIS — I495 Sick sinus syndrome: Secondary | ICD-10-CM

## 2012-12-23 LAB — PACEMAKER DEVICE OBSERVATION
AL AMPLITUDE: 4 mv
BAMS-0001: 175 {beats}/min
RV LEAD AMPLITUDE: 15.68 mv
RV LEAD THRESHOLD: 0.5 V

## 2012-12-23 NOTE — Patient Instructions (Addendum)
Your physician wants you to follow-up in: 6 months in the device clinic and 12 months with Dr Allred You will receive a reminder letter in the mail two months in advance. If you don't receive a letter, please call our office to schedule the follow-up appointment.  

## 2012-12-26 NOTE — Progress Notes (Signed)
PCP:  [name], MD Primary Cardiologist:  Dr Patty Sermons  The patient presents today for routine electrophysiology followup.  Since last being seen in our clinic, the patient reports doing very well.  She remains active despite her age.  Today, she denies symptoms of palpitations, chest pain, shortness of breath, orthopnea, PND, lower extremity edema, dizziness, presyncope, syncope, or neurologic sequela.  The patient feels that she is tolerating medications without difficulties and is otherwise without complaint today.   Past Medical History  Diagnosis Date  . Hypertension   . Hypertensive cardiovascular disease   . Peripheral edema   . Chronic cough   . GERD (gastroesophageal reflux disease)   . Arthritis   . Atrial fibrillation   . Complete heart block     Has old Thera pacemaker on right side that is turned off and new MDT Versa unit on left   Past Surgical History  Procedure Laterality Date  . Partial hysterectomy    . Appendectomy    . Tonsillectomy and adenoidectomy    . Insert / replace / remove pacemaker  7/10  . Insert / replace / remove pacemaker  5/79  . Insert / replace / remove pacemaker  7/95  . Shoulder surgery    . Tendon repair  07/20/2012    Procedure: TENDON REPAIR;  Surgeon: Nicki Reaper, MD;  Location: [facility];  Service: Orthopedics;  Laterality: Left;  Reconstruction Extensor hood Left middle finger and left ring finger  . Repair extensor tendon Right 12/17/2012    Procedure: REPAIR EXTENSOR TENDON;  Surgeon: Nicki Reaper, MD;  Location: [facility];  Service: Orthopedics;  Laterality: Right;  CENTRALIZATION EXTENSOR TENDON RIGHT RING FINGER/RIGHT SMALL FINGER ANESTHESIA    Current Outpatient Prescriptions  Medication Sig Dispense Refill  . aspirin 81 MG tablet Take 81 mg by mouth daily.        Marland Kitchen azelastine (OPTIVAR) 0.05 % ophthalmic solution Place 1 drop into both eyes 2 (two) times daily.      . calcium citrate-vitamin  D (CITRACAL+D) 315-200 MG-UNIT per tablet Take 1 tablet by mouth 2 (two) times daily.      . cholecalciferol (VITAMIN D) 1000 UNITS tablet Take 1,000 Units by mouth daily.        . Cyanocobalamin (B-12 PO) Take 1 tablet by mouth daily.        . diphenhydrAMINE (BENADRYL) 25 MG tablet Take 25 mg by mouth as needed.        Marland Kitchen esomeprazole (NEXIUM) 40 MG capsule Take 40 mg by mouth daily before breakfast.      . fish oil-omega-3 fatty acids 1000 MG capsule Take 1 g by mouth daily.        . hydrochlorothiazide (HYDRODIURIL) 25 MG tablet TAKE 1 TABLET DAILY  90 tablet  3  . HYDROcodone-acetaminophen (NORCO) 5-325 MG per tablet Take 1 tablet by mouth every 6 (six) hours as needed for pain.  30 tablet  0  . metoprolol succinate (TOPROL-XL) 25 MG 24 hr tablet TAKE ONE-HALF (1/2) TABLET DAILY  45 tablet  3  . Multiple Vitamins-Minerals (EYE VITAMINS) CAPS Take 1 capsule by mouth daily.        . multivitamin (THERAGRAN) per tablet Take 1 tablet by mouth daily.        Bertram Gala Glycol-Propyl Glycol (SYSTANE OP) Apply to eye. As directed       . potassium chloride SA (K-DUR,KLOR-CON) 20 MEQ tablet Take 1 tablet (20 mEq total)  by mouth daily.  90 tablet  4  . sertraline (ZOLOFT) 50 MG tablet Take 75 mg by mouth daily.        . vitamin C (ASCORBIC ACID) 500 MG tablet Take 500 mg by mouth daily.       No current facility-administered medications for this visit.    Allergies  Allergen Reactions  . Benicar (Olmesartan Medoxomil)     History   Social History  . Marital Status: Widowed    Spouse Name: N/A    Number of Children: N/A  . Years of Education: N/A   Occupational History  . Not on file.   Social History Main Topics  . Smoking status: Never Smoker   . Smokeless tobacco: Not on file  . Alcohol Use: No  . Drug Use: No  . Sexually Active: No   Other Topics Concern  . Not on file   Social History Narrative  . No narrative on file    Family History  Problem Relation Age of Onset   . Stroke Father   . Heart failure Mother   . Leukemia Sister     Physical Exam: Filed Vitals:   12/23/12 1142  BP: 158/72  Pulse: 74  Weight: 142 lb 6.4 oz (64.592 kg)    GEN- The patient is elderly appearing, alert and oriented x 3 today.   Head- normocephalic, atraumatic Eyes-  Sclera clear, conjunctiva pink Ears- hearing intact Oropharynx- clear Neck- supple, no JVP Lymph- no cervical lymphadenopathy Lungs- Clear to ausculation bilaterally, normal work of breathing Chest- pacemaker pocket is well healed Heart- Regular rate and rhythm, no murmurs, rubs or gallops, PMI not laterally displaced GI- soft, NT, ND, + BS Extremities- no clubbing, cyanosis, or edema  ekg today reveals AV sequential pacing at 84 bpm  Pacemaker interrogation- reviewed in detail today,  See PACEART report  Assessment and Plan:  1. Complete heart block Normal pacemaker function See Pace Art report No changes today  2. Afib No afib since last interrogation Not felt to be a candidate by Dr Patty Sermons for anticoagulation Could consider eliquis which has been shown to have the same safety profile as ASA in AVEROES with significant stroke reduction  3. HTN Slightly elevated Salt restriction Advised  Return to the device clinic in 6 months

## 2013-01-07 ENCOUNTER — Encounter (HOSPITAL_COMMUNITY): Payer: Self-pay | Admitting: *Deleted

## 2013-01-07 ENCOUNTER — Emergency Department (HOSPITAL_COMMUNITY): Payer: Medicare Other

## 2013-01-07 ENCOUNTER — Emergency Department (HOSPITAL_COMMUNITY)
Admission: EM | Admit: 2013-01-07 | Discharge: 2013-01-08 | Disposition: A | Discharge status: Home / Self Care | Payer: Medicare Other | Attending: Emergency Medicine | Admitting: Emergency Medicine

## 2013-01-07 ENCOUNTER — Telehealth: Payer: Self-pay | Admitting: Internal Medicine

## 2013-01-07 DIAGNOSIS — Z950 Presence of cardiac pacemaker: Secondary | ICD-10-CM | POA: Insufficient documentation

## 2013-01-07 DIAGNOSIS — Z79899 Other long term (current) drug therapy: Secondary | ICD-10-CM | POA: Insufficient documentation

## 2013-01-07 DIAGNOSIS — I442 Atrioventricular block, complete: Secondary | ICD-10-CM | POA: Insufficient documentation

## 2013-01-07 DIAGNOSIS — Z7982 Long term (current) use of aspirin: Secondary | ICD-10-CM | POA: Insufficient documentation

## 2013-01-07 DIAGNOSIS — M25562 Pain in left knee: Secondary | ICD-10-CM

## 2013-01-07 DIAGNOSIS — I10 Essential (primary) hypertension: Secondary | ICD-10-CM | POA: Insufficient documentation

## 2013-01-07 DIAGNOSIS — R059 Cough, unspecified: Secondary | ICD-10-CM | POA: Insufficient documentation

## 2013-01-07 DIAGNOSIS — Z9181 History of falling: Secondary | ICD-10-CM | POA: Insufficient documentation

## 2013-01-07 DIAGNOSIS — R609 Edema, unspecified: Secondary | ICD-10-CM | POA: Insufficient documentation

## 2013-01-07 DIAGNOSIS — Z8679 Personal history of other diseases of the circulatory system: Secondary | ICD-10-CM | POA: Insufficient documentation

## 2013-01-07 DIAGNOSIS — I119 Hypertensive heart disease without heart failure: Secondary | ICD-10-CM | POA: Insufficient documentation

## 2013-01-07 DIAGNOSIS — M129 Arthropathy, unspecified: Secondary | ICD-10-CM | POA: Insufficient documentation

## 2013-01-07 DIAGNOSIS — Z8673 Personal history of transient ischemic attack (TIA), and cerebral infarction without residual deficits: Secondary | ICD-10-CM | POA: Insufficient documentation

## 2013-01-07 DIAGNOSIS — R05 Cough: Secondary | ICD-10-CM | POA: Insufficient documentation

## 2013-01-07 DIAGNOSIS — K219 Gastro-esophageal reflux disease without esophagitis: Secondary | ICD-10-CM | POA: Insufficient documentation

## 2013-01-07 DIAGNOSIS — M25569 Pain in unspecified knee: Secondary | ICD-10-CM | POA: Insufficient documentation

## 2013-01-07 DIAGNOSIS — M25469 Effusion, unspecified knee: Secondary | ICD-10-CM | POA: Insufficient documentation

## 2013-01-07 HISTORY — DX: Cerebral infarction, unspecified: I63.9

## 2013-01-07 LAB — URINE MICROSCOPIC-ADD ON

## 2013-01-07 LAB — CBC WITH DIFFERENTIAL/PLATELET
Basophils Relative: 0 % (ref 0–1)
Eosinophils Absolute: 0 10*3/uL (ref 0.0–0.7)
Eosinophils Relative: 1 % (ref 0–5)
Hemoglobin: 11.8 g/dL — ABNORMAL LOW (ref 12.0–15.0)
MCH: 29.2 pg (ref 26.0–34.0)
MCHC: 34.5 g/dL (ref 30.0–36.0)
MCV: 84.7 fL (ref 78.0–100.0)
Monocytes Relative: 9 % (ref 3–12)
Neutrophils Relative %: 78 % — ABNORMAL HIGH (ref 43–77)
Platelets: 219 10*3/uL (ref 150–400)

## 2013-01-07 LAB — URINALYSIS, ROUTINE W REFLEX MICROSCOPIC
Glucose, UA: NEGATIVE mg/dL
Nitrite: NEGATIVE
Specific Gravity, Urine: 1.021 (ref 1.005–1.030)
pH: 6 (ref 5.0–8.0)

## 2013-01-07 LAB — COMPREHENSIVE METABOLIC PANEL
Albumin: 3.4 g/dL — ABNORMAL LOW (ref 3.5–5.2)
Alkaline Phosphatase: 106 U/L (ref 39–117)
BUN: 20 mg/dL (ref 6–23)
Calcium: 10.4 mg/dL (ref 8.4–10.5)
GFR calc Af Amer: 85 mL/min — ABNORMAL LOW (ref >90)
Potassium: 3.4 mEq/L — ABNORMAL LOW (ref 3.5–5.1)
Total Protein: 7.4 g/dL (ref 6.0–8.3)

## 2013-01-07 MED ORDER — ACETAMINOPHEN 325 MG PO TABS
650.0000 mg | ORAL_TABLET | Freq: Once | ORAL | Status: AC
  Administered 2013-01-07: 650 mg via ORAL
  Filled 2013-01-07: qty 2

## 2013-01-07 NOTE — Telephone Encounter (Signed)
Pt was in 2-27 problem with BP but dr allred didn't change meds at that time she thought, but dtr looked on her paper for that visit. And dosage was different for hctz so she just gave her mom the new dosage, now having problems again with BP,  109/74 this am and at  12p 124/66

## 2013-01-07 NOTE — ED Notes (Signed)
Pt and family member reports pt having difficulty standing and walking x 2 weeks. Getting progressively weaker and fatigued. Sent by pcp for blood work and eval. No neuro deficits noted at triage, denies any pain or urinary symptoms. No acute distress noted.

## 2013-01-07 NOTE — Telephone Encounter (Signed)
Left message to call back  

## 2013-01-07 NOTE — Telephone Encounter (Signed)
Per daughter patient prior to ov with Dr Johney Frame only taking HCTZ 25 mg 1/2 tablet daily.  Her daughter read the AVS that was given at ov with Dr Johney Frame HCTZ 25 mg said full tablet.  Daughter then started to give her a full tablet daily.  Since the weekend the patient has had a lot of difficulty with walking, EMS did come out and said she might be having mini strokes. Daughter did call Dr Manus Gunning and he recommended her stop pain medication (vicodin). Patient was only taking the Vicodin at bedtime and she has not had any since then. Per daughter patient can not really walk without assistance. Discussed with  Dr. Patty Sermons and will have patient go to ED secondary to extreme change with weakness in legs, may need labs to evaluate electrolytes and/or scan. Advised patients daughter, verbalized understanding

## 2013-01-07 NOTE — ED Provider Notes (Signed)
History     CSN: [id number]  Arrival date & time 01/07/13  1746   First MD Initiated Contact with Patient 01/07/13 2223      Chief Complaint  Patient presents with  . Fatigue    (Consider location/radiation/quality/duration/timing/severity/associated sxs/prior treatment) HPI  Brenda Cook is a 79 y.o. female who lives with her daughter sent by her primary care doctor to evaluate her generalized fatigue over the last 2 weeks. Patient lives with her daughter and normally ambulates independently, sometimes with the help of a walker. Recently she has been falling. Patient and daughter deny any head trauma. As per the patient she has pain to the left knee and states it is "difficult to control." She denies chest pain, shortnesshortness of breath, abdominal pain, decreased PO intake. Patient was taking Vicodin for recent right hand surgery but has been DC'd it because it was advised that this may be making her ambulation more difficult. She has not been taking any pain control medication.  Past Medical History  Diagnosis Date  . Hypertension   . Hypertensive cardiovascular disease   . Peripheral edema   . Chronic cough   . GERD (gastroesophageal reflux disease)   . Arthritis   . Atrial fibrillation   . Complete heart block     Has old Thera pacemaker on right side that is turned off and new MDT Versa unit on left  . Stroke     Past Surgical History  Procedure Laterality Date  . Partial hysterectomy    . Appendectomy    . Tonsillectomy and adenoidectomy    . Insert / replace / remove pacemaker  7/10  . Insert / replace / remove pacemaker  5/79  . Insert / replace / remove pacemaker  7/95  . Shoulder surgery    . Tendon repair  07/20/2012    Procedure: TENDON REPAIR;  Surgeon: Nicki Reaper, MD;  Location: [facility];  Service: Orthopedics;  Laterality: Left;  Reconstruction Extensor hood Left middle finger and left ring finger  . Repair extensor tendon Right  12/17/2012    Procedure: REPAIR EXTENSOR TENDON;  Surgeon: Nicki Reaper, MD;  Location: [facility];  Service: Orthopedics;  Laterality: Right;  CENTRALIZATION EXTENSOR TENDON RIGHT RING FINGER/RIGHT SMALL FINGER ANESTHESIA    Family History  Problem Relation Age of Onset  . Stroke Father   . Heart failure Mother   . Leukemia Sister     History  Substance Use Topics  . Smoking status: Never Smoker   . Smokeless tobacco: Not on file  . Alcohol Use: No    OB History   Grav Para Term Preterm Abortions TAB SAB Ect Mult Living                  Review of Systems  Constitutional: Negative for fever.  Respiratory: Negative for shortness of breath.   Cardiovascular: Negative for chest pain.  Gastrointestinal: Negative for nausea, vomiting, abdominal pain and diarrhea.  Musculoskeletal: Positive for joint swelling.  All other systems reviewed and are negative.    Allergies  Benicar  Home Medications   Current Outpatient Rx  Name  Route  Sig  Dispense  Refill  . aspirin EC 81 MG tablet   Oral   Take 81 mg by mouth daily.         Marland Kitchen b complex vitamins tablet   Oral   Take 1 tablet by mouth every evening.         Marland Kitchen  Bioflavonoid Products (ESTER-C) 500-200-60 MG TABS   Oral   Take 1 tablet by mouth every evening.         . calcium carbonate (OS-CAL) 600 MG TABS   Oral   Take 600 mg by mouth 2 (two) times daily with a meal.         . Cholecalciferol (VITAMIN D-3) 5000 UNITS TABS   Oral   Take 1 tablet by mouth every evening.         . diphenhydrAMINE (BENADRYL) 25 MG tablet   Oral   Take 12.5 mg by mouth at bedtime.          . Flaxseed, Linseed, (FLAXSEED OIL PO)   Oral   Take 2,400 mg by mouth 2 (two) times daily.         . hydrochlorothiazide (HYDRODIURIL) 25 MG tablet   Oral   Take 25 mg by mouth daily.         Marland Kitchen HYDROcodone-acetaminophen (NORCO/VICODIN) 5-325 MG per tablet   Oral   Take 1 tablet by mouth at bedtime as needed  for pain.         . metoprolol succinate (TOPROL-XL) 25 MG 24 hr tablet   Oral   Take 12.5 mg by mouth daily.         . Multiple Vitamin (MULTIVITAMIN WITH MINERALS) TABS   Oral   Take 1 tablet by mouth every evening.         . Multiple Vitamins-Minerals (ICAPS) CAPS   Oral   Take 1 capsule by mouth every evening.         . Omega-3 Fatty Acids (FISH OIL) 1200 MG CAPS   Oral   Take 1,200 mg by mouth 2 (two) times daily.         . pantoprazole (PROTONIX) 40 MG tablet   Oral   Take 40 mg by mouth daily.         Bertram Gala Glycol-Propyl Glycol (SYSTANE OP)   Both Eyes   Place 1 drop into both eyes 3 (three) times daily as needed (for dry eyes).          . potassium chloride SA (K-DUR,KLOR-CON) 20 MEQ tablet   Oral   Take 1 tablet (20 mEq total) by mouth daily.   90 tablet   4   . sertraline (ZOLOFT) 100 MG tablet   Oral   Take 150 mg by mouth daily.           BP 133/69  Pulse 97  Temp(Src) 98.4 F (36.9 C) (Oral)  Resp 18  SpO2 95%  Physical Exam  Nursing note and vitals reviewed. Constitutional: She is oriented to person, place, and time. She appears well-developed and well-nourished. No distress.  HENT:  Head: Normocephalic and atraumatic.  Mouth/Throat: Oropharynx is clear and moist.  Eyes: Conjunctivae and EOM are normal. Pupils are equal, round, and reactive to light.  Neck: Normal range of motion. Neck supple. No JVD present.  Cardiovascular: Normal rate, regular rhythm and intact distal pulses.   Pulmonary/Chest: Effort normal and breath sounds normal. No stridor. No respiratory distress. She has no wheezes. She has no rales. She exhibits no tenderness.  Abdominal: Soft. Bowel sounds are normal. She exhibits no distension and no mass. There is no tenderness. There is no rebound and no guarding.  Musculoskeletal: Normal range of motion.  Left knee shows moderate effusion, trace warmth, +crepitus; no erythema, induration, pain with motion.   Lymphadenopathy:    She has  no cervical adenopathy.  Neurological: She is alert and oriented to person, place, and time.  Psychiatric: She has a normal mood and affect.    ED Course  Procedures (including critical care time)  Labs Reviewed  CBC WITH DIFFERENTIAL - Abnormal; Notable for the following:    Hemoglobin 11.8 (*)    HCT 34.2 (*)    Neutrophils Relative 78 (*)    All other components within normal limits  COMPREHENSIVE METABOLIC PANEL - Abnormal; Notable for the following:    Sodium 134 (*)    Potassium 3.4 (*)    Glucose, Bld 170 (*)    Albumin 3.4 (*)    GFR calc non Af Amer 73 (*)    GFR calc Af Amer 85 (*)    All other components within normal limits  URINALYSIS, ROUTINE W REFLEX MICROSCOPIC - Abnormal; Notable for the following:    Color, Urine AMBER (*)    Leukocytes, UA TRACE (*)    All other components within normal limits  URINE MICROSCOPIC-ADD ON - Abnormal; Notable for the following:    Casts HYALINE CASTS (*)    All other components within normal limits   Dg Chest 2 View  01/07/2013  *RADIOLOGY REPORT*  Clinical Data: Left knee pain after fall.  Confusion and fatigue.  CHEST - 2 VIEW  Comparison: 10/14/2011  Findings: Cardiac pacemakers with bilateral generator pack.  Lead tips on the right appear to be discontinuous but this is stable since previous study.  The shallow inspiration.  Mild cardiac enlargement with normal pulmonary vascularity.  Peribronchial thickening and interstitial fibrosis consistent with chronic bronchitis.  No focal consolidation or airspace disease in the lungs.  No blunting of costophrenic angles.  No pneumothorax. Mediastinal contours appear intact.  Calcified and tortuous aorta. Degenerative changes in the thoracic spine and shoulders. Esophageal hiatal hernia behind the heart. Stable compression fracture in the lower thoracic spine.  No significant change since previous study.  IMPRESSION: Mild cardiac enlargement.  Scattered fibrosis  and chronic bronchitic changes.  No evidence of active consolidation.   Original Report Authenticated By: Burman Nieves, M.D.    Dg Knee Complete 4 Views Left  01/07/2013  *RADIOLOGY REPORT*  Clinical Data: Pain after fall.  LEFT KNEE - COMPLETE 4+ VIEW  Comparison: None.  Findings: Diffuse bone demineralization.  Degenerative changes in the left knee involving medial, lateral, and patellofemoral compartments.  All compartments are narrowed and there are diffuse osteophytic changes.  Chondrocalcinosis.  Old fracture fragment or old ununited ossicle over the medial tibial plateau.  No evidence of acute fracture or subluxation.  No focal bone destruction.  No significant effusion.  Vascular calcifications.  IMPRESSION: Prominent degenerative changes in the left knee.  No acute fractures identified.   Original Report Authenticated By: Burman Nieves, M.D.     Date: 01/07/2013  Rate: 92  Rhythm: Paced rhythm  QRS Axis: indeterminate  Intervals: paced  ST/T Wave abnormalities: nonspecific ST/T changes  Conduction Disutrbances:none  Narrative Interpretation:   Old EKG Reviewed: unchanged     1. Knee pain, left       MDM   Brenda Cook is a 79 y.o. female complaning of difficulty ambulating secondary to left knee pain.   X-ray shows severe degenerative disc disease. Patient has not been taking anything for pain control I have given her 650 mg of acetaminophen and she ambulates well. I advised pain control and followup with orthopedist for further management  This is a shared visit with  the attending physician who personally evaluated the patient and agrees with the care plan.     Filed Vitals:   01/07/13 1815  BP: 133/69  Pulse: 97  Temp: 98.4 F (36.9 C)  TempSrc: Oral  Resp: 18  SpO2: 95%     Pt verbalized understanding and agrees with care plan. Outpatient follow-up and return precautions given.          Wynetta Emery, PA-C 01/08/13 [phone]

## 2013-01-08 NOTE — ED Provider Notes (Signed)
12:36 AM Mild tenderness along inferior aspect of patella. X-ray shows evidence of degenerative changes. We will refer to Dr. Lajoyce Corners of orthopedics for further evaluation as she may benefit from joint fluid injections.  Medical screening examination/treatment/procedure(s) were conducted as a shared visit with non-physician practitioner(s) and myself.  I personally evaluated the patient during the encounter   Hanley Seamen, MD 01/08/13 [phone]

## 2013-01-08 NOTE — ED Notes (Signed)
Pt ambulated from the room to the desk (approximately 15 feet) and ambulated back to room with one person assist.  Pt stated that her knee felt better and she could at least put more weight on her left knee.  Pt. Gate still unsteady/will need assistance when ambulating at home.

## 2013-05-04 ENCOUNTER — Other Ambulatory Visit: Payer: Self-pay | Admitting: Cardiology

## 2013-05-04 NOTE — Telephone Encounter (Signed)
Pt daughter stated that her mom does not need a refill on HCTZ at this time-She will make [place] aware when her mom does

## 2013-06-01 ENCOUNTER — Telehealth: Payer: Self-pay | Admitting: Internal Medicine

## 2013-06-01 NOTE — Telephone Encounter (Signed)
New Prob      Requesting a new prescription for [facility]

## 2013-06-09 ENCOUNTER — Other Ambulatory Visit: Payer: Self-pay | Admitting: Cardiology

## 2013-06-09 MED ORDER — METOPROLOL SUCCINATE ER 25 MG PO TB24: 12.5000 mg | ORAL_TABLET | Freq: Every day | ORAL | Status: DC

## 2013-06-23 ENCOUNTER — Ambulatory Visit (INDEPENDENT_AMBULATORY_CARE_PROVIDER_SITE_OTHER): Payer: Medicare Other | Admitting: *Deleted

## 2013-06-23 ENCOUNTER — Encounter: Payer: Self-pay | Admitting: Internal Medicine

## 2013-06-23 DIAGNOSIS — I442 Atrioventricular block, complete: Secondary | ICD-10-CM

## 2013-06-23 LAB — PACEMAKER DEVICE OBSERVATION
AL AMPLITUDE: 2 mv
ATRIAL PACING PM: 76
BATTERY VOLTAGE: 2.77 V
RV LEAD IMPEDENCE PM: 488 Ohm
VENTRICULAR PACING PM: 100

## 2013-06-23 NOTE — Progress Notes (Signed)
PPM check in office. 

## 2013-08-26 ENCOUNTER — Encounter: Payer: Self-pay | Admitting: Cardiology

## 2013-09-19 ENCOUNTER — Other Ambulatory Visit: Payer: Self-pay | Admitting: Cardiology

## 2013-12-20 ENCOUNTER — Other Ambulatory Visit: Payer: Self-pay | Admitting: Cardiology

## 2013-12-21 ENCOUNTER — Other Ambulatory Visit: Payer: Self-pay | Admitting: *Deleted

## 2013-12-21 MED ORDER — POTASSIUM CHLORIDE CRYS ER 20 MEQ PO TBCR: 20.0000 meq | EXTENDED_RELEASE_TABLET | Freq: Every day | ORAL | Status: DC

## 2013-12-21 NOTE — Telephone Encounter (Signed)
Fax Received. Refill Completed. Arbor Cohen Chowoe (M.A)  

## 2013-12-21 NOTE — Telephone Encounter (Signed)
Pt daughter is aware of appointment. Fax Received. Refill Completed. Dorethy Tomey Chowoe (R.M.A)

## 2013-12-26 ENCOUNTER — Encounter: Payer: Self-pay | Admitting: Cardiology

## 2014-01-05 ENCOUNTER — Encounter: Payer: Medicare Other | Admitting: Internal Medicine

## 2014-01-10 ENCOUNTER — Encounter: Payer: Medicare Other | Admitting: Cardiology

## 2014-02-14 ENCOUNTER — Encounter: Payer: Medicare Other | Admitting: Cardiology

## 2014-02-20 ENCOUNTER — Encounter: Payer: Self-pay | Admitting: Cardiology

## 2014-02-20 ENCOUNTER — Ambulatory Visit (INDEPENDENT_AMBULATORY_CARE_PROVIDER_SITE_OTHER): Payer: Medicare Other | Admitting: Cardiology

## 2014-02-20 VITALS — BP 126/70 | HR 60 | Ht 61.0 in | Wt 146.0 lb

## 2014-02-20 DIAGNOSIS — I495 Sick sinus syndrome: Secondary | ICD-10-CM

## 2014-02-20 DIAGNOSIS — I442 Atrioventricular block, complete: Secondary | ICD-10-CM

## 2014-02-20 DIAGNOSIS — Z950 Presence of cardiac pacemaker: Secondary | ICD-10-CM

## 2014-02-20 DIAGNOSIS — R609 Edema, unspecified: Secondary | ICD-10-CM

## 2014-02-20 DIAGNOSIS — I10 Essential (primary) hypertension: Secondary | ICD-10-CM

## 2014-02-20 LAB — BASIC METABOLIC PANEL
BUN: 18 mg/dL (ref 6–23)
CALCIUM: 10.2 mg/dL (ref 8.4–10.5)
CO2: 27 mEq/L (ref 19–32)
Chloride: 102 mEq/L (ref 96–112)
Creatinine, Ser: 0.7 mg/dL (ref 0.4–1.2)
GFR: 86.3 mL/min (ref >60.00)
Glucose, Bld: 155 mg/dL — ABNORMAL HIGH (ref 70–99)
POTASSIUM: 3.2 meq/L — AB (ref 3.5–5.1)
SODIUM: 138 meq/L (ref 135–145)

## 2014-02-20 NOTE — Assessment & Plan Note (Signed)
Patient has not had any severe dizziness or syncope.  Her pulse is regular on bedside exam

## 2014-02-20 NOTE — Assessment & Plan Note (Signed)
The patient has not been having any recent peripheral edema.  She has a past history of hypokalemia and we are checking a basal metabolic panel today.

## 2014-02-20 NOTE — Patient Instructions (Signed)
Will obtain labs today and call you with the results (BMET)  Your physician recommends that you continue on your current medications as directed. Please refer to the Current Medication list given to you today.  Your physician wants you to follow-up in: 1 YEAR You will receive a reminder letter in the mail two months in advance. If you don't receive a letter, please call our office to schedule the follow-up appointment.

## 2014-02-20 NOTE — Assessment & Plan Note (Signed)
Blood pressure was remaining stable on current therapy.  No chest pain.  No syncope.  She gets dizzy if she turns too quickly but not otherwise.

## 2014-02-20 NOTE — Progress Notes (Signed)
Brenda Cook Date of Birth:  1920/11/23 [address] [place], [place]  [id number] [phone]         Fax   [phone]  History of Present Illness: This pleasant 80 year old woman is seen for a scheduled one year followup office visit.  She has a past history of symptomatic bradycardia and has a functioning pacemaker in place.  She has had a pacemaker since 1995.  He has had a problem with essential hypertension and hypokalemia.  Since last visit she has been feeling better.  We reduced her diuretics at her last visit and she has been able to gain for more pounds and feels better and has not been experiencing any peripheral edema or signs of fluid overload.  Her daughter states that she does have some problems with shortness of breath after talking.  Current Outpatient Prescriptions  Medication Sig Dispense Refill  . aspirin EC 81 MG tablet Take 81 mg by mouth daily.      Marland Kitchen. b complex vitamins tablet Take 1 tablet by mouth every evening.      Marland Kitchen. Bioflavonoid Products (ESTER-C) 500-200-60 MG TABS Take 1 tablet by mouth every evening.      . calcium carbonate (OS-CAL) 600 MG TABS Take 600 mg by mouth 2 (two) times daily with a meal.      . Cholecalciferol (VITAMIN D-3) 5000 UNITS TABS Take 1 tablet by mouth every evening.      . diphenhydrAMINE (BENADRYL) 25 MG tablet Take 12.5 mg by mouth at bedtime.       . Flaxseed, Linseed, (FLAXSEED OIL PO) Take 2,400 mg by mouth 2 (two) times daily.      . hydrochlorothiazide (HYDRODIURIL) 25 MG tablet Take 25 mg by mouth daily.      Marland Kitchen. KLOR-CON M20 20 MEQ tablet TAKE 1 TABLET DAILY (NEEDS FOLLOW UP APPOINTMENT FOR FURTHER REFILLS)  90 tablet  0  . metoprolol succinate (TOPROL-XL) 25 MG 24 hr tablet Take 0.5 tablets (12.5 mg total) by mouth daily.  90 tablet  1  . Multiple Vitamin (MULTIVITAMIN WITH MINERALS) TABS Take 1 tablet by mouth every evening.      . Multiple Vitamins-Minerals (ICAPS) CAPS Take 1 capsule by mouth every evening.       . Omega-3 Fatty Acids (FISH OIL) 1200 MG CAPS Take 1,200 mg by mouth 2 (two) times daily.      . pantoprazole (PROTONIX) 40 MG tablet Take 40 mg by mouth daily.      Bertram Gala. Polyethyl Glycol-Propyl Glycol (SYSTANE OP) Place 1 drop into both eyes 3 (three) times daily as needed (for dry eyes).       . sertraline (ZOLOFT) 100 MG tablet Take 150 mg by mouth daily.       No current facility-administered medications for this visit.    Allergies  Allergen Reactions  . Benicar [Olmesartan Medoxomil] Other (See Comments)    Not effective    Patient Active Problem List   Diagnosis Date Noted  . Complete heart block 12/24/2011  . Advanced age 66/13/2012  . Edema 05/09/2011  . HYPERTENSION, UNSPECIFIED 09/18/2010  . SICK SINUS/ TACHY-BRADY SYNDROME 09/18/2010  . PACEMAKER 09/17/2010    History  Smoking status  . Never Smoker   Smokeless tobacco  . Not on file    History  Alcohol Use No    Family History  Problem Relation Age of Onset  . Stroke Father   . Heart failure Mother   . Leukemia  Sister     Review of Systems: Constitutional: no fever chills diaphoresis or fatigue or change in weight.  Head and neck: no hearing loss, no epistaxis, no photophobia or visual disturbance. Respiratory: No cough, shortness of breath or wheezing. Cardiovascular: No chest pain peripheral edema, palpitations. Gastrointestinal: No abdominal distention, no abdominal pain, no change in bowel habits hematochezia or melena. Genitourinary: No dysuria, no frequency, no urgency, no nocturia. Musculoskeletal:No arthralgias, no back pain, no gait disturbance or myalgias. Neurological: No dizziness, no headaches, no numbness, no seizures, no syncope, no weakness, no tremors. Hematologic: No lymphadenopathy, no easy bruising. Psychiatric: No confusion, no hallucinations, no sleep disturbance.    Physical Exam: Filed Vitals:   02/20/14 1318  BP: 126/70  Pulse: 60   the general appearance reveals an  alert elderly woman in no distress.Pupils equal and reactive.   Extraocular Movements are full.  There is no scleral icterus.  The mouth and pharynx are normal.  The neck is supple.  The carotids reveal no bruits.  The jugular venous pressure is normal.  The thyroid is not enlarged.  There is no lymphadenopathy.  The chest is clear to percussion and auscultation. There are no rales or rhonchi. Expansion of the chest is symmetrical.  Thorax reveals that she has a pacemaker in the right chest which is nonfunctional and she has a pacemaker in the left upper chest which is the new functioning pacemaker.  No murmur gallop or rub.The abdomen is soft and nontender. Bowel sounds are normal. The liver and spleen are not enlarged. There Are no abdominal masses. There are no bruits.  The pedal pulses are good.  There is no phlebitis or edema.  There is no cyanosis or clubbing. Strength is normal and symmetrical in all extremities.  There is no lateralizing weakness.  There are no sensory deficits.     Assessment / Plan: Clinically the patient is doing very well.  There is no evidence of fluid overload at this point.  Her weight is up 4 pounds but she is eating well. We will continue same medication.  Check a basal metabolic panel today.  Recheck in one year for office visit and basal metabolic panel

## 2014-02-20 NOTE — Progress Notes (Signed)
Quick Note:  Please report to patient. The recent labs are stable. Potassium is lower. Decrease HCTZ to 12.5 mg daily. Eat more high K foods. ______

## 2014-02-26 ENCOUNTER — Other Ambulatory Visit: Payer: Self-pay | Admitting: Cardiology

## 2014-03-09 ENCOUNTER — Ambulatory Visit (INDEPENDENT_AMBULATORY_CARE_PROVIDER_SITE_OTHER): Payer: Medicare Other | Admitting: *Deleted

## 2014-03-09 DIAGNOSIS — I495 Sick sinus syndrome: Secondary | ICD-10-CM

## 2014-03-09 DIAGNOSIS — Z950 Presence of cardiac pacemaker: Secondary | ICD-10-CM

## 2014-03-09 LAB — MDC_IDC_ENUM_SESS_TYPE_INCLINIC
Battery Remaining Longevity: 45 mo
Battery Voltage: 2.77 V
Brady Statistic AP VS Percent: 0 %
Lead Channel Pacing Threshold Amplitude: 0.75 V
Lead Channel Pacing Threshold Pulse Width: 0.4 ms
Lead Channel Pacing Threshold Pulse Width: 0.4 ms
Lead Channel Setting Pacing Amplitude: 2 V
Lead Channel Setting Pacing Amplitude: 2.5 V
Lead Channel Setting Pacing Pulse Width: 0.4 ms
Lead Channel Setting Sensing Sensitivity: 2.8 mV
MDC IDC MSMT BATTERY IMPEDANCE: 1063 Ohm
MDC IDC MSMT LEADCHNL RA IMPEDANCE VALUE: 445 Ohm
MDC IDC MSMT LEADCHNL RA PACING THRESHOLD AMPLITUDE: 0.5 V
MDC IDC MSMT LEADCHNL RA SENSING INTR AMPL: 2 mV
MDC IDC MSMT LEADCHNL RV IMPEDANCE VALUE: 534 Ohm
MDC IDC MSMT LEADCHNL RV SENSING INTR AMPL: 11.2 mV
MDC IDC SESS DTM: 20150514150104
MDC IDC STAT BRADY AP VP PERCENT: 89 %
MDC IDC STAT BRADY AS VP PERCENT: 11 %
MDC IDC STAT BRADY AS VS PERCENT: 0 %

## 2014-03-09 NOTE — Progress Notes (Signed)
Pacemaker check in clinic. Normal device function. Thresholds, sensing, impedances consistent with previous measurements. Device programmed to maximize longevity. 17 mode switches--- <0.1%, longest 451min26 sec, fastest 175bpm. No high ventricular rates noted. Device programmed at appropriate safety margins. Histogram distribution appropriate for patient activity level. Device programmed to optimize intrinsic conduction. Estimated longevity 3.5842yrs. ROV w/ JA 06/15/14 @ 11:45.

## 2014-03-17 ENCOUNTER — Encounter: Payer: Self-pay | Admitting: Internal Medicine

## 2014-03-21 ENCOUNTER — Telehealth: Payer: Self-pay | Admitting: Cardiology

## 2014-03-21 ENCOUNTER — Other Ambulatory Visit: Payer: Self-pay

## 2014-03-21 MED ORDER — HYDROCHLOROTHIAZIDE 25 MG PO TABS: 25.0000 mg | ORAL_TABLET | Freq: Every day | ORAL | Status: DC

## 2014-03-21 NOTE — Telephone Encounter (Signed)
New Message  Pt duaghter called. States that the pt has ran out of hydrochlorothiazide. Requests a call back to determine if the pt should continue to take the medication.. Please call

## 2014-03-21 NOTE — Telephone Encounter (Signed)
Refill for HCTZ 25 mg sent to mail order and 30 day supply called to [facility]. Daughter aware

## 2014-04-19 ENCOUNTER — Other Ambulatory Visit: Payer: Self-pay | Admitting: Internal Medicine

## 2014-06-15 ENCOUNTER — Ambulatory Visit (INDEPENDENT_AMBULATORY_CARE_PROVIDER_SITE_OTHER): Payer: Medicare Other | Admitting: Internal Medicine

## 2014-06-15 ENCOUNTER — Encounter: Payer: Self-pay | Admitting: Internal Medicine

## 2014-06-15 VITALS — BP 120/74 | HR 88 | Ht 64.0 in | Wt 142.0 lb

## 2014-06-15 DIAGNOSIS — I442 Atrioventricular block, complete: Secondary | ICD-10-CM

## 2014-06-15 DIAGNOSIS — Z950 Presence of cardiac pacemaker: Secondary | ICD-10-CM

## 2014-06-15 DIAGNOSIS — I10 Essential (primary) hypertension: Secondary | ICD-10-CM

## 2014-06-15 DIAGNOSIS — I495 Sick sinus syndrome: Secondary | ICD-10-CM

## 2014-06-15 LAB — MDC_IDC_ENUM_SESS_TYPE_INCLINIC
Battery Remaining Longevity: 42 mo
Battery Voltage: 2.76 V
Brady Statistic AP VS Percent: 0 %
Brady Statistic AS VS Percent: 0 %
Date Time Interrogation Session: 20150820153047
Lead Channel Impedance Value: 464 Ohm
Lead Channel Impedance Value: 519 Ohm
Lead Channel Pacing Threshold Amplitude: 0.75 V
Lead Channel Pacing Threshold Pulse Width: 0.4 ms
Lead Channel Pacing Threshold Pulse Width: 0.4 ms
Lead Channel Sensing Intrinsic Amplitude: 2 mV
Lead Channel Setting Pacing Amplitude: 2 V
Lead Channel Setting Pacing Pulse Width: 0.4 ms
Lead Channel Setting Sensing Sensitivity: 2.8 mV
MDC IDC MSMT BATTERY IMPEDANCE: 1198 Ohm
MDC IDC MSMT LEADCHNL RA PACING THRESHOLD AMPLITUDE: 0.5 V
MDC IDC MSMT LEADCHNL RV SENSING INTR AMPL: 11.2 mV
MDC IDC SET LEADCHNL RV PACING AMPLITUDE: 2.5 V
MDC IDC STAT BRADY AP VP PERCENT: 92 %
MDC IDC STAT BRADY AS VP PERCENT: 8 %

## 2014-06-15 NOTE — Progress Notes (Signed)
PCP:  Thora LanceEHINGER,ROBERT R, MD Primary Cardiologist:  Dr Patty SermonsBrackbill  The patient presents today for routine electrophysiology followup.  Since last being seen in our clinic, the patient reports doing well.  She remains active despite her age.  Today, she denies symptoms of palpitations, chest pain, shortness of breath, orthopnea, PND, lower extremity edema, dizziness, presyncope, syncope, or neurologic sequela.  The patient feels that she is tolerating medications without difficulties and is otherwise without complaint today.   Past Medical History  Diagnosis Date  . Hypertension   . Hypertensive cardiovascular disease   . Peripheral edema   . Chronic cough   . GERD (gastroesophageal reflux disease)   . Arthritis   . Atrial fibrillation   . Complete heart block     Has old Thera pacemaker on right side that is turned off and new MDT Versa unit on left  . Stroke    Past Surgical History  Procedure Laterality Date  . Partial hysterectomy    . Appendectomy    . Tonsillectomy and adenoidectomy    . Insert / replace / remove pacemaker  7/10  . Insert / replace / remove pacemaker  5/79  . Insert / replace / remove pacemaker  7/95  . Shoulder surgery    . Tendon repair  07/20/2012    Procedure: TENDON REPAIR;  Surgeon: Nicki ReaperGary R Kuzma, MD;  Location: [facility];  Service: Orthopedics;  Laterality: Left;  Reconstruction Extensor hood Left middle finger and left ring finger  . Repair extensor tendon Right 12/17/2012    Procedure: REPAIR EXTENSOR TENDON;  Surgeon: Nicki ReaperGary R Kuzma, MD;  Location:  SURGERY CENTER;  Service: Orthopedics;  Laterality: Right;  CENTRALIZATION EXTENSOR TENDON RIGHT RING FINGER/RIGHT SMALL FINGER ANESTHESIA    Current Outpatient Prescriptions  Medication Sig Dispense Refill  . aspirin EC 81 MG tablet Take 81 mg by mouth daily.      Marland Kitchen. b complex vitamins tablet Take 1 tablet by mouth every evening.      Marland Kitchen. Bioflavonoid Products (ESTER-C) 500-200-60 MG  TABS Take 1 tablet by mouth every evening.      . calcium carbonate (OS-CAL) 600 MG TABS Take 600 mg by mouth 2 (two) times daily with a meal.      . Cholecalciferol (VITAMIN D-3) 5000 UNITS TABS Take 1 tablet by mouth every evening.      . diphenhydrAMINE (BENADRYL) 25 MG tablet Take 12.5 mg by mouth at bedtime.       . Flaxseed, Linseed, (FLAXSEED OIL PO) Take 2,400 mg by mouth 2 (two) times daily.      . hydrochlorothiazide (HYDRODIURIL) 25 MG tablet Take 1 tablet (25 mg total) by mouth daily.  90 tablet  3  . KLOR-CON M20 20 MEQ tablet TAKE 1 TABLET DAILY (NO REFILLS UNTIL APPOINTMENT 02/20/2014)  90 tablet  3  . metoprolol succinate (TOPROL-XL) 25 MG 24 hr tablet TAKE ONE-HALF (1/2) TABLET (12.5 MG TOTAL) DAILY  45 tablet  0  . Multiple Vitamin (MULTIVITAMIN WITH MINERALS) TABS Take 1 tablet by mouth every evening.      . Multiple Vitamins-Minerals (ICAPS) CAPS Take 1 capsule by mouth every evening.      . Omega-3 Fatty Acids (FISH OIL) 1200 MG CAPS Take 1,200 mg by mouth 2 (two) times daily.      . pantoprazole (PROTONIX) 40 MG tablet Take 40 mg by mouth daily.      Bertram Gala. Polyethyl Glycol-Propyl Glycol (SYSTANE OP) Place 1 drop into both  eyes 3 (three) times daily as needed (for dry eyes).       . sertraline (ZOLOFT) 100 MG tablet Take 150 mg by mouth daily.       No current facility-administered medications for this visit.    Allergies  Allergen Reactions  . Benicar [Olmesartan Medoxomil] Other (See Comments)    Not effective    History   Social History  . Marital Status: Widowed    Spouse Name: N/A    Number of Children: N/A  . Years of Education: N/A   Occupational History  . Not on file.   Social History Main Topics  . Smoking status: Never Smoker   . Smokeless tobacco: Not on file  . Alcohol Use: No  . Drug Use: No  . Sexual Activity: No   Other Topics Concern  . Not on file   Social History Narrative  . No narrative on file   ROS- all systems are reviewed and  negative except as per HPI above  Family History  Problem Relation Age of Onset  . Stroke Father   . Heart failure Mother   . Leukemia Sister     Physical Exam: Filed Vitals:   06/15/14 1157  BP: 120/74  Pulse: 88  Height: 5\' 4"  (1.626 m)  Weight: 142 lb (64.411 kg)    GEN- The patient is elderly appearing, alert and oriented x 3 today.   Head- normocephalic, atraumatic Eyes-  Sclera clear, conjunctiva pink Ears- hearing intact Oropharynx- clear Neck- supple, no JVP Lymph- no cervical lymphadenopathy Lungs- Clear to ausculation bilaterally, normal work of breathing Chest- pacemaker pocket is well healed Heart- Regular rate and rhythm, no murmurs, rubs or gallops, PMI not laterally displaced GI- soft, NT, ND, + BS Extremities- no clubbing, cyanosis, or edema  ekg today reveals AV sequential pacing at 84 bpm  Pacemaker interrogation- reviewed in detail today,  See PACEART report  Assessment and Plan:  1. Complete heart block Normal pacemaker function See Pace Art report No changes today  2. Afib No afib since last interrogation Not felt to be a candidate by Dr Patty Sermons for anticoagulation  3. HTN Stable No change required today   She is willing to begin remote monitoring I will see again in 1 year

## 2014-06-15 NOTE — Patient Instructions (Signed)
Your physician recommends that you continue on your current medications as directed. Please refer to the Current Medication list given to you today. Your physician wants you to follow-up in: 6 MONTHS WITH DEVICE CLINIC.  You will receive a reminder letter in the mail two months in advance. If you don't receive a letter, please call our office to schedule the follow-up appointment. Your physician wants you to follow-up in: 12 MONTHS WITH DR Johney FrameALLRED.  You will receive a reminder letter in the mail two months in advance. If you don't receive a letter, please call our office to schedule the follow-up appointment.

## 2014-06-21 ENCOUNTER — Encounter: Payer: Self-pay | Admitting: Cardiology

## 2014-07-18 ENCOUNTER — Other Ambulatory Visit: Payer: Self-pay | Admitting: Internal Medicine

## 2014-09-14 ENCOUNTER — Encounter: Payer: Medicare Other | Admitting: *Deleted

## 2014-09-14 ENCOUNTER — Telehealth: Payer: Self-pay | Admitting: Cardiology

## 2014-09-14 NOTE — Telephone Encounter (Signed)
Confirmed remote transmission with pt daughter.  

## 2014-09-15 ENCOUNTER — Encounter: Payer: Self-pay | Admitting: Cardiology

## 2014-10-04 ENCOUNTER — Inpatient Hospital Stay (HOSPITAL_COMMUNITY)
Admission: EM | Admit: 2014-10-04 | Discharge: 2014-10-07 | DRG: 563 | Disposition: A | Discharge status: Skilled Nursing Facility | Payer: Medicare Other | Attending: Internal Medicine | Admitting: Internal Medicine

## 2014-10-04 ENCOUNTER — Emergency Department (HOSPITAL_COMMUNITY): Payer: Medicare Other

## 2014-10-04 ENCOUNTER — Encounter (HOSPITAL_COMMUNITY): Payer: Self-pay

## 2014-10-04 DIAGNOSIS — Z950 Presence of cardiac pacemaker: Secondary | ICD-10-CM | POA: Diagnosis present

## 2014-10-04 DIAGNOSIS — W010XXA Fall on same level from slipping, tripping and stumbling without subsequent striking against object, initial encounter: Secondary | ICD-10-CM | POA: Diagnosis present

## 2014-10-04 DIAGNOSIS — I10 Essential (primary) hypertension: Secondary | ICD-10-CM

## 2014-10-04 DIAGNOSIS — Z7982 Long term (current) use of aspirin: Secondary | ICD-10-CM | POA: Diagnosis not present

## 2014-10-04 DIAGNOSIS — S42211A Unspecified displaced fracture of surgical neck of right humerus, initial encounter for closed fracture: Secondary | ICD-10-CM | POA: Diagnosis not present

## 2014-10-04 DIAGNOSIS — I119 Hypertensive heart disease without heart failure: Secondary | ICD-10-CM | POA: Diagnosis present

## 2014-10-04 DIAGNOSIS — Z66 Do not resuscitate: Secondary | ICD-10-CM | POA: Diagnosis present

## 2014-10-04 DIAGNOSIS — F329 Major depressive disorder, single episode, unspecified: Secondary | ICD-10-CM | POA: Diagnosis present

## 2014-10-04 DIAGNOSIS — I4891 Unspecified atrial fibrillation: Secondary | ICD-10-CM | POA: Diagnosis present

## 2014-10-04 DIAGNOSIS — Z9181 History of falling: Secondary | ICD-10-CM | POA: Diagnosis not present

## 2014-10-04 DIAGNOSIS — S42301A Unspecified fracture of shaft of humerus, right arm, initial encounter for closed fracture: Secondary | ICD-10-CM | POA: Diagnosis present

## 2014-10-04 DIAGNOSIS — N390 Urinary tract infection, site not specified: Secondary | ICD-10-CM | POA: Diagnosis present

## 2014-10-04 DIAGNOSIS — T502X5A Adverse effect of carbonic-anhydrase inhibitors, benzothiadiazides and other diuretics, initial encounter: Secondary | ICD-10-CM | POA: Diagnosis present

## 2014-10-04 DIAGNOSIS — K219 Gastro-esophageal reflux disease without esophagitis: Secondary | ICD-10-CM | POA: Diagnosis present

## 2014-10-04 DIAGNOSIS — S42213A Unspecified displaced fracture of surgical neck of unspecified humerus, initial encounter for closed fracture: Secondary | ICD-10-CM | POA: Insufficient documentation

## 2014-10-04 DIAGNOSIS — Y92013 Bedroom of single-family (private) house as the place of occurrence of the external cause: Secondary | ICD-10-CM | POA: Diagnosis not present

## 2014-10-04 DIAGNOSIS — E871 Hypo-osmolality and hyponatremia: Secondary | ICD-10-CM | POA: Diagnosis present

## 2014-10-04 DIAGNOSIS — W1830XA Fall on same level, unspecified, initial encounter: Secondary | ICD-10-CM | POA: Diagnosis present

## 2014-10-04 DIAGNOSIS — Z79899 Other long term (current) drug therapy: Secondary | ICD-10-CM | POA: Diagnosis not present

## 2014-10-04 DIAGNOSIS — E876 Hypokalemia: Secondary | ICD-10-CM | POA: Diagnosis present

## 2014-10-04 DIAGNOSIS — Z8673 Personal history of transient ischemic attack (TIA), and cerebral infarction without residual deficits: Secondary | ICD-10-CM | POA: Diagnosis not present

## 2014-10-04 DIAGNOSIS — B964 Proteus (mirabilis) (morganii) as the cause of diseases classified elsewhere: Secondary | ICD-10-CM | POA: Diagnosis present

## 2014-10-04 DIAGNOSIS — M79621 Pain in right upper arm: Secondary | ICD-10-CM | POA: Diagnosis not present

## 2014-10-04 DIAGNOSIS — W19XXXA Unspecified fall, initial encounter: Secondary | ICD-10-CM

## 2014-10-04 DIAGNOSIS — I495 Sick sinus syndrome: Secondary | ICD-10-CM | POA: Diagnosis present

## 2014-10-04 LAB — CBC WITH DIFFERENTIAL/PLATELET
BASOS ABS: 0 10*3/uL (ref 0.0–0.1)
BASOS PCT: 0 % (ref 0–1)
Eosinophils Absolute: 0 10*3/uL (ref 0.0–0.7)
Eosinophils Relative: 0 % (ref 0–5)
HCT: 32.4 % — ABNORMAL LOW (ref 36.0–46.0)
Hemoglobin: 10.7 g/dL — ABNORMAL LOW (ref 12.0–15.0)
Lymphocytes Relative: 11 % — ABNORMAL LOW (ref 12–46)
Lymphs Abs: 1 10*3/uL (ref 0.7–4.0)
MCH: 27.2 pg (ref 26.0–34.0)
MCHC: 33 g/dL (ref 30.0–36.0)
MCV: 82.2 fL (ref 78.0–100.0)
MONO ABS: 0.4 10*3/uL (ref 0.1–1.0)
Monocytes Relative: 5 % (ref 3–12)
NEUTROS ABS: 7.7 10*3/uL (ref 1.7–7.7)
NEUTROS PCT: 84 % — AB (ref 43–77)
Platelets: 202 10*3/uL (ref 150–400)
RBC: 3.94 MIL/uL (ref 3.87–5.11)
RDW: 13.7 % (ref 11.5–15.5)
WBC: 9.1 10*3/uL (ref 4.0–10.5)

## 2014-10-04 LAB — URINALYSIS, ROUTINE W REFLEX MICROSCOPIC
BILIRUBIN URINE: NEGATIVE
GLUCOSE, UA: NEGATIVE mg/dL
HGB URINE DIPSTICK: NEGATIVE
Ketones, ur: NEGATIVE mg/dL
Nitrite: POSITIVE — AB
Protein, ur: NEGATIVE mg/dL
SPECIFIC GRAVITY, URINE: 1.021 (ref 1.005–1.030)
Urobilinogen, UA: 1 mg/dL (ref 0.0–1.0)
pH: 7.5 (ref 5.0–8.0)

## 2014-10-04 LAB — BASIC METABOLIC PANEL
ANION GAP: 15 (ref 5–15)
BUN: 22 mg/dL (ref 6–23)
CALCIUM: 10.2 mg/dL (ref 8.4–10.5)
CO2: 24 mEq/L (ref 19–32)
CREATININE: 0.67 mg/dL (ref 0.50–1.10)
Chloride: 95 mEq/L — ABNORMAL LOW (ref 96–112)
GFR calc Af Amer: 86 mL/min — ABNORMAL LOW (ref >90)
GFR calc non Af Amer: 75 mL/min — ABNORMAL LOW (ref >90)
Glucose, Bld: 130 mg/dL — ABNORMAL HIGH (ref 70–99)
Potassium: 3 mEq/L — ABNORMAL LOW (ref 3.7–5.3)
Sodium: 134 mEq/L — ABNORMAL LOW (ref 137–147)

## 2014-10-04 LAB — URINE MICROSCOPIC-ADD ON

## 2014-10-04 MED ORDER — HYDROCODONE-ACETAMINOPHEN 5-325 MG PO TABS
1.0000 | ORAL_TABLET | ORAL | Status: DC | PRN
  Administered 2014-10-04: 1 via ORAL
  Administered 2014-10-05: 2 via ORAL
  Filled 2014-10-04: qty 1
  Filled 2014-10-04: qty 2

## 2014-10-04 MED ORDER — VITAMIN D3 25 MCG (1000 UNIT) PO TABS
5000.0000 [IU] | ORAL_TABLET | Freq: Every day | ORAL | Status: DC
  Administered 2014-10-05 – 2014-10-07 (×3): 5000 [IU] via ORAL
  Filled 2014-10-04 (×4): qty 5

## 2014-10-04 MED ORDER — ONDANSETRON HCL 4 MG/2ML IJ SOLN: 4.0000 mg | Freq: Four times a day (QID) | INTRAMUSCULAR | Status: DC | PRN

## 2014-10-04 MED ORDER — POLYETHYL GLYCOL-PROPYL GLYCOL 0.4-0.3 % OP SOLN: 1.0000 [drp] | Freq: Two times a day (BID) | OPHTHALMIC | Status: DC

## 2014-10-04 MED ORDER — POTASSIUM CHLORIDE CRYS ER 20 MEQ PO TBCR
20.0000 meq | EXTENDED_RELEASE_TABLET | Freq: Once | ORAL | Status: AC
  Administered 2014-10-04: 20 meq via ORAL
  Filled 2014-10-04: qty 1

## 2014-10-04 MED ORDER — POLYVINYL ALCOHOL 1.4 % OP SOLN
1.0000 [drp] | Freq: Two times a day (BID) | OPHTHALMIC | Status: DC
  Administered 2014-10-05 – 2014-10-07 (×6): 1 [drp] via OPHTHALMIC
  Filled 2014-10-04: qty 15

## 2014-10-04 MED ORDER — HYDROCODONE-ACETAMINOPHEN 5-325 MG PO TABS
0.5000 | ORAL_TABLET | Freq: Once | ORAL | Status: AC
  Administered 2014-10-04: 0.5 via ORAL

## 2014-10-04 MED ORDER — ACETAMINOPHEN 325 MG PO TABS: 650.0000 mg | ORAL_TABLET | Freq: Four times a day (QID) | ORAL | Status: DC | PRN

## 2014-10-04 MED ORDER — CEFTRIAXONE SODIUM IN DEXTROSE 20 MG/ML IV SOLN
1.0000 g | INTRAVENOUS | Status: DC
  Administered 2014-10-05 – 2014-10-07 (×3): 1 g via INTRAVENOUS
  Filled 2014-10-04 (×3): qty 50

## 2014-10-04 MED ORDER — SERTRALINE HCL 100 MG PO TABS
100.0000 mg | ORAL_TABLET | Freq: Every day | ORAL | Status: DC
  Administered 2014-10-05 – 2014-10-07 (×3): 100 mg via ORAL
  Filled 2014-10-04 (×4): qty 1

## 2014-10-04 MED ORDER — B COMPLEX-C PO TABS
1.0000 | ORAL_TABLET | Freq: Every day | ORAL | Status: DC
  Administered 2014-10-05 – 2014-10-06 (×3): 1 via ORAL
  Filled 2014-10-04 (×4): qty 1

## 2014-10-04 MED ORDER — POTASSIUM CHLORIDE CRYS ER 20 MEQ PO TBCR
20.0000 meq | EXTENDED_RELEASE_TABLET | Freq: Every day | ORAL | Status: DC
  Administered 2014-10-05: 20 meq via ORAL
  Filled 2014-10-04 (×2): qty 1

## 2014-10-04 MED ORDER — ACETAMINOPHEN 650 MG RE SUPP: 650.0000 mg | Freq: Four times a day (QID) | RECTAL | Status: DC | PRN

## 2014-10-04 MED ORDER — METOPROLOL SUCCINATE 12.5 MG HALF TABLET
12.5000 mg | ORAL_TABLET | Freq: Every day | ORAL | Status: DC
  Administered 2014-10-04 – 2014-10-07 (×3): 12.5 mg via ORAL
  Filled 2014-10-04 (×4): qty 1

## 2014-10-04 MED ORDER — ASPIRIN EC 81 MG PO TBEC
81.0000 mg | DELAYED_RELEASE_TABLET | Freq: Every day | ORAL | Status: DC
  Administered 2014-10-05 – 2014-10-07 (×3): 81 mg via ORAL
  Filled 2014-10-04 (×4): qty 1

## 2014-10-04 MED ORDER — ONDANSETRON HCL 4 MG PO TABS: 4.0000 mg | ORAL_TABLET | Freq: Four times a day (QID) | ORAL | Status: DC | PRN

## 2014-10-04 MED ORDER — ENOXAPARIN SODIUM 40 MG/0.4ML ~~LOC~~ SOLN
40.0000 mg | SUBCUTANEOUS | Status: DC
  Administered 2014-10-04 – 2014-10-06 (×3): 40 mg via SUBCUTANEOUS
  Filled 2014-10-04 (×4): qty 0.4

## 2014-10-04 MED ORDER — B COMPLEX PO TABS: 1.0000 | ORAL_TABLET | Freq: Every day | ORAL | Status: DC

## 2014-10-04 MED ORDER — MORPHINE SULFATE 2 MG/ML IJ SOLN
0.5000 mg | INTRAMUSCULAR | Status: DC | PRN
  Filled 2014-10-04: qty 1

## 2014-10-04 MED ORDER — DEXTROSE 5 % IV SOLN
1.0000 g | Freq: Once | INTRAVENOUS | Status: AC
  Administered 2014-10-04: 1 g via INTRAVENOUS
  Filled 2014-10-04: qty 10

## 2014-10-04 MED ORDER — ADULT MULTIVITAMIN W/MINERALS CH
1.0000 | ORAL_TABLET | Freq: Every day | ORAL | Status: DC
  Administered 2014-10-05 – 2014-10-06 (×3): 1 via ORAL
  Filled 2014-10-04 (×4): qty 1

## 2014-10-04 MED ORDER — HYDROCODONE-ACETAMINOPHEN 5-325 MG PO TABS
1.0000 | ORAL_TABLET | Freq: Once | ORAL | Status: DC
  Filled 2014-10-04: qty 1

## 2014-10-04 MED ORDER — VITAMIN D-3 125 MCG (5000 UT) PO TABS: 1.0000 | ORAL_TABLET | Freq: Every day | ORAL | Status: DC

## 2014-10-04 MED ORDER — PANTOPRAZOLE SODIUM 40 MG PO TBEC
40.0000 mg | DELAYED_RELEASE_TABLET | Freq: Every day | ORAL | Status: DC
  Administered 2014-10-05 – 2014-10-07 (×3): 40 mg via ORAL
  Filled 2014-10-04 (×4): qty 1

## 2014-10-04 NOTE — H&P (Signed)
History and Physical  Brenda Cook WJX:[id number] DOB: January 23, 1921 DOA: 10/04/2014   PCP: [name], MD   Chief Complaint: mechanical fall  HPI:  80 year old female with a history of hypertension, complete heart block, stroke, and atrial fibrillation presents with a mechanical fall as she was going back to her bed. The patient lost balance and fell onto her right side. This was witnessed by her daughter. There was no syncope. The patient normally is unsteady on her feet and requires the use of a walker, but even with the walker, she remains quite unsteady requiring significant assistance. However, the patient now is unable to get up secondary to pain. As a result admission was requested. The patient denies any fevers, chills, dizziness, chest pain, shortness breath, nausea, vomiting, diarrhea, abdominal pain. She does complain of dysuria for the past week. She denies any hematuria. In the emergency department, the patient was afebrile and hemodynamically stable. X-ray of the right shoulder revealed an acute right humerus neck fracture. BMP showed potassium 3.0, sodium 134, serum creatinine 0.67. Hemoglobin was 10.7, WBC 9.1. Urinalysis showed large leukocytes with nitrites. Assessment/Plan: Right humerus neck fracture -ED physician spoke with Ortho--Dr. Doneen Poisson who did not feel pt needed any surgical intervention. -Ortho recommended NWB on R-arm and f/u in office in one week -PT/OT -pain control  Bacteruria -As the patient has had dysuria and nitrites in uring, will tx empirically with ceftriaxone pending culture data  Complete heart block -The patient has a functional left PPM -R-PPM has a known fractured lead of which family is aware  Hypertension  -continue metoprolol succinate  Hyponatremia/hypokalemia  -Likely due to HCTZ  -Hold HCTZ for now  -Replete potassium  -Check magnesium  Depression  -Continue Zoloft       Past Medical History  Diagnosis  Date  . Hypertension   . Hypertensive cardiovascular disease   . Peripheral edema   . Chronic cough   . GERD (gastroesophageal reflux disease)   . Arthritis   . Atrial fibrillation   . Complete heart block     Has old Thera pacemaker on right side that is turned off and new MDT Versa unit on left  . Stroke    Past Surgical History  Procedure Laterality Date  . Partial hysterectomy    . Appendectomy    . Tonsillectomy and adenoidectomy    . Insert / replace / remove pacemaker  7/10  . Insert / replace / remove pacemaker  5/79  . Insert / replace / remove pacemaker  7/95  . Shoulder surgery    . Tendon repair  07/20/2012    Procedure: TENDON REPAIR;  Surgeon: Nicki Reaper, MD;  Location: [facility];  Service: Orthopedics;  Laterality: Left;  Reconstruction Extensor hood Left middle finger and left ring finger  . Repair extensor tendon Right 12/17/2012    Procedure: REPAIR EXTENSOR TENDON;  Surgeon: Nicki Reaper, MD;  Location: [facility];  Service: Orthopedics;  Laterality: Right;  CENTRALIZATION EXTENSOR TENDON RIGHT RING FINGER/RIGHT SMALL FINGER ANESTHESIA   Social History:  reports that she has never smoked. She does not have any smokeless tobacco history on file. She reports that she does not drink alcohol or use illicit drugs.   Family History  Problem Relation Age of Onset  . Stroke Father   . Heart failure Mother   . Leukemia Sister      Allergies  Allergen Reactions  . Benicar [Olmesartan Medoxomil]  Other (See Comments)    Not effective      Prior to Admission medications   Medication Sig Start Date End Date Taking? Authorizing Provider  aspirin EC 81 MG tablet Take 81 mg by mouth daily with breakfast.    Yes Historical Provider, MD  b complex vitamins tablet Take 1 tablet by mouth at bedtime.    Yes Historical Provider, MD  Bioflavonoid Products (ESTER-C) 500-200-60 MG TABS Take 1 tablet by mouth at bedtime.    Yes Historical  Provider, MD  Calcium Carb-Cholecalciferol (CALCIUM 600+D) 600-800 MG-UNIT TABS Take 1 tablet by mouth 2 (two) times daily.   Yes Historical Provider, MD  Cholecalciferol (VITAMIN D-3) 5000 UNITS TABS Take 1 tablet by mouth daily with breakfast.    Yes Historical Provider, MD  Flaxseed, Linseed, (FLAXSEED OIL PO) Take 2,400 mg by mouth 2 (two) times daily.   Yes Historical Provider, MD  hydrochlorothiazide (HYDRODIURIL) 25 MG tablet Take 1 tablet (25 mg total) by mouth daily. Patient taking differently: Take 12.5 mg by mouth daily.  03/21/14  Yes Cassell Clement, MD  metoprolol succinate (TOPROL-XL) 25 MG 24 hr tablet TAKE ONE-HALF (1/2) TABLET DAILY 07/18/14  Yes Hillis Range, MD  Multiple Vitamin (MULTIVITAMIN WITH MINERALS) TABS Take 1 tablet by mouth at bedtime.    Yes Historical Provider, MD  Multiple Vitamins-Minerals (ICAPS) CAPS Take 1 capsule by mouth at bedtime.   Yes Historical Provider, MD  Omega-3 Fatty Acids (FISH OIL PO) Take 1,290 mg by mouth 2 (two) times daily.   Yes Historical Provider, MD  pantoprazole (PROTONIX) 40 MG tablet Take 40 mg by mouth daily with breakfast.    Yes Historical Provider, MD  Polyethyl Glycol-Propyl Glycol (SYSTANE OP) Place 1 drop into both eyes 2 (two) times daily.    Yes Historical Provider, MD  potassium chloride SA (K-DUR,KLOR-CON) 20 MEQ tablet Take 20 mEq by mouth daily with breakfast.   Yes Historical Provider, MD  sertraline (ZOLOFT) 100 MG tablet Take 100 mg by mouth daily with breakfast.    Yes Historical Provider, MD  KLOR-CON M20 20 MEQ tablet TAKE 1 TABLET DAILY (NO REFILLS UNTIL APPOINTMENT 02/20/2014) Patient not taking: Reported on 10/04/2014    Cassell Clement, MD    Review of Systems:  Constitutional:  No weight loss, night sweats, Fevers, chills, fatigue.  Head&Eyes: No headache.  No vision loss.  No eye pain or scotoma ENT:  No Difficulty swallowing,Tooth/dental problems,Sore throat,  No ear ache, post nasal drip,    Cardio-vascular:  No chest pain, Orthopnea, PND, swelling in lower extremities,  dizziness, palpitations  GI:  No  abdominal pain, nausea, vomiting, diarrhea, loss of appetite, hematochezia, melena, heartburn, indigestion, Resp:  No shortness of breath with exertion or at rest. No cough. No coughing up of blood .No wheezing.No chest wall deformity  Skin:  no rash or lesions.  GU:  no dysuria, change in color of urine, no urgency or frequency. No flank pain.  Musculoskeletal:  No joint pain or swelling. No decreased range of motion. No back pain.  Psych:  No change in mood or affect.  Neurologic: No headache, no dysesthesia, no focal weakness, no vision loss. No syncope  Physical Exam: Filed Vitals:   10/04/14 0652 10/04/14 0701 10/04/14 0853 10/04/14 1115  BP:  146/76 148/64 114/52  Pulse:  73 74 69  Temp:  97.7 F (36.5 C)    TempSrc:  Oral    Resp:   20 16  Height:  5\' 5"  (  1.651 m)    Weight:  64.411 kg (142 lb)    SpO2: 94% 94% 94% 95%   General:  A&O x 3, NAD, nontoxic, pleasant/cooperative Head/Eye: No conjunctival hemorrhage, no icterus, Nelson/AT, No nystagmus ENT:  No icterus,  No thrush,no pharyngeal exudate Neck:  No masses, no lymphadenpathy, no bruits CV:  RRR, no rub, no gallop, no S3 Lung:  Bibasilar crackles. No wheezing. Good air movement Abdomen: soft/NT, +BS, nondistended, no peritoneal signs Ext: No cyanosis, No rashes, No petechiae, No lymphangitis, No edema;  right upper extremity in a sling--capillary refill less than 2 seconds, sensation intact, radial and ulnar pulses palpable   Labs on Admission:  Basic Metabolic Panel:  Recent Labs Lab 10/04/14 1020  NA 134*  K 3.0*  CL 95*  CO2 24  GLUCOSE 130*  BUN 22  CREATININE 0.67  CALCIUM 10.2   Liver Function Tests: No results for input(s): AST, ALT, ALKPHOS, BILITOT, PROT, ALBUMIN in the last 168 hours. No results for input(s): LIPASE, AMYLASE in the last 168 hours. No results for input(s):  AMMONIA in the last 168 hours. CBC:  Recent Labs Lab 10/04/14 1020  WBC 9.1  NEUTROABS 7.7  HGB 10.7*  HCT 32.4*  MCV 82.2  PLT 202   Cardiac Enzymes: No results for input(s): CKTOTAL, CKMB, CKMBINDEX, TROPONINI in the last 168 hours. BNP: Invalid input(s): POCBNP CBG: No results for input(s): GLUCAP in the last 168 hours.  Radiological Exams on Admission: Dg Chest 2 View  10/04/2014   CLINICAL DATA:  Fall.  EXAM: CHEST  2 VIEW  COMPARISON:  January 07, 2013.  FINDINGS: Stable cardiomediastinal silhouette. Bilateral pacemakers are again noted and unchanged. No pneumothorax or pleural effusion is noted. Moderately displaced fracture is seen involving the surgical neck of the proximal right humerus. Stable hiatal hernia is noted no acute pulmonary disease is noted.  IMPRESSION: Moderately displaced fracture involving surgical neck of the proximal right humerus. Stable hiatal hernia. No acute cardiopulmonary abnormality seen.   Electronically Signed   By: Roque LiasJames  Green M.D.   On: 10/04/2014 09:49   Dg Shoulder Right  10/04/2014   CLINICAL DATA:  Fall.  Right shoulder pain.  EXAM: RIGHT SHOULDER - 2+ VIEW  COMPARISON:  None.  FINDINGS: Acute right humeral neck fracture with angulation and impaction. Anatomic alignment at the glenohumeral joint. Osteopenia. One of the pacemaker wires is fragmented at multiple locations extending from the right chest generator pack, extending into the right subclavian vein. There is extensive opacity in the right mid and lower lung zone.  IMPRESSION: Acute right humeral neck fracture.  Fragmented right chest pacemaker lead.  Extensive right lung the opacity.  Chest radiograph is rectum   Electronically Signed   By: Maryclare BeanArt  Hoss M.D.   On: 10/04/2014 08:12   Dg Elbow Complete Right  10/04/2014   CLINICAL DATA:  80 year old female with right shoulder pain after fall. Initial encounter  EXAM: RIGHT ELBOW - COMPLETE 3+ VIEW  COMPARISON:  Concurrently obtained radiographs  of the right shoulder  FINDINGS: The bones appear osteopenic. The anterior fat pad is visualized but not particularly elevated. No definite elbow joint effusion. Chondrocalcinosis is present in the region of the radial capitellar joint. No focal soft tissue swelling. No acute fracture or malalignment.  IMPRESSION: 1. No acute fracture or malalignment. 2. Chondrocalcinosis at the radio capitellar joint.   Electronically Signed   By: Malachy MoanHeath  McCullough M.D.   On: 10/04/2014 08:12  Time spent:60 minutes Code Status:   FULL Family Communication:   Daughter updated at bedside   Reya Aurich, DO  [facility] Pager [phone]  If 7PM-7AM, please contact night-coverage [email or web address] Password [email or web address] 10/04/2014, 11:50 AM

## 2014-10-04 NOTE — Plan of Care (Signed)
Problem: Phase I Progression Outcomes Goal: Hemodynamically stable Outcome: Completed/Met Date Met:  10/04/14     

## 2014-10-04 NOTE — ED Notes (Signed)
Bed: [id number] Expected date:  Expected time:  Means of arrival:  Comments: EMS 80 yo female/fall/right arm pain

## 2014-10-04 NOTE — ED Notes (Signed)
Patient had a fall this morning.  She denies LOC.  Daughter reports her mother got tripped over her feet and fell, hitting her right arm on the footboard of the bed.

## 2014-10-04 NOTE — Plan of Care (Signed)
Problem: Phase I Progression Outcomes Goal: Voiding-avoid urinary catheter unless indicated Outcome: Completed/Met Date Met:  10/04/14     

## 2014-10-04 NOTE — ED Notes (Signed)
Patient transported to X-ray 

## 2014-10-04 NOTE — ED Provider Notes (Signed)
CSN: [id number]     Arrival date & time 10/04/14  [id number] History   First MD Initiated Contact with Patient 10/04/14 [phone]     Chief Complaint  Patient presents with  . Fall     HPI Comments: Ms. Brenda Cook presents for evaluation of injuries following a fall.  She was getting to bed at 930pm last night and her daughter was helping her.  Pt states she got tripped up over her feet and lost her balance and fell.  As she fell she twisted and hit her daughter, who was behind her, knocking them both to the ground.  The pt's son-in-law was called and he came to help her up.  Pt lives with her daughter.  Pt denies head injury, LOC, or presyncopal sxs.  She has been in her routine state of health with no recent illnesses.  She reports pain in her right elbow and pain with ROM of the ROM.  She is right handed.   Patient is a 80 y.o. female presenting with fall. The history is provided by the patient.  Fall This is a new problem. The current episode started 6 to 12 hours ago. Episode frequency: once. The problem has been gradually improving. Pertinent negatives include no chest pain, no abdominal pain, no headaches and no shortness of breath. Nothing aggravates the symptoms. Nothing relieves the symptoms.    Past Medical History  Diagnosis Date  . Hypertension   . Hypertensive cardiovascular disease   . Peripheral edema   . Chronic cough   . GERD (gastroesophageal reflux disease)   . Arthritis   . Atrial fibrillation   . Complete heart block     Has old Thera pacemaker on right side that is turned off and new MDT Versa unit on left  . Stroke    Past Surgical History  Procedure Laterality Date  . Partial hysterectomy    . Appendectomy    . Tonsillectomy and adenoidectomy    . Insert / replace / remove pacemaker  7/10  . Insert / replace / remove pacemaker  5/79  . Insert / replace / remove pacemaker  7/95  . Shoulder surgery    . Tendon repair  07/20/2012    Procedure: TENDON REPAIR;  Surgeon: Nicki ReaperGary  R Kuzma, MD;  Location: [facility];  Service: Orthopedics;  Laterality: Left;  Reconstruction Extensor hood Left middle finger and left ring finger  . Repair extensor tendon Right 12/17/2012    Procedure: REPAIR EXTENSOR TENDON;  Surgeon: Nicki ReaperGary R Kuzma, MD;  Location: [facility];  Service: Orthopedics;  Laterality: Right;  CENTRALIZATION EXTENSOR TENDON RIGHT RING FINGER/RIGHT SMALL FINGER ANESTHESIA   Family History  Problem Relation Age of Onset  . Stroke Father   . Heart failure Mother   . Leukemia Sister    History  Substance Use Topics  . Smoking status: Never Smoker   . Smokeless tobacco: Not on file  . Alcohol Use: No   OB History    No data available     Review of Systems  Respiratory: Negative for shortness of breath.   Cardiovascular: Negative for chest pain.  Gastrointestinal: Negative for abdominal pain.  Neurological: Negative for headaches.  All other systems reviewed and are negative.     Allergies  Benicar  Home Medications   Prior to Admission medications   Medication Sig Start Date End Date Taking? Authorizing Provider  aspirin EC 81 MG tablet Take 81 mg by mouth daily.  Historical Provider, MD  b complex vitamins tablet Take 1 tablet by mouth every evening.    Historical Provider, MD  Bioflavonoid Products (ESTER-C) 500-200-60 MG TABS Take 1 tablet by mouth every evening.    Historical Provider, MD  calcium carbonate (OS-CAL) 600 MG TABS Take 600 mg by mouth 2 (two) times daily with a meal.    Historical Provider, MD  Cholecalciferol (VITAMIN D-3) 5000 UNITS TABS Take 1 tablet by mouth every evening.    Historical Provider, MD  diphenhydrAMINE (BENADRYL) 25 MG tablet Take 12.5 mg by mouth at bedtime.     Historical Provider, MD  Flaxseed, Linseed, (FLAXSEED OIL PO) Take 2,400 mg by mouth 2 (two) times daily.    Historical Provider, MD  hydrochlorothiazide (HYDRODIURIL) 25 MG tablet Take 1 tablet (25 mg total) by mouth  daily. 03/21/14   Cassell Clement, MD  KLOR-CON M20 20 MEQ tablet TAKE 1 TABLET DAILY (NO REFILLS UNTIL APPOINTMENT 02/20/2014)    Cassell Clement, MD  metoprolol succinate (TOPROL-XL) 25 MG 24 hr tablet TAKE ONE-HALF (1/2) TABLET DAILY 07/18/14   Hillis Range, MD  Multiple Vitamin (MULTIVITAMIN WITH MINERALS) TABS Take 1 tablet by mouth every evening.    Historical Provider, MD  Multiple Vitamins-Minerals (ICAPS) CAPS Take 1 capsule by mouth every evening.    Historical Provider, MD  Omega-3 Fatty Acids (FISH OIL) 1200 MG CAPS Take 1,200 mg by mouth 2 (two) times daily.    Historical Provider, MD  pantoprazole (PROTONIX) 40 MG tablet Take 40 mg by mouth daily.    Historical Provider, MD  Polyethyl Glycol-Propyl Glycol (SYSTANE OP) Place 1 drop into both eyes 3 (three) times daily as needed (for dry eyes).     Historical Provider, MD  sertraline (ZOLOFT) 100 MG tablet Take 150 mg by mouth daily.    Historical Provider, MD   BP 146/76 mmHg  Pulse 73  Temp(Src) 97.7 F (36.5 C) (Oral)  Ht 5\' 5"  (1.651 m)  Wt 142 lb (64.411 kg)  BMI 23.63 kg/m2  SpO2 94% Physical Exam  Constitutional: She appears well-developed and well-nourished.  HENT:  Head: Normocephalic and atraumatic.  Eyes: Pupils are equal, round, and reactive to light.  Cardiovascular: Normal rate and regular rhythm.   No murmur heard. Pulmonary/Chest: Effort normal and breath sounds normal. No respiratory distress.  Abdominal: Soft. There is no tenderness. There is no rebound and no guarding.  Musculoskeletal:  TTP over right posterior lateral elbow with flexion as position of comfort.  Ecchymosis over upper arm without local tenderness.  2+ radial pulses.  No hand/wrist/distal forearm tenderness.  No tenderness in bilateral hips.  Neurological: She is alert.  Disoriented to time.  5/5 grip strength in BUE.  5/5 strength in BLE.  Sensation to light touch intact throughout all four extremities.    Skin: Skin is warm and dry.   Psychiatric: She has a normal mood and affect. Her behavior is normal.  Nursing note and vitals reviewed.   ED Course  Procedures (including critical care time) Labs Review Labs Reviewed - No data to display  Imaging Review No results found.   EKG Interpretation None      MDM   Final diagnoses:  Fall  Humeral surgical neck fracture, right, closed, initial encounter  Acute urinary tract infection    Discussed pt with Dr. Magnus Ivan with ortho who recommends sling for comfort and outpatient follow up in one week.   Pt is unable to ambulate and is unable to get around  at home due to new injury, UA c/w UTI.  D/w medicine regarding admission for further evaluation and mgmt.     Tilden FossaElizabeth Autumm Hattery, MD 10/04/14 1723

## 2014-10-05 DIAGNOSIS — E876 Hypokalemia: Secondary | ICD-10-CM

## 2014-10-05 DIAGNOSIS — Z950 Presence of cardiac pacemaker: Secondary | ICD-10-CM

## 2014-10-05 LAB — MAGNESIUM: Magnesium: 2.1 mg/dL (ref 1.5–2.5)

## 2014-10-05 LAB — BASIC METABOLIC PANEL
Anion gap: 13 (ref 5–15)
BUN: 22 mg/dL (ref 6–23)
CO2: 24 mEq/L (ref 19–32)
Calcium: 9.7 mg/dL (ref 8.4–10.5)
Chloride: 97 mEq/L (ref 96–112)
Creatinine, Ser: 0.71 mg/dL (ref 0.50–1.10)
GFR calc Af Amer: 85 mL/min — ABNORMAL LOW (ref >90)
GFR, EST NON AFRICAN AMERICAN: 73 mL/min — AB (ref >90)
Glucose, Bld: 126 mg/dL — ABNORMAL HIGH (ref 70–99)
Potassium: 3.2 mEq/L — ABNORMAL LOW (ref 3.7–5.3)
Sodium: 134 mEq/L — ABNORMAL LOW (ref 137–147)

## 2014-10-05 LAB — CBC
HCT: 31.9 % — ABNORMAL LOW (ref 36.0–46.0)
Hemoglobin: 10.3 g/dL — ABNORMAL LOW (ref 12.0–15.0)
MCH: 27 pg (ref 26.0–34.0)
MCHC: 32.3 g/dL (ref 30.0–36.0)
MCV: 83.5 fL (ref 78.0–100.0)
Platelets: 167 10*3/uL (ref 150–400)
RBC: 3.82 MIL/uL — ABNORMAL LOW (ref 3.87–5.11)
RDW: 13.9 % (ref 11.5–15.5)
WBC: 7.4 10*3/uL (ref 4.0–10.5)

## 2014-10-05 MED ORDER — KETOROLAC TROMETHAMINE 15 MG/ML IJ SOLN
15.0000 mg | Freq: Four times a day (QID) | INTRAMUSCULAR | Status: DC | PRN
  Administered 2014-10-05 – 2014-10-06 (×2): 15 mg via INTRAVENOUS
  Filled 2014-10-05 (×2): qty 1

## 2014-10-05 MED ORDER — POTASSIUM CHLORIDE CRYS ER 20 MEQ PO TBCR
40.0000 meq | EXTENDED_RELEASE_TABLET | Freq: Every day | ORAL | Status: DC
  Administered 2014-10-05 – 2014-10-07 (×3): 40 meq via ORAL
  Filled 2014-10-05 (×4): qty 2

## 2014-10-05 MED ORDER — ACETAMINOPHEN 500 MG PO TABS
1000.0000 mg | ORAL_TABLET | Freq: Three times a day (TID) | ORAL | Status: DC
  Administered 2014-10-05 – 2014-10-07 (×6): 1000 mg via ORAL
  Filled 2014-10-05 (×10): qty 2

## 2014-10-05 MED ORDER — POTASSIUM CHLORIDE CRYS ER 20 MEQ PO TBCR
20.0000 meq | EXTENDED_RELEASE_TABLET | Freq: Once | ORAL | Status: DC
  Filled 2014-10-05: qty 1

## 2014-10-05 MED ORDER — SODIUM CHLORIDE 0.9 % IV SOLN
1000.0000 mL | Freq: Once | INTRAVENOUS | Status: AC
  Administered 2014-10-05: 1000 mL via INTRAVENOUS

## 2014-10-05 NOTE — Care Management Note (Signed)
    Page 1 of 1   10/05/2014     3:15:04 PM CARE MANAGEMENT NOTE 10/05/2014  Patient:  Brenda Cook,Brenda Cook   Account Number:  [id number]  Date Initiated:  10/05/2014  Documentation initiated by:  Brenda Cook,Brenda Cook  Subjective/Objective Assessment:   adm: acute right humerus neck fracture/UA was positive for UTI.     Action/Plan:   discharge planning   Anticipated DC Date:  10/05/2014   Anticipated DC Plan:  SKILLED NURSING FACILITY         Choice offered to / List presented to:             Status of service:  Completed, signed off Medicare Important Message given?   (If response is "NO", the following Medicare IM given date fields will be blank) Date Medicare IM given:   Medicare IM given by:   Date Additional Medicare IM given:   Additional Medicare IM given by:    Discharge Disposition:  SKILLED NURSING FACILITY  Per UR Regulation:  Reviewed for med. necessity/level of care/duration of stay  If discussed at Long Length of Stay Meetings, dates discussed:    Comments:  10/05/14 15:00 CM notes pt to go to SNF; CSW arranging.  No other CM needs were communicated.  Brenda Cook, BSN, CM [phone].

## 2014-10-05 NOTE — Progress Notes (Signed)
CSW assisting with d/c planning. Pt / daughter have chosen [facility] ( [facility]. CSW will continue to follow to assist with d/c planning to SNF.  Cori RazorJamie Kimiye Strathman LCSW [phone]

## 2014-10-05 NOTE — Progress Notes (Signed)
Patient ID: Brenda Cook  female  [id number]    DOB: 02-May-1921    DOA: 10/04/2014  PCP: Brenda Cook  Brief narrative  Patient is a 80 year old female with hypertension, complete heart block, stroke, atrial fibrillation presented with mechanical fall as she was going back to her bed. Patient lost her balance and fell onto her right side, witnessed by her daughter. No syncope. X-ray of the right shoulder revealed acute right humerus neck fracture, potassium 3.0, sodium 134, creatinine 0.67 UA was positive for UTI.  Assessment/Plan: Principal Problem:   Fracture of right humerus - EDV spoke with Lona MillardArthur O, Dr. Allie Bossierhris Blackman who did not feel patient needed any surgical intervention - Orthopedics recommended NWB on the right arm and follow up in office in 1 week - PT, OT evaluation, pain control - discontinue narcotics, place on scheduled Tylenol and Toradol as needed for severe pain  Active Problems:   Essential hypertension - Currently stable    SICK SINUS/ TACHY-BRADY SYNDROME/   PACEMAKER - Patient has a functional left PPM, R PPM has known fractured lead of which family is aware.    Hyponatremia, hypokalemia -Likely due to HCTZ, potassium replaced - Hold HCTZ, placed on gentle hydration  UTI Follow urine culture and sensitivities, continue IV Rocephin for now   Depression Continue Zoloft  DVT Prophylaxis:Lovenox  Code Status:Full code  Family Communication:  Disposition:  Consultants: Orthopedics   Procedures: None  Antibiotics:  IV Rocephin  Subjective: Patient seen and examined, denies any specific complaints, no acute issues overnight  Objective: Weight change:   Intake/Output Summary (Last 24 hours) at 10/05/14 1202 Last data filed at 10/05/14 0150  Gross per 24 hour  Intake    120 ml  Output    175 ml  Net    -55 ml   Blood pressure 106/58, pulse 62, temperature 98.4 F (36.9 C), temperature source Oral, resp. rate 20, height 5'  6" (1.676 m), weight 64.411 kg (142 lb), SpO2 95 %.  Physical Exam: General: Alert and awake, oriented x2, NAD CVS: S1-S2 clear, no murmur rubs or gallops Chest: clear to auscultation bilaterally, no wheezing, rales or rhonchi Abdomen: soft nontender, nondistended, normal bowel sounds  Extremities: no cyanosis, clubbing or edema noted bilaterally Neuro: Cranial nerves II-XII intact, no focal neurological deficits  Lab Results: Basic Metabolic Panel:  Recent Labs Lab 10/04/14 1020 10/05/14 0505  NA 134* 134*  K 3.0* 3.2*  CL 95* 97  CO2 24 24  GLUCOSE 130* 126*  BUN 22 22  CREATININE 0.67 0.71  CALCIUM 10.2 9.7  MG  --  2.1   Liver Function Tests: No results for input(s): AST, ALT, ALKPHOS, BILITOT, PROT, ALBUMIN in the last 168 hours. No results for input(s): LIPASE, AMYLASE in the last 168 hours. No results for input(s): AMMONIA in the last 168 hours. CBC:  Recent Labs Lab 10/04/14 1020 10/05/14 0505  WBC 9.1 7.4  NEUTROABS 7.7  --   HGB 10.7* 10.3*  HCT 32.4* 31.9*  MCV 82.2 83.5  PLT 202 167   Cardiac Enzymes: No results for input(s): CKTOTAL, CKMB, CKMBINDEX, TROPONINI in the last 168 hours. BNP: Invalid input(s): POCBNP CBG: No results for input(s): GLUCAP in the last 168 hours.   Micro Results: No results found for this or any previous visit (from the past 240 hour(s)).  Studies/Results: Dg Chest 2 View  10/04/2014   CLINICAL DATA:  Fall.  EXAM: CHEST  2 VIEW  COMPARISON:  January 07, 2013.  FINDINGS: Stable cardiomediastinal silhouette. Bilateral pacemakers are again noted and unchanged. No pneumothorax or pleural effusion is noted. Moderately displaced fracture is seen involving the surgical neck of the proximal right humerus. Stable hiatal hernia is noted no acute pulmonary disease is noted.  IMPRESSION: Moderately displaced fracture involving surgical neck of the proximal right humerus. Stable hiatal hernia. No acute cardiopulmonary abnormality seen.    Electronically Signed   By: Roque LiasJames  Green M.D.   On: 10/04/2014 09:49   Dg Shoulder Right  10/04/2014   CLINICAL DATA:  Fall.  Right shoulder pain.  EXAM: RIGHT SHOULDER - 2+ VIEW  COMPARISON:  None.  FINDINGS: Acute right humeral neck fracture with angulation and impaction. Anatomic alignment at the glenohumeral joint. Osteopenia. One of the pacemaker wires is fragmented at multiple locations extending from the right chest generator pack, extending into the right subclavian vein. There is extensive opacity in the right mid and lower lung zone.  IMPRESSION: Acute right humeral neck fracture.  Fragmented right chest pacemaker lead.  Extensive right lung the opacity.  Chest radiograph is rectum   Electronically Signed   By: Maryclare BeanArt  Hoss M.D.   On: 10/04/2014 08:12   Dg Elbow Complete Right  10/04/2014   CLINICAL DATA:  80 year old female with right shoulder pain after fall. Initial encounter  EXAM: RIGHT ELBOW - COMPLETE 3+ VIEW  COMPARISON:  Concurrently obtained radiographs of the right shoulder  FINDINGS: The bones appear osteopenic. The anterior fat pad is visualized but not particularly elevated. No definite elbow joint effusion. Chondrocalcinosis is present in the region of the radial capitellar joint. No focal soft tissue swelling. No acute fracture or malalignment.  IMPRESSION: 1. No acute fracture or malalignment. 2. Chondrocalcinosis at the radio capitellar joint.   Electronically Signed   By: Malachy MoanHeath  McCullough M.D.   On: 10/04/2014 08:12    Medications: Scheduled Meds: . acetaminophen  1,000 mg Oral TID  . aspirin EC  81 mg Oral Q breakfast  . B-complex with vitamin C  1 tablet Oral QHS  . cefTRIAXone (ROCEPHIN)  IV  1 g Intravenous Q24H  . cholecalciferol  5,000 Units Oral Q breakfast  . enoxaparin (LOVENOX) injection  40 mg Subcutaneous Q24H  . metoprolol succinate  12.5 mg Oral Daily  . multivitamin with minerals  1 tablet Oral QHS  . pantoprazole  40 mg Oral Q breakfast  . polyvinyl  alcohol  1 drop Both Eyes BID  . potassium chloride  20 mEq Oral Once  . potassium chloride SA  40 mEq Oral Q breakfast  . sertraline  100 mg Oral Q breakfast      LOS: 1 day   Dodge Ator M.D. [facility] 10/05/2014, 12:02 PM Pager: [phone]  If 7PM-7AM, please contact night-coverage [email or web address] Password [email or web address]

## 2014-10-05 NOTE — Progress Notes (Signed)
Physical Therapy Treatment Patient Details Name: Brenda Cook MRN: [id number] DOB: 05-16-1921 Today's Date: 10/05/2014    History of Present Illness This 80 year old female was admitted following a fall resulting in R humeral neck fx which is being managed conservatively with sling and NWB.  She has a h/o HTN, complete heart block, stroke and A Fib.      PT Comments    Pt continues to require increased time and significant assist of 2 for all mobility tasks.  Follow Up Recommendations  SNF     Equipment Recommendations  None recommended by PT    Recommendations for Other Services OT consult     Precautions / Restrictions Precautions Precautions: Fall;Shoulder Type of Shoulder Precautions: sling Shoulder Interventions: Shoulder sling/immobilizer Restrictions Weight Bearing Restrictions: Yes RUE Weight Bearing: Non weight bearing    Mobility  Bed Mobility Overal bed mobility: Needs Assistance;+2 for physical assistance Bed Mobility: Sit to Supine     Supine to sit: Mod assist;+2 for physical assistance Sit to supine: Max assist;+2 for physical assistance   General bed mobility comments: assist with Bil LEs and to control trunk into bed.  Transfers Overall transfer level: Needs assistance Equipment used: None Transfers: Sit to/from [employer]Stand;Stand Pivot Transfers Sit to Stand: Max assist;+2 safety/equipment Stand pivot transfers: Max assist;+2 safety/equipment       General transfer comment: pt with posterior lean and with difficulty shuffling feet to move to EOB  Ambulation/Gait             General Gait Details: Stand pvt bed to chair only   Stairs            Wheelchair Mobility    Modified Rankin (Stroke Patients Only)       Balance Overall balance assessment: Needs assistance Sitting-balance support: Feet supported Sitting balance-Leahy Scale: Poor (Right posterior lean)   Postural control: Posterior lean;Right lateral lean Standing  balance support: Single extremity supported Standing balance-Leahy Scale: Zero                      Cognition Arousal/Alertness: Awake/alert Behavior During Therapy: WFL for tasks assessed/performed Overall Cognitive Status: Impaired/Different from baseline Area of Impairment: Memory               General Comments: needs reinforcement with NWB    Exercises      General Comments        Pertinent Vitals/Pain Pain Assessment: No/denies pain    Home Living Family/patient expects to be discharged to:: Skilled nursing facility Living Arrangements: Children                  Prior Function Level of Independence: Needs assistance  Gait / Transfers Assistance Needed: unsteady with walker   Comments: daughter helped pt extensively with ADLs   PT Goals (current goals can now be found in the care plan section) Acute Rehab PT Goals Patient Stated Goal: daughter would like pt to get back to baseline PT Goal Formulation: With patient/family Time For Goal Achievement: 10/12/14 Potential to Achieve Goals: Fair Progress towards PT goals: Progressing toward goals    Frequency  Min 3X/week    PT Plan Current plan remains appropriate    Co-evaluation PT/OT/SLP Co-Evaluation/Treatment: Yes Reason for Co-Treatment: For patient/therapist safety PT goals addressed during session: Mobility/safety with mobility OT goals addressed during session: ADL's and self-care     End of Session Equipment Utilized During Treatment: Gait belt Activity Tolerance: Patient limited by fatigue Patient left:  in bed;with call bell/phone within reach;with family/visitor present     Time: 1191-47821417-1432 PT Time Calculation (min) (ACUTE ONLY): 15 min  Charges:  $Therapeutic Activity: 8-22 mins                    G Codes:      Brenda Cook 10/05/2014, 3:33 PM

## 2014-10-05 NOTE — Evaluation (Signed)
Occupational Therapy Evaluation Patient Details Name: Brenda GarretHelen C Mulligan MRN: [id number] DOB: 05-23-1921 Today's Date: 10/05/2014    History of Present Illness This 80 year old female was admitted following a fall resulting in R humeral neck fx which is being managed conservatively with sling and NWB.  She has a h/o HTN, complete heart block, stroke and A Fib.     Clinical Impression   Pt was admitted for the above.  At baseline, daughter assisted with all ADLs; pt was able to SPT to commode with steadying assistance as she has difficulty with balance.  Pt will benefit from skilled OT in acute and at SNF to reach PLOF. Goals in acute are for min A for toileting and standing for LB adls.    Follow Up Recommendations  SNF    Equipment Recommendations  None recommended by OT    Recommendations for Other Services       Precautions / Restrictions Precautions Precautions: Fall;Shoulder Type of Shoulder Precautions: sling Restrictions RUE Weight Bearing: Non weight bearing      Mobility Bed Mobility Overal bed mobility: Needs Assistance Bed Mobility: Supine to Sit     Supine to sit: Mod assist;+2 for physical assistance     General bed mobility comments: utlized bedrail and HOB raised  Transfers Overall transfer level: Needs assistance Equipment used:  (hand held assist) Transfers: Sit to/from [employer]Stand;Stand Pivot Transfers Sit to Stand: Max assist;+2 safety/equipment Stand pivot transfers: Max assist;+2 safety/equipment       General transfer comment: pt has posterior lean and had difficulty lifting RLE in standing.  She held onto therapist's arm and attempted to push through RUE--therapist moved arm to avoid this.  Pt did step over to chair with max assistance    Balance Overall balance assessment: Needs assistance;History of Falls Sitting-balance support: Feet supported Sitting balance-Leahy Scale: Poor (posterior and right lean)     Standing balance support: Single  extremity supported Standing balance-Leahy Scale: Zero                              ADL Overall ADL's : Needs assistance/impaired                                       General ADL Comments: Daughter helped pt extensively with ADLs.  Pt is requiring more assistance to stand and pivot at this time.  Pt has a h/o unsteadiness     Vision                     Perception     Praxis      Pertinent Vitals/Pain Pain Assessment: No/denies pain     Hand Dominance Right   Extremity/Trunk Assessment Upper Extremity Assessment Upper Extremity Assessment: RUE deficits/detail RUE Deficits / Details: immobilized in sling; able to move fingers           Communication Communication Communication:  (slow speech; gets SOB). This is normal per daughter   Cognition Arousal/Alertness: Awake/alert Behavior During Therapy: WFL for tasks assessed/performed Overall Cognitive Status: Impaired/Different from baseline Area of Impairment: Memory               General Comments: needs reinforcement with NWB   General Comments       Exercises       Shoulder Instructions      Home Living  Family/patient expects to be discharged to:: Skilled nursing facility                                        Prior Functioning/Environment Level of Independence: Needs assistance        Comments: daughter helped pt extensively with ADLs    OT Diagnosis: Generalized weakness   OT Problem List: Decreased strength;Decreased activity tolerance;Impaired balance (sitting and/or standing);Impaired vision/perception;Decreased safety awareness;Decreased knowledge of use of DME or AE;Impaired UE functional use   OT Treatment/Interventions: Self-care/ADL training;DME and/or AE instruction;Balance training;Patient/family education    OT Goals(Current goals can be found in the care plan section) Acute Rehab OT Goals Patient Stated Goal: daughter would  like pt to get back to baseline OT Goal Formulation: With family Time For Goal Achievement: 10/12/14 Potential to Achieve Goals: Fair ADL Goals Pt Will Transfer to Toilet: with min assist;bedside commode;stand pivot transfer Additional ADL Goal #1: pt will maintain static standing with hemiwalker with min A x 2 minutes for adls.  OT Frequency: Min 2X/week   Barriers to D/C:            Co-evaluation PT/OT/SLP Co-Evaluation/Treatment: Yes Reason for Co-Treatment: For patient/therapist safety PT goals addressed during session: Mobility/safety with mobility OT goals addressed during session: Other (comment) (mobility for adls)      End of Session Nurse Communication: Mobility status  Activity Tolerance: Patient limited by fatigue Patient left: in chair;with call bell/phone within reach;with family/visitor present   Time: [id number] OT Time Calculation (min): 34 min Charges:  OT General Charges $OT Visit: 1 Procedure OT Evaluation $Initial OT Evaluation Tier I: 1 Procedure OT Treatments $Therapeutic Activity: 8-22 mins G-Codes:    Jarmaine Ehrler 10/05/2014, 12:46 PM Marica OtterMaryellen Aziza Stuckert, OTR/L [phone] 10/05/2014

## 2014-10-05 NOTE — Progress Notes (Signed)
Clinical Social Work Department BRIEF PSYCHOSOCIAL ASSESSMENT 10/05/2014  Patient:  NYILAH, KIGHT     Account Number:  [id number]     Admit date:  10/04/2014  Clinical Social Worker:  Lacie Scotts  Date/Time:  10/05/2014 02:34 PM  Referred by:  Physician  Date Referred:  10/05/2014 Referred for  SNF Placement   Other Referral:   Interview type:  Family Other interview type:    PSYCHOSOCIAL DATA Living Status:  FAMILY Admitted from facility:   Level of care:   Primary support name:  Scherrie Bateman Primary support relationship to patient:  CHILD, ADULT Degree of support available:   supportive    CURRENT CONCERNS Current Concerns  Post-Acute Placement   Other Concerns:    SOCIAL WORK ASSESSMENT / PLAN Pt is a 80 yr old female living at home prior to hospitalization. Pt reports she fell at home. Pt has a fx of the right humerus . CSW met with pt / daughter to assist with d/c planning. Therapy recommendations reviewed. SNF placement is being recommended. Pt / family are in agreement with this plan. SNF search has been initiated and bed offers are pending. CSW will continue to follow to assist with d/c planning to SNF.   Assessment/plan status:  Psychosocial Support/Ongoing Assessment of Needs Other assessment/ plan:   Information/referral to community resources:   Insurance coverage for SNF and ambulance transport reviewed.    PATIENT'S/FAMILY'S RESPONSE TO PLAN OF CARE: Pt was alert and oriented during am visit but has had some confusion possibly related to UTI. Pt  reported she was comfortable provided she didn't move too much. Pt reported that she wishes she was home but appreciates being at the hospital since she is receiving the care she needs.During afternoon visit pt was sleeping. Pt's daughter agrees with plan for ST Rehab. Daughter is motivated to have her mother return home following rehab.    Werner Lean LCSW [phone]

## 2014-10-05 NOTE — Plan of Care (Signed)
Problem: Consults Goal: General Medical Patient Education See Patient Education Module for specific education.  Outcome: Completed/Met Date Met:  10/05/14 Goal: Skin Care Protocol Initiated - if Braden Score 18 or less If consults are not indicated, leave blank or document N/A  Outcome: Not Applicable Date Met:  60/73/71 Goal: Nutrition Consult-if indicated Outcome: Not Applicable Date Met:  04/21/93 Goal: Diabetes Guidelines if Diabetic/Glucose > 140 If diabetic or lab glucose is > 140 mg/dl - Initiate Diabetes/Hyperglycemia Guidelines & Document Interventions  Outcome: Not Applicable Date Met:  85/46/27  Problem: Phase I Progression Outcomes Goal: Pain controlled with appropriate interventions Outcome: Completed/Met Date Met:  10/05/14 Goal: OOB as tolerated unless otherwise ordered Outcome: Completed/Met Date Met:  10/05/14 Goal: Other Phase I Outcomes/Goals Outcome: Not Applicable Date Met:  03/50/09  Problem: Phase II Progression Outcomes Goal: Progress activity as tolerated unless otherwise ordered Outcome: Completed/Met Date Met:  10/05/14

## 2014-10-05 NOTE — Evaluation (Signed)
Physical Therapy Evaluation Patient Details Name: Brenda GarretHelen C Gonder MRN: [id number] DOB: January 12, 1921 Today's Date: 10/05/2014   History of Present Illness  This 80 year old female was admitted following a fall resulting in R humeral neck fx which is being managed conservatively with sling and NWB.  She has a h/o HTN, complete heart block, stroke and A Fib.    Clinical Impression  Pt s/p fall with R humeral fx presents with non-use of R UE, hx of peripheral neuropathy bil LEs, visual deficits adn preexisting balance deficits limiting functional mobility.  Pt would benefit from follow up rehab at SNF level prior to return home with dtr.    Follow Up Recommendations SNF    Equipment Recommendations  None recommended by PT    Recommendations for Other Services OT consult     Precautions / Restrictions Precautions Precautions: Fall;Shoulder Type of Shoulder Precautions: sling Shoulder Interventions: Shoulder sling/immobilizer Restrictions Weight Bearing Restrictions: Yes RUE Weight Bearing: Non weight bearing      Mobility  Bed Mobility Overal bed mobility: Needs Assistance;+2 for physical assistance Bed Mobility: Supine to Sit     Supine to sit: Mod assist;+2 for physical assistance     General bed mobility comments: utlized bedrail and HOB raised  Transfers Overall transfer level: Needs assistance Equipment used: None Transfers: Sit to/from Stand Sit to Stand: Max assist;+2 safety/equipment Stand pivot transfers: Max assist;+2 safety/equipment       General transfer comment: pt has posterior lean and had difficulty lifting RLE in standing.  She held onto therapist's arm and attempted to push through RUE--therapist moved arm to avoid this.  Pt did step over to chair with assistance  Ambulation/Gait             General Gait Details: Stand pvt bed to chair only  Stairs            Wheelchair Mobility    Modified Rankin (Stroke Patients Only)        Balance Overall balance assessment: Needs assistance Sitting-balance support: Feet supported Sitting balance-Leahy Scale: Poor (Right posterior lean)   Postural control: Posterior lean;Right lateral lean Standing balance support: Single extremity supported Standing balance-Leahy Scale: Zero                               Pertinent Vitals/Pain Pain Assessment: No/denies pain    Home Living Family/patient expects to be discharged to:: Skilled nursing facility Living Arrangements: Children                    Prior Function Level of Independence: Needs assistance   Gait / Transfers Assistance Needed: unsteady with walker     Comments: daughter helped pt extensively with ADLs     Hand Dominance   Dominant Hand: Right    Extremity/Trunk Assessment   Upper Extremity Assessment: Defer to OT evaluation RUE Deficits / Details: immobilized in sling; able to move fingers         Lower Extremity Assessment: RLE deficits/detail;LLE deficits/detail RLE Deficits / Details: Strength /ROM [facility] LLE Deficits / Details: Strength/ROM WFL  Cervical / Trunk Assessment: Kyphotic  Communication   Communication: HOH  Cognition Arousal/Alertness: Awake/alert Behavior During Therapy: WFL for tasks assessed/performed Overall Cognitive Status: Impaired/Different from baseline Area of Impairment: Memory               General Comments: needs reinforcement with NWB    General Comments  Exercises        Assessment/Plan    PT Assessment Patient needs continued PT services  PT Diagnosis Difficulty walking   PT Problem List Decreased strength;Decreased activity tolerance;Decreased balance;Decreased mobility;Decreased knowledge of use of DME;Pain  PT Treatment Interventions DME instruction;Gait training;Functional mobility training;Therapeutic activities;Therapeutic exercise;Balance training;Patient/family education   PT Goals (Current goals can be found  in the Care Plan section) Acute Rehab PT Goals Patient Stated Goal: daughter would like pt to get back to baseline PT Goal Formulation: With patient/family Time For Goal Achievement: 10/12/14 Potential to Achieve Goals: Fair    Frequency Min 3X/week   Barriers to discharge        Co-evaluation PT/OT/SLP Co-Evaluation/Treatment: Yes Reason for Co-Treatment: For patient/therapist safety PT goals addressed during session: Mobility/safety with mobility OT goals addressed during session: ADL's and self-care       End of Session Equipment Utilized During Treatment: Gait belt Activity Tolerance: Patient limited by fatigue Patient left: in chair;with call bell/phone within reach;with family/visitor present Nurse Communication: Mobility status         Time: 4098-11911148-1221 PT Time Calculation (min) (ACUTE ONLY): 33 min   Charges:   PT Evaluation $Initial PT Evaluation Tier I: 1 Procedure PT Treatments $Therapeutic Activity: 8-22 mins   PT G Codes:          Maresha Anastos 10/05/2014, 3:26 PM

## 2014-10-05 NOTE — Progress Notes (Signed)
Clinical Social Work Department CLINICAL SOCIAL WORK PLACEMENT NOTE 10/05/2014  Patient:  Brenda Cook,Brenda Cook  Account Number:  [id number] Admit date:  10/04/2014  Clinical Social Worker:  Cori RazorJAMIE Lior Cartelli, LCSW  Date/time:  10/05/2014 02:50 PM  Clinical Social Work is seeking post-discharge placement for this patient at the following level of care:   SKILLED NURSING   (*CSW will update this form in Epic as items are completed)   10/05/2014  Patient/family provided with [facility] of Clinical Social Work's list of facilities offering this level of care within the geographic area requested by the patient (or if unable, by the patient's family).  10/05/2014  Patient/family informed of their freedom to choose among providers that offer the needed level of care, that participate in Medicare, Medicaid or managed care program needed by the patient, have an available bed and are willing to accept the patient.    Patient/family informed of [facility]' ownership interest in [facility], as well as of the fact that they are under no obligation to receive care at this facility.  PASARR submitted to EDS on 10/05/2014 PASARR number received on 10/05/2014  FL2 transmitted to all facilities in geographic area requested by pt/family on  10/05/2014 FL2 transmitted to all facilities within larger geographic area on   Patient informed that his/her managed care company has contracts with or will negotiate with  certain facilities, including the following:     Patient/family informed of bed offers received:   Patient chooses bed at  Physician recommends and patient chooses bed at    Patient to be transferred to  on   Patient to be transferred to facility by  Patient and family notified of transfer on  Name of family member notified:    The following physician request were entered in Epic:   Additional Comments:  Cori RazorJamie Brailey Buescher LCSW [phone]

## 2014-10-05 NOTE — Plan of Care (Signed)
Problem: Phase II Progression Outcomes Goal: Vital signs remain stable Outcome: Completed/Met Date Met:  10/05/14 Goal: IV changed to normal saline lock Outcome: Completed/Met Date Met:  10/05/14 Goal: Obtain order to discontinue catheter if appropriate Outcome: Not Applicable Date Met:  00/45/99 Goal: Other Phase II Outcomes/Goals Outcome: Not Applicable Date Met:  77/41/42

## 2014-10-06 LAB — BASIC METABOLIC PANEL
ANION GAP: 11 (ref 5–15)
BUN: 22 mg/dL (ref 6–23)
CALCIUM: 9.2 mg/dL (ref 8.4–10.5)
CO2: 24 meq/L (ref 19–32)
CREATININE: 0.86 mg/dL (ref 0.50–1.10)
Chloride: 99 mEq/L (ref 96–112)
GFR calc Af Amer: 66 mL/min — ABNORMAL LOW (ref >90)
GFR, EST NON AFRICAN AMERICAN: 57 mL/min — AB (ref >90)
Glucose, Bld: 104 mg/dL — ABNORMAL HIGH (ref 70–99)
Potassium: 3.8 mEq/L (ref 3.7–5.3)
Sodium: 134 mEq/L — ABNORMAL LOW (ref 137–147)

## 2014-10-06 LAB — CBC
HEMATOCRIT: 26.9 % — AB (ref 36.0–46.0)
Hemoglobin: 9 g/dL — ABNORMAL LOW (ref 12.0–15.0)
MCH: 27.7 pg (ref 26.0–34.0)
MCHC: 33.5 g/dL (ref 30.0–36.0)
MCV: 82.8 fL (ref 78.0–100.0)
Platelets: 147 10*3/uL — ABNORMAL LOW (ref 150–400)
RBC: 3.25 MIL/uL — ABNORMAL LOW (ref 3.87–5.11)
RDW: 13.8 % (ref 11.5–15.5)
WBC: 5 10*3/uL (ref 4.0–10.5)

## 2014-10-06 MED ORDER — HYDROCODONE-ACETAMINOPHEN 5-325 MG PO TABS: 1.0000 | ORAL_TABLET | Freq: Three times a day (TID) | ORAL | Status: DC | PRN

## 2014-10-06 NOTE — Progress Notes (Signed)
CSW assisting with d/c planning. [facility] ( [facility] ) will accept pt on SAT if stable for D/C. CSW will assist with d/c planning to SNF. [facility]  has requested D/C Summary with med list be provided prior to 12 noon in order for meds to be filled . Pt / family have been updated by nsg.  Cori RazorJamie Skyelyn Scruggs LCSW [phone]

## 2014-10-06 NOTE — Progress Notes (Signed)
Patient ID: Brenda Cook  female  ZOX:[id number]    DOB: 02-10-1921    DOA: 10/04/2014  PCP: Thora LanceEHINGER,ROBERT R, MD  Brief narrative  Patient is a 80 year old female with hypertension, complete heart block, stroke, atrial fibrillation presented with mechanical fall as she was going back to her bed. Patient lost her balance and fell onto her right side, witnessed by her daughter. No syncope. X-ray of the right shoulder revealed acute right humerus neck fracture, potassium 3.0, sodium 134, creatinine 0.67 UA was positive for UTI.  Assessment/Plan: Principal Problem:   Fracture of right humerus - EDP spoke with orthopedics, Dr. Allie Bossierhris Blackman who did not feel patient needed any surgical intervention - Orthopedics recommended NWB on the right arm and follow up in office in 1 week - PT, OT evaluation recommended skilled nursing facility, continue pain control  Active Problems:   Essential hypertension - Currently stable    SICK SINUS/ TACHY-BRADY SYNDROME/   PACEMAKER - Patient has a functional left PPM, R PPM has known fractured lead of which family is aware.    Hyponatremia, hypokalemia -Likely due to HCTZ - Hold HCTZ, placed on gentle hydration  Proteus mirabilis UTI - Continue IV Rocephin, sensitivity still pending  Depression Continue Zoloft  DVT Prophylaxis:Lovenox  Code Status:Full code  Family Communication:  Disposition:  Consultants: Orthopedics   Procedures: None  Antibiotics:  IV Rocephin  Subjective: Patient seen, denies any specific complaints, pain controlled, afebrile, no dysuria or hematuria  Objective: Weight change:   Intake/Output Summary (Last 24 hours) at 10/06/14 1429 Last data filed at 10/06/14 1137  Gross per 24 hour  Intake   1180 ml  Output    325 ml  Net    855 ml   Blood pressure 137/77, pulse 71, temperature 97.8 F (36.6 C), temperature source Oral, resp. rate 20, height 5\' 6"  (1.676 m), weight 64.411 kg (142 lb), SpO2 98  %.  Physical Exam: General: Alert and awake, oriented x2, NAD CVS: S1-S2 clear Chest: CTAB, no wheezing, rales or rhonchi Abdomen: soft nontender, nondistended, normal bowel sounds  Extremities: no cyanosis, clubbing or edema noted bilaterally, right arm in sling Neuro: Cranial nerves II-XII intact, no focal neurological deficits  Lab Results: Basic Metabolic Panel:  Recent Labs Lab 10/05/14 0505 10/06/14 0500  NA 134* 134*  K 3.2* 3.8  CL 97 99  CO2 24 24  GLUCOSE 126* 104*  BUN 22 22  CREATININE 0.71 0.86  CALCIUM 9.7 9.2  MG 2.1  --    Liver Function Tests: No results for input(s): AST, ALT, ALKPHOS, BILITOT, PROT, ALBUMIN in the last 168 hours. No results for input(s): LIPASE, AMYLASE in the last 168 hours. No results for input(s): AMMONIA in the last 168 hours. CBC:  Recent Labs Lab 10/04/14 1020 10/05/14 0505 10/06/14 0500  WBC 9.1 7.4 5.0  NEUTROABS 7.7  --   --   HGB 10.7* 10.3* 9.0*  HCT 32.4* 31.9* 26.9*  MCV 82.2 83.5 82.8  PLT 202 167 147*   Cardiac Enzymes: No results for input(s): CKTOTAL, CKMB, CKMBINDEX, TROPONINI in the last 168 hours. BNP: Invalid input(s): POCBNP CBG: No results for input(s): GLUCAP in the last 168 hours.   Micro Results: Recent Results (from the past 240 hour(s))  Urine culture     Status: None (Preliminary result)   Collection Time: 10/04/14  8:56 AM  Result Value Ref Range Status   Specimen Description URINE, RANDOM  Final   Special Requests NONE  Final   Culture  Setup Time   Final    10/04/2014 14:22 Performed at [facility]    Colony Count   Final    >=100,000 COLONIES/ML Performed at [facility]    Culture   Final    PROTEUS MIRABILIS Performed at [facility]    Report Status PENDING  Incomplete    Studies/Results: Dg Chest 2 View  10/04/2014   CLINICAL DATA:  Fall.  EXAM: CHEST  2 VIEW  COMPARISON:  January 07, 2013.  FINDINGS: Stable cardiomediastinal silhouette.  Bilateral pacemakers are again noted and unchanged. No pneumothorax or pleural effusion is noted. Moderately displaced fracture is seen involving the surgical neck of the proximal right humerus. Stable hiatal hernia is noted no acute pulmonary disease is noted.  IMPRESSION: Moderately displaced fracture involving surgical neck of the proximal right humerus. Stable hiatal hernia. No acute cardiopulmonary abnormality seen.   Electronically Signed   By: Roque LiasJames  Green M.D.   On: 10/04/2014 09:49   Dg Shoulder Right  10/04/2014   CLINICAL DATA:  Fall.  Right shoulder pain.  EXAM: RIGHT SHOULDER - 2+ VIEW  COMPARISON:  None.  FINDINGS: Acute right humeral neck fracture with angulation and impaction. Anatomic alignment at the glenohumeral joint. Osteopenia. One of the pacemaker wires is fragmented at multiple locations extending from the right chest generator pack, extending into the right subclavian vein. There is extensive opacity in the right mid and lower lung zone.  IMPRESSION: Acute right humeral neck fracture.  Fragmented right chest pacemaker lead.  Extensive right lung the opacity.  Chest radiograph is rectum   Electronically Signed   By: Maryclare BeanArt  Hoss M.D.   On: 10/04/2014 08:12   Dg Elbow Complete Right  10/04/2014   CLINICAL DATA:  80 year old female with right shoulder pain after fall. Initial encounter  EXAM: RIGHT ELBOW - COMPLETE 3+ VIEW  COMPARISON:  Concurrently obtained radiographs of the right shoulder  FINDINGS: The bones appear osteopenic. The anterior fat pad is visualized but not particularly elevated. No definite elbow joint effusion. Chondrocalcinosis is present in the region of the radial capitellar joint. No focal soft tissue swelling. No acute fracture or malalignment.  IMPRESSION: 1. No acute fracture or malalignment. 2. Chondrocalcinosis at the radio capitellar joint.   Electronically Signed   By: Malachy MoanHeath  McCullough M.D.   On: 10/04/2014 08:12    Medications: Scheduled Meds: .  acetaminophen  1,000 mg Oral TID  . aspirin EC  81 mg Oral Q breakfast  . B-complex with vitamin C  1 tablet Oral QHS  . cefTRIAXone (ROCEPHIN)  IV  1 g Intravenous Q24H  . cholecalciferol  5,000 Units Oral Q breakfast  . enoxaparin (LOVENOX) injection  40 mg Subcutaneous Q24H  . metoprolol succinate  12.5 mg Oral Daily  . multivitamin with minerals  1 tablet Oral QHS  . pantoprazole  40 mg Oral Q breakfast  . polyvinyl alcohol  1 drop Both Eyes BID  . potassium chloride  20 mEq Oral Once  . potassium chloride SA  40 mEq Oral Q breakfast  . sertraline  100 mg Oral Q breakfast      LOS: 2 days   RAI,RIPUDEEP M.D. [facility] 10/06/2014, 2:29 PM Pager: [phone]  If 7PM-7AM, please contact night-coverage [email or web address] Password [email or web address]

## 2014-10-06 NOTE — Progress Notes (Signed)
Physical Therapy Treatment Patient Details Name: Brenda Cook MRN: [id number] DOB: Aug 21, 1921 Today's Date: 10/06/2014    History of Present Illness This 80 year old female was admitted following a fall resulting in R humeral neck fx which is being managed conservatively with sling and NWB.  She has a h/o HTN, complete heart block, stroke and A Fib.      PT Comments    Pt continues very pleasant and cooperative but ltd this date by increased pain/fatigue.  Follow Up Recommendations  SNF     Equipment Recommendations  None recommended by PT    Recommendations for Other Services OT consult     Precautions / Restrictions Precautions Precautions: Fall;Shoulder Type of Shoulder Precautions: sling Shoulder Interventions: Shoulder sling/immobilizer Restrictions Weight Bearing Restrictions: Yes RUE Weight Bearing: Non weight bearing    Mobility  Bed Mobility Overal bed mobility: Needs Assistance;+2 for physical assistance Bed Mobility: Supine to Sit;Sit to Supine     Supine to sit: Mod assist;+2 for physical assistance Sit to supine: Max assist;+2 for physical assistance   General bed mobility comments: assist with Bil LEs and to control trunk.  [facility] - returned to bed and assisted to roll for bed pan placement  Transfers Overall transfer level: Needs assistance Equipment used: Hemi-walker Transfers: Sit to/from Stand Sit to Stand: Max assist;+2 safety/equipment         General transfer comment: Pt stood x 2 with assist of 2 and hemi-walker.  Pt unable to achieve and maintain erect standing - c/o fatigue/pain  Ambulation/Gait             General Gait Details: Nt   Stairs            Wheelchair Mobility    Modified Rankin (Stroke Patients Only)       Balance Overall balance assessment: Needs assistance Sitting-balance support: Feet supported Sitting balance-Leahy Scale: Poor   Postural control: Posterior  lean Standing balance support: Single extremity supported Standing balance-Leahy Scale: Zero                      Cognition Arousal/Alertness: Awake/alert Behavior During Therapy: WFL for tasks assessed/performed Overall Cognitive Status: Impaired/Different from baseline Area of Impairment: Memory               General Comments: needs reinforcement with NWB    Exercises      General Comments        Pertinent Vitals/Pain Pain Assessment: Faces Pain Score: 6  Faces Pain Scale: Hurts even more Pain Location: R UE Pain Descriptors / Indicators: Aching Pain Intervention(s): Limited activity within patient's tolerance;Monitored during session;Premedicated before session;Patient requesting pain meds-RN notified;RN gave pain meds during session    Home Living                      Prior Function            PT Goals (current goals can now be found in the care plan section) Acute Rehab PT Goals Patient Stated Goal: daughter would like pt to get back to baseline PT Goal Formulation: With patient/family Time For Goal Achievement: 10/12/14 Potential to Achieve Goals: Fair Progress towards PT goals: Not progressing toward goals - comment (increased fatigue/pain this am)    Frequency  Min 3X/week    PT Plan Current plan remains appropriate    Co-evaluation  End of Session Equipment Utilized During Treatment: Gait belt Activity Tolerance: Patient limited by fatigue;Patient limited by pain Patient left: in bed;with call bell/phone within reach;with family/visitor present     Time: [id number] PT Time Calculation (min) (ACUTE ONLY): 25 min  Charges:  $Therapeutic Activity: 23-37 mins                    G Codes:      Hiroyuki Ozanich 10/06/2014, 1:01 PM

## 2014-10-07 DIAGNOSIS — S42301A Unspecified fracture of shaft of humerus, right arm, initial encounter for closed fracture: Secondary | ICD-10-CM

## 2014-10-07 LAB — CBC
HCT: 29.1 % — ABNORMAL LOW (ref 36.0–46.0)
Hemoglobin: 9.5 g/dL — ABNORMAL LOW (ref 12.0–15.0)
MCH: 27.2 pg (ref 26.0–34.0)
MCHC: 32.6 g/dL (ref 30.0–36.0)
MCV: 83.4 fL (ref 78.0–100.0)
PLATELETS: 160 10*3/uL (ref 150–400)
RBC: 3.49 MIL/uL — AB (ref 3.87–5.11)
RDW: 13.8 % (ref 11.5–15.5)
WBC: 5.4 10*3/uL (ref 4.0–10.5)

## 2014-10-07 LAB — BASIC METABOLIC PANEL
Anion gap: 10 (ref 5–15)
BUN: 17 mg/dL (ref 6–23)
CO2: 24 mEq/L (ref 19–32)
Calcium: 9.4 mg/dL (ref 8.4–10.5)
Chloride: 103 mEq/L (ref 96–112)
Creatinine, Ser: 0.82 mg/dL (ref 0.50–1.10)
GFR, EST AFRICAN AMERICAN: 70 mL/min — AB (ref >90)
GFR, EST NON AFRICAN AMERICAN: 61 mL/min — AB (ref >90)
Glucose, Bld: 104 mg/dL — ABNORMAL HIGH (ref 70–99)
Potassium: 4.1 mEq/L (ref 3.7–5.3)
Sodium: 137 mEq/L (ref 137–147)

## 2014-10-07 LAB — URINE CULTURE

## 2014-10-07 MED ORDER — CEPHALEXIN 500 MG PO CAPS: 500.0000 mg | ORAL_CAPSULE | Freq: Three times a day (TID) | ORAL | Status: DC

## 2014-10-07 MED ORDER — ACETAMINOPHEN 500 MG PO TABS: 1000.0000 mg | ORAL_TABLET | Freq: Three times a day (TID) | ORAL | Status: DC

## 2014-10-07 MED ORDER — TRAMADOL HCL 50 MG PO TABS: 50.0000 mg | ORAL_TABLET | Freq: Four times a day (QID) | ORAL | Status: DC | PRN

## 2014-10-07 NOTE — Clinical Social Work Placement (Signed)
Clinical Social Work Department CLINICAL SOCIAL WORK PLACEMENT NOTE 10/07/2014  Patient:  Brenda Cook,Brenda Cook  Account Number:  [id number] Admit date:  10/04/2014  Clinical Social Worker:  Cori RazorJAMIE HAIDINGER, LCSW  Date/time:  10/05/2014 02:50 PM  Clinical Social Work is seeking post-discharge placement for this patient at the following level of care:   SKILLED NURSING   (*CSW will update this form in Epic as items are completed)   10/05/2014  Patient/family provided with [facility] Department of Clinical Social Work's list of facilities offering this level of care within the geographic area requested by the patient (or if unable, by the patient's family).  10/05/2014  Patient/family informed of their freedom to choose among providers that offer the needed level of care, that participate in Medicare, Medicaid or managed care program needed by the patient, have an available bed and are willing to accept the patient.    Patient/family informed of [facility]' ownership interest in [facility], as well as of the fact that they are under no obligation to receive care at this facility.  PASARR submitted to EDS on 10/05/2014 PASARR number received on 10/05/2014  FL2 transmitted to all facilities in geographic area requested by pt/family on  10/05/2014 FL2 transmitted to all facilities within larger geographic area on   Patient informed that his/her managed care company has contracts with or will negotiate with  certain facilities, including the following:     Patient/family informed of bed offers received:  10/06/2014 [facility] [place] Physician recommends and patient chooses bed at    [facility] [place] on  10/07/2014 Patient to be transferred to facility by ambulance Patient and family notified of transfer on 10/07/2014 Name of family member notified:  [name],daughter  The following  physician request were entered in Epic:   Additional Comments:  .Elray Bubaegina Adama Ivins, LCSW [facility] Clinical Social Worker - Weekend Coverage cell #: [phone]

## 2014-10-07 NOTE — Clinical Social Work Note (Signed)
  CSW faxed discharge summary to [facility]   CS[name]  CSW prepared discharge packet  CSW called for transport  CSW let daughter shirley know that pt will be discharged to [facility]  No further CSW needs  CSW signing off  .[name], LCSW [facility] Clinical Social Worker - Weekend Coverage cell #: [phone]

## 2014-10-07 NOTE — Progress Notes (Signed)
[facility]  [facility]  Patient was escorted from floor via stretcher via ambulance service.  Dorothyann PengKim Jeris Roser RN

## 2014-10-07 NOTE — Progress Notes (Signed)
Medicare Important Message given? YES  (If response is "NO", the following Medicare IM given date fields will be blank)  Date Medicare IM given:  10/07/2014  Medicare IM given by: Daleen BoSHAVIS, Brandell Maready Gaila Engebretsen RN CCM Case Mgmt phone [phone]

## 2014-10-07 NOTE — Discharge Summary (Signed)
PATIENT DETAILS Name: Brenda Cook Age: 80 y.o. Sex: female Date of Birth: October 20, 1921 MRN: [id number]. Admitting Physician: Catarina Hartshorn, MD WUJ:WJXBJYN,WGNFAO R, MD  Admit Date: 10/04/2014 Discharge date: 10/07/2014  Recommendations for Outpatient Follow-up:  1. Complete 3 more days of antibiotics from 10/07/14 2. Please ensure follow-up with orthopedics-Dr. Magnus Ivan in 5-7 days. She will need a x-ray of the right shoulder to be done in 5-7 days as well 3. Please check chemistries in 1 week. 4. Please note-patient on scheduled Tylenol 1000 mg 3 times a day. Would limit further Tylenol use, as clinical improvement continues, please change Tylenol to as needed.  PRIMARY DISCHARGE DIAGNOSIS:  Principal Problem:   Fracture of right humerus Active Problems:   Essential hypertension   SICK SINUS/ TACHY-BRADY SYNDROME   PACEMAKER   Hyponatremia   Hypokalemia      PAST MEDICAL HISTORY: Past Medical History  Diagnosis Date  . Hypertension   . Hypertensive cardiovascular disease   . Peripheral edema   . Chronic cough   . GERD (gastroesophageal reflux disease)   . Arthritis   . Atrial fibrillation   . Complete heart block     Has old Thera pacemaker on right side that is turned off and new MDT Versa unit on left  . Stroke     DISCHARGE MEDICATIONS: Current Discharge Medication List    START taking these medications   Details  acetaminophen (TYLENOL) 500 MG tablet Take 2 tablets (1,000 mg total) by mouth 3 (three) times daily. Qty: 30 tablet, Refills: 0    cephALEXin (KEFLEX) 500 MG capsule Take 1 capsule (500 mg total) by mouth 3 (three) times daily. For 3 more days from 10/07/14    traMADol (ULTRAM) 50 MG tablet Take 1 tablet (50 mg total) by mouth every 6 (six) hours as needed. Qty: 20 tablet, Refills: 0      CONTINUE these medications which have NOT CHANGED   Details  aspirin EC 81 MG tablet Take 81 mg by mouth daily with breakfast.     b complex vitamins  tablet Take 1 tablet by mouth at bedtime.     Bioflavonoid Products (ESTER-C) 500-200-60 MG TABS Take 1 tablet by mouth at bedtime.     Calcium Carb-Cholecalciferol (CALCIUM 600+D) 600-800 MG-UNIT TABS Take 1 tablet by mouth 2 (two) times daily.    Cholecalciferol (VITAMIN D-3) 5000 UNITS TABS Take 1 tablet by mouth daily with breakfast.     Flaxseed, Linseed, (FLAXSEED OIL PO) Take 2,400 mg by mouth 2 (two) times daily.    metoprolol succinate (TOPROL-XL) 25 MG 24 hr tablet TAKE ONE-HALF (1/2) TABLET DAILY Qty: 45 tablet, Refills: 0    Multiple Vitamin (MULTIVITAMIN WITH MINERALS) TABS Take 1 tablet by mouth at bedtime.     Multiple Vitamins-Minerals (ICAPS) CAPS Take 1 capsule by mouth at bedtime.    Omega-3 Fatty Acids (FISH OIL PO) Take 1,290 mg by mouth 2 (two) times daily.    pantoprazole (PROTONIX) 40 MG tablet Take 40 mg by mouth daily with breakfast.     Polyethyl Glycol-Propyl Glycol (SYSTANE OP) Place 1 drop into both eyes 2 (two) times daily.     sertraline (ZOLOFT) 100 MG tablet Take 100 mg by mouth daily with breakfast.       STOP taking these medications     hydrochlorothiazide (HYDRODIURIL) 25 MG tablet      potassium chloride SA (K-DUR,KLOR-CON) 20 MEQ tablet      KLOR-CON M20 20 MEQ tablet  ALLERGIES:   Allergies  Allergen Reactions  . Benicar [Olmesartan Medoxomil] Other (See Comments)    Not effective    BRIEF HPI:  See H&P, Labs, Consult and Test reports for all details in brief, patient is a 80 year old female with a history of hypertension, complete heart block, stroke, and atrial fibrillation presents with a mechanical fall as she was going back to her bed. X-ray of the right shoulder revealed an acute right humerus neck fracture.   CONSULTATIONS:   None  PERTINENT RADIOLOGIC STUDIES: Dg Chest 2 View  10/04/2014   CLINICAL DATA:  Fall.  EXAM: CHEST  2 VIEW  COMPARISON:  January 07, 2013.  FINDINGS: Stable cardiomediastinal silhouette.  Bilateral pacemakers are again noted and unchanged. No pneumothorax or pleural effusion is noted. Moderately displaced fracture is seen involving the surgical neck of the proximal right humerus. Stable hiatal hernia is noted no acute pulmonary disease is noted.  IMPRESSION: Moderately displaced fracture involving surgical neck of the proximal right humerus. Stable hiatal hernia. No acute cardiopulmonary abnormality seen.   Electronically Signed   By: Roque LiasJames  Green M.D.   On: 10/04/2014 09:49   Dg Shoulder Right  10/04/2014   CLINICAL DATA:  Fall.  Right shoulder pain.  EXAM: RIGHT SHOULDER - 2+ VIEW  COMPARISON:  None.  FINDINGS: Acute right humeral neck fracture with angulation and impaction. Anatomic alignment at the glenohumeral joint. Osteopenia. One of the pacemaker wires is fragmented at multiple locations extending from the right chest generator pack, extending into the right subclavian vein. There is extensive opacity in the right mid and lower lung zone.  IMPRESSION: Acute right humeral neck fracture.  Fragmented right chest pacemaker lead.  Extensive right lung the opacity.  Chest radiograph is rectum   Electronically Signed   By: Maryclare BeanArt  Hoss M.D.   On: 10/04/2014 08:12   Dg Elbow Complete Right  10/04/2014   CLINICAL DATA:  80 year old female with right shoulder pain after fall. Initial encounter  EXAM: RIGHT ELBOW - COMPLETE 3+ VIEW  COMPARISON:  Concurrently obtained radiographs of the right shoulder  FINDINGS: The bones appear osteopenic. The anterior fat pad is visualized but not particularly elevated. No definite elbow joint effusion. Chondrocalcinosis is present in the region of the radial capitellar joint. No focal soft tissue swelling. No acute fracture or malalignment.  IMPRESSION: 1. No acute fracture or malalignment. 2. Chondrocalcinosis at the radio capitellar joint.   Electronically Signed   By: Malachy MoanHeath  McCullough M.D.   On: 10/04/2014 08:12     PERTINENT LAB RESULTS: CBC:  Recent  Labs  10/06/14 0500 10/07/14 0546  WBC 5.0 5.4  HGB 9.0* 9.5*  HCT 26.9* 29.1*  PLT 147* 160   CMET CMP     Component Value Date/Time   NA 137 10/07/2014 0546   K 4.1 10/07/2014 0546   CL 103 10/07/2014 0546   CO2 24 10/07/2014 0546   GLUCOSE 104* 10/07/2014 0546   BUN 17 10/07/2014 0546   CREATININE 0.82 10/07/2014 0546   CREATININE 1.11* 01/16/2012 1551   CALCIUM 9.4 10/07/2014 0546   PROT 7.4 01/07/2013 1826   ALBUMIN 3.4* 01/07/2013 1826   AST 22 01/07/2013 1826   ALT 15 01/07/2013 1826   ALKPHOS 106 01/07/2013 1826   BILITOT 0.6 01/07/2013 1826   GFRNONAA 61* 10/07/2014 0546   GFRAA 70* 10/07/2014 0546    GFR Estimated Creatinine Clearance: 41.8 mL/min (by C-G formula based on Cr of 0.82). No results for input(s): LIPASE, AMYLASE  in the last 72 hours. No results for input(s): CKTOTAL, CKMB, CKMBINDEX, TROPONINI in the last 72 hours. Invalid input(s): POCBNP No results for input(s): DDIMER in the last 72 hours. No results for input(s): HGBA1C in the last 72 hours. No results for input(s): CHOL, HDL, LDLCALC, TRIG, CHOLHDL, LDLDIRECT in the last 72 hours. No results for input(s): TSH, T4TOTAL, T3FREE, THYROIDAB in the last 72 hours.  Invalid input(s): FREET3 No results for input(s): VITAMINB12, FOLATE, FERRITIN, TIBC, IRON, RETICCTPCT in the last 72 hours. Coags: No results for input(s): INR in the last 72 hours.  Invalid input(s): PT Microbiology: Recent Results (from the past 240 hour(s))  Urine culture     Status: None   Collection Time: 10/04/14  8:56 AM  Result Value Ref Range Status   Specimen Description URINE, RANDOM  Final   Special Requests NONE  Final   Culture  Setup Time   Final    10/04/2014 14:22 Performed at [facility] Count   Final    >=100,000 COLONIES/ML Performed at [facility]    Culture   Final    PROTEUS MIRABILIS Performed at [facility]    Report Status 10/07/2014 FINAL  Final    Organism ID, Bacteria PROTEUS MIRABILIS  Final      Susceptibility   Proteus mirabilis - MIC*    AMPICILLIN <=2 SENSITIVE Sensitive     CEFAZOLIN <=4 SENSITIVE Sensitive     CEFTRIAXONE <=1 SENSITIVE Sensitive     CIPROFLOXACIN <=0.25 SENSITIVE Sensitive     GENTAMICIN <=1 SENSITIVE Sensitive     LEVOFLOXACIN <=0.12 SENSITIVE Sensitive     NITROFURANTOIN 128 RESISTANT Resistant     TOBRAMYCIN <=1 SENSITIVE Sensitive     TRIMETH/SULFA <=20 SENSITIVE Sensitive     PIP/TAZO <=4 SENSITIVE Sensitive     * PROTEUS MIRABILIS     BRIEF HOSPITAL COURSE:   Principal Problem:   Fracture of right humerus: Patient admitted with a mechanical fall, x-ray of the right shoulder demonstrated a humeral neck fracture. On admission, ED MD spoke with Dr. Magnus Ivan, who reviewed agents x-ray and did not feel that the patient need any  intervention. Recommendations were nonweightbearing to the right arm, and to follow-up in the office in one week. Patient was subsequently admitted, seen by physical therapy and current recommendations are to discharge to skilled nursing facility. Pain appears adequately controlled with scheduled Tylenol.  Active Problems:   UTI: Urine cultures positive for Proteus that is pansensitive. She was maintained on IV Rocephin, she will be transitioned to Keflex for 3 more days to complete a seven-day course.    Hypertension: Controlled with metoprolol. HCTZ discontinued in this 80 year old because of dehydration and hypokalemia.    Hyponatremia/hypokalemia: Secondary to HCTZ, resolved with gentle hydration.    SICK SINUS/ TACHY-BRADY SYNDROME-s/p   PACEMAKER    TODAY-DAY OF DISCHARGE:  Subjective:   Brenda Cook today has no headache,no chest abdominal pain,no new weakness tingling or numbness, feels much better wants to go home today.   Objective:   Blood pressure 146/59, pulse 72, temperature 97.8 F (36.6 C), temperature source Oral, resp. rate 16, height 5\' 6"  (1.676 m),  weight 64.411 kg (142 lb), SpO2 97 %.  Intake/Output Summary (Last 24 hours) at 10/07/14 1028 Last data filed at 10/06/14 1300  Gross per 24 hour  Intake    240 ml  Output     75 ml  Net    165 ml  Filed Weights   10/04/14 0701 10/04/14 1320  Weight: 64.411 kg (142 lb) 64.411 kg (142 lb)    Exam Awake Alert, Oriented *3, No new F.N deficits, Normal affect Liberal.AT,PERRAL Supple Neck,No JVD, No cervical lymphadenopathy appriciated.  Symmetrical Chest wall movement, Good air movement bilaterally, CTAB RRR,No Gallops,Rubs or new Murmurs, No Parasternal Heave +ve B.Sounds, Abd Soft, Non tender, No organomegaly appriciated, No rebound -guarding or rigidity. No Cyanosis, Clubbing or edema, No new Rash or bruise  DISCHARGE CONDITION: Stable  DISPOSITION: SNF  CODE STATUS: DNR-Confirmed with patient's daughter at bedside  DISCHARGE INSTRUCTIONS:    Activity:  As tolerated with Full fall precautions use walker/cane & assistance as needed  Diet recommendation: Heart Healthy diet   Discharge Instructions    Diet - low sodium heart healthy    Complete by:  As directed      Increase activity slowly    Complete by:  As directed            Follow-up Information    Follow up with Kathryne HitchBLACKMAN,CHRISTOPHER Y, MD.   Specialty:  Orthopedic Surgery   Why:  follow up in 5-7 days with right shoulder xray   Contact information:   [address] [place] [address] [phone]       Follow up with Thora LanceEHINGER,ROBERT R, MD. Schedule an appointment as soon as possible for a visit in 1 week.   Specialty:  Family Medicine   Why:  after discharge from SNF   Contact information:   [address] [place] [address] [phone]       Total Time spent on discharge equals 45 minutes.  SignedJeoffrey Massed: Carmilla Granville 10/07/2014 10:28 AM

## 2014-12-21 ENCOUNTER — Encounter: Payer: Self-pay | Admitting: *Deleted

## 2015-03-06 ENCOUNTER — Encounter: Payer: Self-pay | Admitting: Cardiology

## 2015-03-06 ENCOUNTER — Telehealth: Payer: Self-pay | Admitting: Cardiology

## 2015-03-06 ENCOUNTER — Ambulatory Visit (INDEPENDENT_AMBULATORY_CARE_PROVIDER_SITE_OTHER): Payer: Medicare Other | Admitting: Cardiology

## 2015-03-06 VITALS — BP 100/58 | HR 71 | Ht 63.0 in | Wt 128.0 lb

## 2015-03-06 DIAGNOSIS — Z950 Presence of cardiac pacemaker: Secondary | ICD-10-CM

## 2015-03-06 DIAGNOSIS — I495 Sick sinus syndrome: Secondary | ICD-10-CM

## 2015-03-06 DIAGNOSIS — I442 Atrioventricular block, complete: Secondary | ICD-10-CM | POA: Diagnosis not present

## 2015-03-06 NOTE — Telephone Encounter (Signed)
New message      Pt just saw Dr Patty SermonsBrackbill.  Question regarding zoloft.  Dr Patty SermonsBrackbill told pt to start taking medication at night. She had a pill this am.  Should she take another one tonight or wait and take it tomorrow night?

## 2015-03-06 NOTE — Progress Notes (Signed)
Cardiology Office Note   Date:  03/06/2015   ID:  Brenda GarretHelen C Postlewait, DOB [birth year], MRN [id number]  PCP:  Thora LanceEHINGER,ROBERT R, MD  Cardiologist: Cassell Clementhomas Domonic Hiscox MD  No chief complaint on file.     History of Present Illness: Brenda Cook is a 81 y.o. female who presents for a one-year follow-up office visit  This pleasant 81 year old woman is seen for a scheduled one year followup office visit. She has a past history of symptomatic bradycardia and has a functioning pacemaker in place. She has had a pacemaker since 1995. He has had a problem with essential hypertension .  Today her blood pressure is borderline low.  Her daughter will start checking it at home. The patient is now wheelchair bound.  She is unable to stand or walk.  Her daughter takes care of her. The patient has a history of complete heart block.  She has a remote history of paroxysmal atrial fibrillation.  She is not a candidate for Coumadin.  She intended is on a baby aspirin. She has a lot of problems with excessive daytime sleepiness and she does not sleep well at night.  She will change the timing of her Zoloft to bedtime and see if this will help. The patient had had a problem with weight loss.  She had been in a nursing home until March 2016 at which time she was able to move back home.  Since coming home she has been eating better according to the daughter.  Past Medical History  Diagnosis Date  . Hypertension   . Hypertensive cardiovascular disease   . Peripheral edema   . Chronic cough   . GERD (gastroesophageal reflux disease)   . Arthritis   . Atrial fibrillation   . Complete heart block     Has old Thera pacemaker on right side that is turned off and new MDT Versa unit on left  . Stroke     Past Surgical History  Procedure Laterality Date  . Partial hysterectomy    . Appendectomy    . Tonsillectomy and adenoidectomy    . Insert / replace / remove pacemaker  7/10  . Insert / replace / remove  pacemaker  5/79  . Insert / replace / remove pacemaker  7/95  . Shoulder surgery    . Tendon repair  07/20/2012    Procedure: TENDON REPAIR;  Surgeon: Nicki ReaperGary R Kuzma, MD;  Location: [facility];  Service: Orthopedics;  Laterality: Left;  Reconstruction Extensor hood Left middle finger and left ring finger  . Repair extensor tendon Right 12/17/2012    Procedure: REPAIR EXTENSOR TENDON;  Surgeon: Nicki ReaperGary R Kuzma, MD;  Location: [facility];  Service: Orthopedics;  Laterality: Right;  CENTRALIZATION EXTENSOR TENDON RIGHT RING FINGER/RIGHT SMALL FINGER ANESTHESIA     Current Outpatient Prescriptions  Medication Sig Dispense Refill  . acetaminophen (TYLENOL) 500 MG tablet Take 500 mg by mouth 3 (three) times daily as needed for mild pain or moderate pain.    Marland Kitchen. aspirin EC 81 MG tablet Take 81 mg by mouth daily with breakfast.     . b complex vitamins tablet Take 1 tablet by mouth at bedtime.     Marland Kitchen. Bioflavonoid Products (ESTER-C) 500-200-60 MG TABS Take 1 tablet by mouth at bedtime.     . Calcium Carb-Cholecalciferol (CALCIUM 600+D) 600-800 MG-UNIT TABS Take 1 tablet by mouth 2 (two) times daily.    . Cholecalciferol (VITAMIN D-3) 5000 UNITS TABS Take  1 tablet by mouth daily with breakfast.     . Flaxseed, Linseed, (FLAXSEED OIL PO) Take 2,400 mg by mouth 2 (two) times daily.    . metoprolol succinate (TOPROL-XL) 25 MG 24 hr tablet TAKE ONE-HALF (1/2) TABLET DAILY 45 tablet 0  . Multiple Vitamin (MULTIVITAMIN WITH MINERALS) TABS Take 1 tablet by mouth at bedtime.     . Multiple Vitamins-Minerals (ICAPS) CAPS Take 1 capsule by mouth at bedtime.    . Omega-3 Fatty Acids (FISH OIL PO) Take 1,290 mg by mouth 2 (two) times daily.    . pantoprazole (PROTONIX) 40 MG tablet Take 40 mg by mouth daily with breakfast.     . Polyethyl Glycol-Propyl Glycol (SYSTANE OP) Place 1 drop into both eyes 2 (two) times daily.     . potassium chloride SA (K-DUR,KLOR-CON) 20 MEQ tablet Take 20 mEq by  mouth daily.    . sertraline (ZOLOFT) 100 MG tablet Take 100 mg by mouth at bedtime.      No current facility-administered medications for this visit.    Allergies:   Benicar    Social History:  The patient  reports that she has never smoked. She does not have any smokeless tobacco history on file. She reports that she does not drink alcohol or use illicit drugs.   Family History:  The patient's family history includes Heart failure in her mother; Leukemia in her sister; Stroke in her father.    ROS:  Please see the history of present illness.   Otherwise, review of systems are positive for none.   All other systems are reviewed and negative.    PHYSICAL EXAM: VS:  BP 100/58 mmHg  Pulse 71  Ht 5\' 3"  (1.6 m)  Wt 128 lb (58.06 kg)  BMI 22.68 kg/m2 , BMI Body mass index is 22.68 kg/(m^2). GEN: Well nourished, well developed, in no acute distress HEENT: normal Neck: no JVD, carotid bruits, or masses Cardiac: RRR; no murmurs, rubs, or gallops,no edema  Respiratory:  clear to auscultation bilaterally, normal work of breathing GI: soft, nontender, nondistended, + BS MS: no deformity or atrophy Skin: warm and dry, no rash Neuro:  Strength and sensation are intact Psych: euthymic mood, full affect   EKG:  EKG is ordered today. The ekg ordered today demonstrates AV sequential paced rhythm   Recent Labs: 10/05/2014: Magnesium 2.1 10/07/2014: BUN 17; Creatinine 0.82; Hemoglobin 9.5*; Platelets 160; Potassium 4.1; Sodium 137    Lipid Panel No results found for: CHOL, TRIG, HDL, CHOLHDL, VLDL, LDLCALC, LDLDIRECT    Wt Readings from Last 3 Encounters:  03/06/15 128 lb (58.06 kg)  10/04/14 142 lb (64.411 kg)  06/15/14 142 lb (64.411 kg)       ASSESSMENT AND PLAN:  1.  History of complete heart block with functioning dual-chamber pacemaker. 2.  History of essential hypertension 3.  Debility, wheelchair-bound 4.  Daytime sleepiness and insomnia at night.    Current  medicines are reviewed at length with the patient today.  The patient does not have concerns regarding medicines.  The following changes have been made:  no change  Labs/ tests ordered today include:   Orders Placed This Encounter  Procedures  . EKG 12-Lead    Disposition: She will try taking Zoloft at bedtime and see if she will sleep better at night.  If her blood pressure drops below 100 systolic her daughter will put more salt on her food. Recheck here in one year for follow-up office visit and EKG  Continue medical follow-up with Dr.Ehinger.  Karie Schwalbe MD 03/06/2015 5:13 PM    [facility] [address] [place], [place], [place]  [address] Phone: [phone]; Fax: [phone]

## 2015-03-06 NOTE — Patient Instructions (Signed)
Medication Instructions:  Change taking your Zoloft to the evening  Labwork: none  Testing/Procedures: none  Follow-Up: Your physician wants you to follow-up in: 1 year ov/ekg You will receive a reminder letter in the mail two months in advance. If you don't receive a letter, please call our office to schedule the follow-up appointment.   Any Other Special Instructions Will Be Listed Below  ADD EXTRA SALT TO DIET IF TOP NUMBER OF BLOOD PRESSURE BELOW 100

## 2015-03-06 NOTE — Telephone Encounter (Signed)
Advised to take in the evening starting tomorrow and not to have anymore Zoloft until then, verbalized understanding

## 2015-03-14 ENCOUNTER — Emergency Department (HOSPITAL_COMMUNITY): Payer: Medicare Other

## 2015-03-14 ENCOUNTER — Encounter (HOSPITAL_COMMUNITY): Payer: Self-pay | Admitting: Emergency Medicine

## 2015-03-14 ENCOUNTER — Inpatient Hospital Stay (HOSPITAL_COMMUNITY)
Admission: EM | Admit: 2015-03-14 | Discharge: 2015-03-25 | DRG: 871 | Disposition: A | Discharge status: Home / Self Care | Payer: Medicare Other | Attending: Internal Medicine | Admitting: Internal Medicine

## 2015-03-14 DIAGNOSIS — N136 Pyonephrosis: Secondary | ICD-10-CM | POA: Diagnosis present

## 2015-03-14 DIAGNOSIS — Z8249 Family history of ischemic heart disease and other diseases of the circulatory system: Secondary | ICD-10-CM | POA: Diagnosis not present

## 2015-03-14 DIAGNOSIS — Z806 Family history of leukemia: Secondary | ICD-10-CM

## 2015-03-14 DIAGNOSIS — I110 Hypertensive heart disease with heart failure: Secondary | ICD-10-CM | POA: Diagnosis present

## 2015-03-14 DIAGNOSIS — A419 Sepsis, unspecified organism: Principal | ICD-10-CM | POA: Diagnosis present

## 2015-03-14 DIAGNOSIS — E869 Volume depletion, unspecified: Secondary | ICD-10-CM | POA: Diagnosis present

## 2015-03-14 DIAGNOSIS — Z823 Family history of stroke: Secondary | ICD-10-CM

## 2015-03-14 DIAGNOSIS — Z9981 Dependence on supplemental oxygen: Secondary | ICD-10-CM

## 2015-03-14 DIAGNOSIS — N132 Hydronephrosis with renal and ureteral calculous obstruction: Secondary | ICD-10-CM | POA: Diagnosis present

## 2015-03-14 DIAGNOSIS — B964 Proteus (mirabilis) (morganii) as the cause of diseases classified elsewhere: Secondary | ICD-10-CM | POA: Diagnosis present

## 2015-03-14 DIAGNOSIS — I480 Paroxysmal atrial fibrillation: Secondary | ICD-10-CM | POA: Diagnosis present

## 2015-03-14 DIAGNOSIS — I5031 Acute diastolic (congestive) heart failure: Secondary | ICD-10-CM | POA: Diagnosis not present

## 2015-03-14 DIAGNOSIS — Z950 Presence of cardiac pacemaker: Secondary | ICD-10-CM

## 2015-03-14 DIAGNOSIS — D649 Anemia, unspecified: Secondary | ICD-10-CM | POA: Diagnosis present

## 2015-03-14 DIAGNOSIS — E876 Hypokalemia: Secondary | ICD-10-CM | POA: Diagnosis present

## 2015-03-14 DIAGNOSIS — R6520 Severe sepsis without septic shock: Secondary | ICD-10-CM | POA: Diagnosis present

## 2015-03-14 DIAGNOSIS — H548 Legal blindness, as defined in USA: Secondary | ICD-10-CM | POA: Diagnosis present

## 2015-03-14 DIAGNOSIS — I10 Essential (primary) hypertension: Secondary | ICD-10-CM | POA: Diagnosis not present

## 2015-03-14 DIAGNOSIS — N201 Calculus of ureter: Secondary | ICD-10-CM | POA: Diagnosis present

## 2015-03-14 DIAGNOSIS — N390 Urinary tract infection, site not specified: Secondary | ICD-10-CM | POA: Diagnosis not present

## 2015-03-14 DIAGNOSIS — K219 Gastro-esophageal reflux disease without esophagitis: Secondary | ICD-10-CM | POA: Diagnosis present

## 2015-03-14 DIAGNOSIS — I509 Heart failure, unspecified: Secondary | ICD-10-CM

## 2015-03-14 DIAGNOSIS — H9190 Unspecified hearing loss, unspecified ear: Secondary | ICD-10-CM | POA: Diagnosis present

## 2015-03-14 DIAGNOSIS — H3530 Unspecified macular degeneration: Secondary | ICD-10-CM | POA: Diagnosis present

## 2015-03-14 DIAGNOSIS — R1032 Left lower quadrant pain: Secondary | ICD-10-CM | POA: Diagnosis present

## 2015-03-14 DIAGNOSIS — R0602 Shortness of breath: Secondary | ICD-10-CM

## 2015-03-14 DIAGNOSIS — Z8673 Personal history of transient ischemic attack (TIA), and cerebral infarction without residual deficits: Secondary | ICD-10-CM | POA: Diagnosis not present

## 2015-03-14 DIAGNOSIS — Z7982 Long term (current) use of aspirin: Secondary | ICD-10-CM

## 2015-03-14 DIAGNOSIS — N10 Acute tubulo-interstitial nephritis: Secondary | ICD-10-CM | POA: Diagnosis not present

## 2015-03-14 DIAGNOSIS — N200 Calculus of kidney: Secondary | ICD-10-CM

## 2015-03-14 LAB — URINALYSIS, ROUTINE W REFLEX MICROSCOPIC
Bilirubin Urine: NEGATIVE
Glucose, UA: NEGATIVE mg/dL
Ketones, ur: NEGATIVE mg/dL
Nitrite: NEGATIVE
PROTEIN: NEGATIVE mg/dL
Specific Gravity, Urine: 1.017 (ref 1.005–1.030)
Urobilinogen, UA: 0.2 mg/dL (ref 0.0–1.0)
pH: 7.5 (ref 5.0–8.0)

## 2015-03-14 LAB — COMPREHENSIVE METABOLIC PANEL
ALBUMIN: 4 g/dL (ref 3.5–5.0)
ALK PHOS: 108 U/L (ref 38–126)
ALT: 24 U/L (ref 14–54)
ANION GAP: 8 (ref 5–15)
AST: 31 U/L (ref 15–41)
BUN: 28 mg/dL — AB (ref 6–20)
CO2: 29 mmol/L (ref 22–32)
Calcium: 10.2 mg/dL (ref 8.9–10.3)
Chloride: 102 mmol/L (ref 101–111)
Creatinine, Ser: 0.85 mg/dL (ref 0.44–1.00)
GFR calc Af Amer: >60 mL/min (ref >60)
GFR calc non Af Amer: 58 mL/min — ABNORMAL LOW (ref >60)
Glucose, Bld: 162 mg/dL — ABNORMAL HIGH (ref 65–99)
POTASSIUM: 3.5 mmol/L (ref 3.5–5.1)
Sodium: 139 mmol/L (ref 135–145)
TOTAL PROTEIN: 7.7 g/dL (ref 6.5–8.1)
Total Bilirubin: 0.6 mg/dL (ref 0.3–1.2)

## 2015-03-14 LAB — URINE MICROSCOPIC-ADD ON

## 2015-03-14 LAB — CBC WITH DIFFERENTIAL/PLATELET
BASOS ABS: 0 10*3/uL (ref 0.0–0.1)
BASOS PCT: 0 % (ref 0–1)
Eosinophils Absolute: 0 10*3/uL (ref 0.0–0.7)
Eosinophils Relative: 0 % (ref 0–5)
HCT: 37.8 % (ref 36.0–46.0)
HEMOGLOBIN: 11.8 g/dL — AB (ref 12.0–15.0)
Lymphocytes Relative: 2 % — ABNORMAL LOW (ref 12–46)
Lymphs Abs: 0.4 10*3/uL — ABNORMAL LOW (ref 0.7–4.0)
MCH: 25.3 pg — ABNORMAL LOW (ref 26.0–34.0)
MCHC: 31.2 g/dL (ref 30.0–36.0)
MCV: 80.9 fL (ref 78.0–100.0)
Monocytes Absolute: 0.8 10*3/uL (ref 0.1–1.0)
Monocytes Relative: 5 % (ref 3–12)
Neutro Abs: 15.7 10*3/uL — ABNORMAL HIGH (ref 1.7–7.7)
Neutrophils Relative %: 93 % — ABNORMAL HIGH (ref 43–77)
PLATELETS: 232 10*3/uL (ref 150–400)
RBC: 4.67 MIL/uL (ref 3.87–5.11)
RDW: 15 % (ref 11.5–15.5)
WBC: 16.9 10*3/uL — ABNORMAL HIGH (ref 4.0–10.5)

## 2015-03-14 LAB — LIPASE, BLOOD: Lipase: 17 U/L — ABNORMAL LOW (ref 22–51)

## 2015-03-14 LAB — I-STAT CG4 LACTIC ACID, ED: Lactic Acid, Venous: 3.63 mmol/L (ref 0.5–2.0)

## 2015-03-14 MED ORDER — DEXTROSE 5 % IV SOLN
1.0000 g | Freq: Once | INTRAVENOUS | Status: AC
  Administered 2015-03-14: 1 g via INTRAVENOUS
  Filled 2015-03-14: qty 10

## 2015-03-14 MED ORDER — SODIUM CHLORIDE 0.9 % IV BOLUS (SEPSIS)
500.0000 mL | Freq: Once | INTRAVENOUS | Status: AC
Start: 2015-03-14 — End: 2015-03-14
  Administered 2015-03-14: 500 mL via INTRAVENOUS

## 2015-03-14 NOTE — Progress Notes (Signed)
Please place orders for home health services with face to face for RN, PT, OT aide and social worker if patient is discharged from ED this evening.

## 2015-03-14 NOTE — ED Notes (Signed)
Pt made aware we need urine specimen, states she is incontinent, will order In and out.

## 2015-03-14 NOTE — ED Notes (Signed)
Bed: WA09 Expected date: 03/14/15 Expected time: 7:32 PM Means of arrival: Ambulance Comments: 81 yo F lower abd pain, vomiting, diarrhea

## 2015-03-14 NOTE — ED Notes (Signed)
Patient transported to CT 

## 2015-03-14 NOTE — ED Provider Notes (Signed)
CSN: [id number]     Arrival date & time 03/14/15  1948 History   First MD Initiated Contact with Patient 03/14/15 2051     Chief Complaint  Patient presents with  . Abdominal Pain  . Emesis  . Diarrhea     Patient is a 81 y.o. female presenting with abdominal pain, vomiting, and diarrhea. The history is provided by the patient and a relative. No language interpreter was used.  Abdominal Pain Associated symptoms: diarrhea and vomiting   Emesis Associated symptoms: abdominal pain and diarrhea   Diarrhea Associated symptoms: abdominal pain and vomiting    Brenda Cook presents for evaluation of V/D/abdominal pain.  Hx is provided by the patient and her daughter.  This morning she developed LLQ AP with multiple episodes of emesis.  She has vomited all that she consumed today (approx 7+ episodes).  She has had a small volume of diarrhea.  No hematochezia, no melena.  No fevers, dysuria, no recent trauma, no chest pain, sob.  Sxs are moderate, constant, unchanging.    Past Medical History  Diagnosis Date  . Hypertension   . Hypertensive cardiovascular disease   . Peripheral edema   . Chronic cough   . GERD (gastroesophageal reflux disease)   . Arthritis   . Atrial fibrillation   . Complete heart block     Has old Thera pacemaker on right side that is turned off and new MDT Versa unit on left  . Stroke    Past Surgical History  Procedure Laterality Date  . Partial hysterectomy    . Appendectomy    . Tonsillectomy and adenoidectomy    . Insert / replace / remove pacemaker  7/10  . Insert / replace / remove pacemaker  5/79  . Insert / replace / remove pacemaker  7/95  . Shoulder surgery    . Tendon repair  07/20/2012    Procedure: TENDON REPAIR;  Surgeon: Nicki ReaperGary R Kuzma, MD;  Location: [facility];  Service: Orthopedics;  Laterality: Left;  Reconstruction Extensor hood Left middle finger and left ring finger  . Repair extensor tendon Right 12/17/2012    Procedure: REPAIR  EXTENSOR TENDON;  Surgeon: Nicki ReaperGary R Kuzma, MD;  Location: [facility];  Service: Orthopedics;  Laterality: Right;  CENTRALIZATION EXTENSOR TENDON RIGHT RING FINGER/RIGHT SMALL FINGER ANESTHESIA   Family History  Problem Relation Age of Onset  . Stroke Father   . Heart failure Mother   . Leukemia Sister    History  Substance Use Topics  . Smoking status: Never Smoker   . Smokeless tobacco: Not on file  . Alcohol Use: No   OB History    No data available     Review of Systems  Gastrointestinal: Positive for vomiting, abdominal pain and diarrhea.  All other systems reviewed and are negative.     Allergies  Depakote and Benicar  Home Medications   Prior to Admission medications   Medication Sig Start Date End Date Taking? Authorizing Provider  aspirin EC 81 MG tablet Take 81 mg by mouth daily with breakfast.    Yes Historical Provider, MD  b complex vitamins tablet Take 1 tablet by mouth at bedtime.    Yes Historical Provider, MD  Bioflavonoid Products (ESTER-C) 500-200-60 MG TABS Take 1 tablet by mouth at bedtime.    Yes Historical Provider, MD  Calcium Carb-Cholecalciferol (CALCIUM 600+D) 600-800 MG-UNIT TABS Take 1 tablet by mouth 2 (two) times daily.   Yes Historical Provider, MD  Cholecalciferol (VITAMIN D-3) 5000 UNITS TABS Take 1 tablet by mouth daily with breakfast.    Yes Historical Provider, MD  diphenhydrAMINE (SOMINEX) 25 MG tablet Take 25 mg by mouth at bedtime as needed for sleep.   Yes Historical Provider, MD  Flaxseed, Linseed, (FLAXSEED OIL PO) Take 2,400 mg by mouth 2 (two) times daily.   Yes Historical Provider, MD  hydrochlorothiazide (HYDRODIURIL) 25 MG tablet  03/09/15  Yes Historical Provider, MD  metoprolol succinate (TOPROL-XL) 25 MG 24 hr tablet TAKE ONE-HALF (1/2) TABLET DAILY 07/18/14  Yes Brenda Range, MD  Multiple Vitamin (MULTIVITAMIN WITH MINERALS) TABS Take 1 tablet by mouth at bedtime.    Yes Historical Provider, MD  Multiple  Vitamins-Minerals (ICAPS PO) Take 1 capsule by mouth daily at 2 PM daily at 2 PM.   Yes Historical Provider, MD  Omega-3 Fatty Acids (FISH OIL PO) Take 1,290 mg by mouth 2 (two) times daily.   Yes Historical Provider, MD  pantoprazole (PROTONIX) 40 MG tablet Take 40 mg by mouth daily with breakfast.    Yes Historical Provider, MD  Polyethyl Glycol-Propyl Glycol (SYSTANE OP) Place 1 drop into both eyes 2 (two) times daily.    Yes Historical Provider, MD  potassium chloride SA (K-DUR,KLOR-CON) 20 MEQ tablet Take 20 mEq by mouth daily. 01/29/15  Yes Historical Provider, MD  sertraline (ZOLOFT) 100 MG tablet Take 100 mg by mouth at bedtime.    Yes Historical Provider, MD   BP 143/66 mmHg  Pulse 82  Temp(Src) 97.7 F (36.5 C) (Oral)  Resp 16  SpO2 99% Physical Exam  Constitutional: She appears well-developed and well-nourished.  HENT:  Head: Normocephalic and atraumatic.  Cardiovascular: Normal rate and regular rhythm.   No murmur heard. Pulmonary/Chest: Effort normal and breath sounds normal. No respiratory distress.  Abdominal: Soft. There is no rebound and no guarding.  Mild LLQ tenderness  Musculoskeletal: She exhibits no edema or tenderness.  Neurological: She is alert.  Disoriented to time  Skin: Skin is warm and dry.  Psychiatric: She has a normal mood and affect. Her behavior is normal.  Nursing note and vitals reviewed.   ED Course  Procedures (including critical care time) Labs Review Labs Reviewed  COMPREHENSIVE METABOLIC PANEL - Abnormal; Notable for the following:    Glucose, Bld 162 (*)    BUN 28 (*)    GFR calc non Af Amer 58 (*)    All other components within normal limits  CBC WITH DIFFERENTIAL/PLATELET - Abnormal; Notable for the following:    WBC 16.9 (*)    Hemoglobin 11.8 (*)    MCH 25.3 (*)    Neutrophils Relative % 93 (*)    Neutro Abs 15.7 (*)    Lymphocytes Relative 2 (*)    Lymphs Abs 0.4 (*)    All other components within normal limits  URINALYSIS,  ROUTINE W REFLEX MICROSCOPIC - Abnormal; Notable for the following:    APPearance CLOUDY (*)    Hgb urine dipstick LARGE (*)    Leukocytes, UA MODERATE (*)    All other components within normal limits  LIPASE, BLOOD - Abnormal; Notable for the following:    Lipase 17 (*)    All other components within normal limits  URINE MICROSCOPIC-ADD ON - Abnormal; Notable for the following:    Bacteria, UA MANY (*)    All other components within normal limits  I-STAT CG4 LACTIC ACID, ED - Abnormal; Notable for the following:    Lactic Acid, Venous 3.63 (*)  All other components within normal limits  URINE CULTURE    Imaging Review Ct Abdomen Pelvis Wo Contrast  03/14/2015   CLINICAL DATA:  Lower abdominal pain, nausea, vomiting and diarrhea for 1 day.  EXAM: CT ABDOMEN AND PELVIS WITHOUT CONTRAST  TECHNIQUE: Multidetector CT imaging of the abdomen and pelvis was performed following the standard protocol without IV contrast.  COMPARISON:  CT 07/28/2011  FINDINGS: Evaluation of the lung bases demonstrates cardiomegaly and coronary artery calcifications. Pacemaker is partially included. There is significant tortuosity of the distal descending thoracic aorta. Chronic changes noted at the lung bases with pleural-parenchymal scarring.  There is a moderate hiatal hernia, mildly progressed in size compared to prior exam. The unenhanced liver, gallbladder, spleen, and right adrenal gland are normal. The left adrenal gland is suboptimally defined, there is soft tissue stranding in the left upper quadrant in the region of the adrenal gland. Pancreas is atrophic without surrounding inflammatory change.  There is a 4 mm obstructing stone at the left ureterovesicular junction with resultant mild hydronephrosis. Perinephric stranding noted about the left kidney. Additional punctate nonobstructing stones in the mid and lower left kidney. There is thinning of the renal parenchyma. Thinning of the right renal parenchyma  without hydronephrosis. Kidneys are both inferiorly located and slightly ectatic in appearance due to scoliosis.  The stomach is decompressed. There are no dilated or thickened bowel loops. Small volume of stool throughout the colon. There are scattered colonic diverticula without diverticulitis. Detailed bowel evaluation is limited given mild patient motion.  No retroperitoneal adenopathy. Abdominal aorta is tortuous in its course with moderate atherosclerosis. No aneurysm. No retroperitoneal adenopathy. Small fat containing umbilical hernia. No free air, free fluid, or intra-abdominal fluid collection. Appendix is not identified.  Within the pelvis the urinary bladder is near completely decompressed. Uterus is surgically absent. No adnexal mass. No pelvic free fluid. No inguinal adenopathy. There is fat in both inguinal canals.  The bones are under mineralized. There is moderate scoliotic curvature of the thoracolumbar spine with associated degenerative change. Compression deformity of T11 is again seen. Degenerative change about both hips, pubic symphysis, and sacroiliac joints. There are no acute or suspicious osseous abnormalities.  IMPRESSION: 1. Obstructing 4 mm stone at the left ureterovesicular junction with resultant mild left hydronephrosis and perinephric stranding. Stranding extends to the region of the left adrenal gland. 2. Chronic findings include hiatal hernia, atherosclerosis, small fat containing umbilical hernia, and chronic T11 compression fracture.   Electronically Signed   By: Rubye OaksMelanie  Ehinger M.D.   On: 03/14/2015 23:22     EKG Interpretation   Date/Time:  Wednesday Mar 14 2015 21:41:01 EDT Ventricular Rate:  73 PR Interval:  74 QRS Duration: 163 QT Interval:  461 QTC Calculation: 508 R Axis:   -5 Text Interpretation:  A-V dual-paced rhythm with some inhibition No  further analysis attempted due to paced rhythm Confirmed by Lincoln Brighamees, Liz  [phone]([id number]) on 03/14/2015 9:52:50 PM       MDM   Final diagnoses:  Acute UTI  Kidney stone on left side    Pt here with vomiting, LLQ pain. UA c/w UTI, CT scan with obstructing stone on left.  Patient is well perfused on exam but has leukocytosis, elevated lactate.  Discussed with Dr. Alma Friendlyalhstedt with Urology who will see the patient in consult.  Discussed with Hospitalist regarding admission for further management.  Patient and daughter updated of care plan and need for admission, possible surgery.      Tilden FossaElizabeth Grabiela Wohlford,  MD 03/15/15 0010

## 2015-03-14 NOTE — Progress Notes (Signed)
EDCM spoke to patient and her daughter Mickie HillierShirley Harris [phone] at bedside.  Patient lives at home with her daughter and her daughter's husband.  Patient wears oxygen at home at 2 liters [facility].  Patient does not ambulate at home.  Patient uses a wheelchair. Patient has a hoyer lift at home as well.  No further equipment.  Patient's pcp is Dr. Manus GunningEhinger.  Patient's daughter assists patient with her ADL's and meals at home.  Patient's daughter reports she is always with the patient.  Patient [facility]  Patient's daughter reports she is interested in receiving home health services for patient again and [facility] her final decision as to which agency she would like to choose.  EDCM provided patient's daughter with [place]  EDCM explained with home health, the patient may receive a RN, PT, OT aide and social worker if needed.  EDCM also provided patient's daughter with list of private duty nursing lists and explained it would be an out of pocket expense.  Patient and patient's daughter thankful for resources.  No further EDCM needs at this time.

## 2015-03-14 NOTE — ED Notes (Addendum)
PER EMS- pt picked from home c/o  Lower abd pain-5/10, n/v/d x1 day.  Pt hx of pacemaker and HTN. Pt lives with daughter who reports that pt hasn't been able to eat or drink today.  Pt arrived to ED alert and oriented. Pt on 2L home oxygen. Pt unable to ambulate per baseline.  Uses wheelchair at home.

## 2015-03-15 ENCOUNTER — Inpatient Hospital Stay (HOSPITAL_COMMUNITY): Payer: Medicare Other

## 2015-03-15 ENCOUNTER — Inpatient Hospital Stay (HOSPITAL_COMMUNITY): Payer: Medicare Other | Admitting: Anesthesiology

## 2015-03-15 ENCOUNTER — Encounter (HOSPITAL_COMMUNITY)
Admission: EM | Disposition: A | Discharge status: Home / Self Care | Payer: Self-pay | Source: Home / Self Care | Attending: Internal Medicine

## 2015-03-15 DIAGNOSIS — N10 Acute tubulo-interstitial nephritis: Secondary | ICD-10-CM

## 2015-03-15 DIAGNOSIS — A419 Sepsis, unspecified organism: Secondary | ICD-10-CM | POA: Insufficient documentation

## 2015-03-15 DIAGNOSIS — I10 Essential (primary) hypertension: Secondary | ICD-10-CM

## 2015-03-15 DIAGNOSIS — N390 Urinary tract infection, site not specified: Secondary | ICD-10-CM

## 2015-03-15 DIAGNOSIS — N136 Pyonephrosis: Secondary | ICD-10-CM | POA: Diagnosis present

## 2015-03-15 DIAGNOSIS — N201 Calculus of ureter: Secondary | ICD-10-CM | POA: Diagnosis present

## 2015-03-15 DIAGNOSIS — R6520 Severe sepsis without septic shock: Secondary | ICD-10-CM

## 2015-03-15 HISTORY — PX: CYSTOSCOPY WITH STENT PLACEMENT: SHX5790

## 2015-03-15 LAB — I-STAT CG4 LACTIC ACID, ED: LACTIC ACID, VENOUS: 2.43 mmol/L — AB (ref 0.5–2.0)

## 2015-03-15 LAB — CBC
HEMATOCRIT: 30.8 % — AB (ref 36.0–46.0)
Hemoglobin: 9.6 g/dL — ABNORMAL LOW (ref 12.0–15.0)
MCH: 25.1 pg — ABNORMAL LOW (ref 26.0–34.0)
MCHC: 31.2 g/dL (ref 30.0–36.0)
MCV: 80.6 fL (ref 78.0–100.0)
Platelets: 162 10*3/uL (ref 150–400)
RBC: 3.82 MIL/uL — ABNORMAL LOW (ref 3.87–5.11)
RDW: 15.2 % (ref 11.5–15.5)
WBC: 16.3 10*3/uL — ABNORMAL HIGH (ref 4.0–10.5)

## 2015-03-15 LAB — BASIC METABOLIC PANEL
Anion gap: 12 (ref 5–15)
BUN: 28 mg/dL — ABNORMAL HIGH (ref 6–20)
CO2: 25 mmol/L (ref 22–32)
Calcium: 9.1 mg/dL (ref 8.9–10.3)
Chloride: 104 mmol/L (ref 101–111)
Creatinine, Ser: 0.88 mg/dL (ref 0.44–1.00)
GFR calc Af Amer: >60 mL/min (ref >60)
GFR, EST NON AFRICAN AMERICAN: 56 mL/min — AB (ref >60)
Glucose, Bld: 143 mg/dL — ABNORMAL HIGH (ref 65–99)
POTASSIUM: 2.8 mmol/L — AB (ref 3.5–5.1)
Sodium: 141 mmol/L (ref 135–145)

## 2015-03-15 LAB — MRSA PCR SCREENING: MRSA by PCR: NEGATIVE

## 2015-03-15 LAB — CORTISOL: Cortisol, Plasma: 21.6 ug/dL

## 2015-03-15 SURGERY — CYSTOSCOPY, WITH STENT INSERTION
Anesthesia: General | Site: Ureter | Laterality: Left

## 2015-03-15 MED ORDER — ASPIRIN EC 81 MG PO TBEC
81.0000 mg | DELAYED_RELEASE_TABLET | Freq: Every day | ORAL | Status: DC
  Administered 2015-03-15 – 2015-03-25 (×11): 81 mg via ORAL
  Filled 2015-03-15 (×14): qty 1

## 2015-03-15 MED ORDER — VANCOMYCIN HCL IN DEXTROSE 750-5 MG/150ML-% IV SOLN
750.0000 mg | INTRAVENOUS | Status: DC
  Filled 2015-03-15: qty 150

## 2015-03-15 MED ORDER — FENTANYL CITRATE (PF) 100 MCG/2ML IJ SOLN: 25.0000 ug | Freq: Once | INTRAMUSCULAR | Status: DC

## 2015-03-15 MED ORDER — DEXTROSE 5 % IV SOLN
1.0000 g | Freq: Two times a day (BID) | INTRAVENOUS | Status: DC
  Administered 2015-03-15 – 2015-03-16 (×3): 1 g via INTRAVENOUS
  Filled 2015-03-15 (×4): qty 1

## 2015-03-15 MED ORDER — DEXAMETHASONE SODIUM PHOSPHATE 10 MG/ML IJ SOLN
INTRAMUSCULAR | Status: DC | PRN
  Administered 2015-03-15: 10 mg via INTRAVENOUS

## 2015-03-15 MED ORDER — SODIUM CHLORIDE 0.9 % IV BOLUS (SEPSIS)
500.0000 mL | INTRAVENOUS | Status: AC
  Administered 2015-03-15: 500 mL via INTRAVENOUS

## 2015-03-15 MED ORDER — LACTATED RINGERS IV SOLN
INTRAVENOUS | Status: DC | PRN
  Administered 2015-03-15: 01:00:00 via INTRAVENOUS

## 2015-03-15 MED ORDER — METOPROLOL TARTRATE 1 MG/ML IV SOLN: 5.0000 mg | Freq: Once | INTRAVENOUS | Status: DC

## 2015-03-15 MED ORDER — SODIUM CHLORIDE 0.9 % IV SOLN
INTRAVENOUS | Status: DC
  Administered 2015-03-15 (×2): via INTRAVENOUS

## 2015-03-15 MED ORDER — POLYVINYL ALCOHOL 1.4 % OP SOLN
Freq: Two times a day (BID) | OPHTHALMIC | Status: DC
Start: 2015-03-15 — End: 2015-03-25
  Administered 2015-03-15: 1 [drp] via OPHTHALMIC
  Administered 2015-03-15 – 2015-03-22 (×12): via OPHTHALMIC
  Administered 2015-03-22: 2 [drp] via OPHTHALMIC
  Administered 2015-03-23: 1 [drp] via OPHTHALMIC
  Administered 2015-03-23 – 2015-03-24 (×3): via OPHTHALMIC
  Administered 2015-03-25: 1 [drp] via OPHTHALMIC
  Filled 2015-03-15 (×3): qty 15

## 2015-03-15 MED ORDER — 0.9 % SODIUM CHLORIDE (POUR BTL) OPTIME
TOPICAL | Status: DC | PRN
  Administered 2015-03-15: 1000 mL

## 2015-03-15 MED ORDER — SODIUM CHLORIDE 0.9 % IV BOLUS (SEPSIS): 500.0000 mL | Freq: Once | INTRAVENOUS | Status: DC

## 2015-03-15 MED ORDER — VANCOMYCIN HCL IN DEXTROSE 1-5 GM/200ML-% IV SOLN
1000.0000 mg | INTRAVENOUS | Status: AC
  Administered 2015-03-15: 1000 mg via INTRAVENOUS
  Filled 2015-03-15: qty 200

## 2015-03-15 MED ORDER — ACETAMINOPHEN 325 MG PO TABS
650.0000 mg | ORAL_TABLET | Freq: Four times a day (QID) | ORAL | Status: DC | PRN
  Administered 2015-03-19: 650 mg via ORAL
  Filled 2015-03-15 (×2): qty 2

## 2015-03-15 MED ORDER — PANTOPRAZOLE SODIUM 40 MG PO TBEC
40.0000 mg | DELAYED_RELEASE_TABLET | Freq: Every day | ORAL | Status: DC
  Administered 2015-03-15 – 2015-03-25 (×11): 40 mg via ORAL
  Filled 2015-03-15 (×13): qty 1

## 2015-03-15 MED ORDER — FENTANYL CITRATE (PF) 100 MCG/2ML IJ SOLN
INTRAMUSCULAR | Status: AC
  Filled 2015-03-15: qty 2

## 2015-03-15 MED ORDER — PROPOFOL 10 MG/ML IV BOLUS
INTRAVENOUS | Status: DC | PRN
  Administered 2015-03-15: 130 mg via INTRAVENOUS

## 2015-03-15 MED ORDER — SERTRALINE HCL 100 MG PO TABS
100.0000 mg | ORAL_TABLET | Freq: Every day | ORAL | Status: DC
  Administered 2015-03-15 – 2015-03-21 (×7): 100 mg via ORAL
  Filled 2015-03-15 (×9): qty 1

## 2015-03-15 MED ORDER — ONDANSETRON HCL 4 MG/2ML IJ SOLN
INTRAMUSCULAR | Status: AC
  Filled 2015-03-15: qty 2

## 2015-03-15 MED ORDER — HYDROCORTISONE NA SUCCINATE PF 100 MG IJ SOLR
100.0000 mg | Freq: Three times a day (TID) | INTRAMUSCULAR | Status: DC
  Administered 2015-03-15: 100 mg via INTRAVENOUS
  Filled 2015-03-15: qty 2

## 2015-03-15 MED ORDER — HYDROCORTISONE NA SUCCINATE PF 100 MG IJ SOLR
50.0000 mg | Freq: Three times a day (TID) | INTRAMUSCULAR | Status: DC
  Administered 2015-03-15 – 2015-03-16 (×2): 50 mg via INTRAVENOUS
  Filled 2015-03-15 (×2): qty 2

## 2015-03-15 MED ORDER — SODIUM CHLORIDE 0.9 % IJ SOLN
3.0000 mL | Freq: Two times a day (BID) | INTRAMUSCULAR | Status: DC
  Administered 2015-03-15 – 2015-03-25 (×17): 3 mL via INTRAVENOUS

## 2015-03-15 MED ORDER — SODIUM CHLORIDE 0.9 % IV BOLUS (SEPSIS)
500.0000 mL | Freq: Once | INTRAVENOUS | Status: AC
  Administered 2015-03-15: 500 mL via INTRAVENOUS

## 2015-03-15 MED ORDER — ACETAMINOPHEN 650 MG RE SUPP
650.0000 mg | Freq: Four times a day (QID) | RECTAL | Status: DC | PRN
  Administered 2015-03-15: 650 mg via RECTAL
  Filled 2015-03-15: qty 1

## 2015-03-15 MED ORDER — HEPARIN SODIUM (PORCINE) 5000 UNIT/ML IJ SOLN: 5000.0000 [IU] | Freq: Three times a day (TID) | INTRAMUSCULAR | Status: DC

## 2015-03-15 MED ORDER — SODIUM CHLORIDE 0.9 % IV SOLN
INTRAVENOUS | Status: DC
Start: 2015-03-15 — End: 2015-03-16
  Administered 2015-03-15: 100 mL/h via INTRAVENOUS
  Administered 2015-03-16: 01:00:00 via INTRAVENOUS

## 2015-03-15 MED ORDER — HEPARIN SODIUM (PORCINE) 5000 UNIT/ML IJ SOLN
5000.0000 [IU] | Freq: Three times a day (TID) | INTRAMUSCULAR | Status: DC
  Administered 2015-03-15 – 2015-03-25 (×29): 5000 [IU] via SUBCUTANEOUS
  Filled 2015-03-15 (×34): qty 1

## 2015-03-15 MED ORDER — POTASSIUM CHLORIDE CRYS ER 20 MEQ PO TBCR
40.0000 meq | EXTENDED_RELEASE_TABLET | ORAL | Status: AC
  Administered 2015-03-15 (×2): 40 meq via ORAL
  Filled 2015-03-15 (×2): qty 2

## 2015-03-15 MED ORDER — SODIUM CHLORIDE 0.9 % IR SOLN
Status: DC | PRN
  Administered 2015-03-15: 3000 mL

## 2015-03-15 MED ORDER — ONDANSETRON HCL 4 MG/2ML IJ SOLN
INTRAMUSCULAR | Status: DC | PRN
  Administered 2015-03-15: 4 mg via INTRAVENOUS

## 2015-03-15 MED ORDER — PROPOFOL 10 MG/ML IV BOLUS
INTRAVENOUS | Status: AC
  Filled 2015-03-15: qty 20

## 2015-03-15 MED ORDER — DEXTROSE 5 % IV SOLN: 1.0000 g | INTRAVENOUS | Status: DC

## 2015-03-15 MED ORDER — DEXAMETHASONE SODIUM PHOSPHATE 10 MG/ML IJ SOLN
INTRAMUSCULAR | Status: AC
  Filled 2015-03-15: qty 1

## 2015-03-15 SURGICAL SUPPLY — 5 items
BASKET ZERO TIP NITINOL 2.4FR (BASKET) ×2 IMPLANT
BSKT STON RTRVL ZERO TP 2.4FR (BASKET) ×1
PACK CYSTO (CUSTOM PROCEDURE TRAY) ×2 IMPLANT
SHEATH ACCESS URETERAL 24CM (SHEATH) ×2 IMPLANT
STENT URET 6FRX24 CONTOUR (STENTS) ×2 IMPLANT

## 2015-03-15 NOTE — Op Note (Signed)
Preoperative diagnosis:Infected/obstructing left ureteral stone  Postoperative diagnosis:Same   Procedure:Cystoscopy, left double-J stent placement  Surgeon: Bertram MillardStephen M. Elizaveta Mattice, M.D.   Anesthesia: Gen.   Complications:None  Specimen(s):None  Drain(s):24 cm x 6 [place]French contour double-J stent without string  Indications:81 year old female who presented to the emergency room late on 03/14/2015 with left flank pain. Evaluation in the emergency room revealeda mild urinary tract infection, significant left hydronephrosis secondary to a 4 mm left UVJ stone. Due to the patient's age, her medical condition and pyuria, it was recommended urgent  decompression of the left renal unit be performed. I spoke with the patient and her daughter,  Brenda Cook,who consented to the procedure.   Technique and findings:the patient was properly identified and marked in the holding area. She had received preoperative IV antibiotics. She was taken the operating room where general endotracheal anesthetic was administered.  She was placed in the dorsolithotomy position. Genitalia and perineum were prepped and draped. Proper timeout was performed.  A 21 French panendoscope was advanced into her bladder. Circumferential inspection of the bladder was performed. Mild prominent  Lead vessels, no other  Significant abnormalities were seen. Ureteral orifices were normal in location. There wasan obvious stone approximate to the left ureteral orifice as this was somewhat enlarged. The stone was not seen through the orifice, however.A0.038 inch sensor-tip guidewire was navigated into the left ureteral orificeand up to the left renal pelvis using fluoroscopic guidance. Once adequate positioningwas obtained, I dilated the ureteral orifice first with the inner core and and the entire 12/14 ureteral access sheath.Cystoscopically, after the access sheath was removed,I passed a Nitinol basket up into the left distal ureter. I tried engaging  the stone. This was unsuccessful, however. At this point, I then placed a 6 [place]French by 24 cm contour double-J stent using fluoroscopic/cystoscopic guidance. Good proximal and distal curls wereidentified following removal of the guidewire.At this point, the bladder was drained and the procedure terminated.Patient was awakened and taken to the PACU and condition. She tolerated the procedure well.

## 2015-03-15 NOTE — Progress Notes (Signed)
Day of Surgery Subjective: Patient reports that she is feeling much better. She is still lethargic, but not complaining of pain. I am aware of her hypotension overnight.Blood pressure now in the 80s. She is not tachycardic.  Objective: Vital signs in last 24 hours: Temp:  [97.7 F (36.5 C)-100.1 F (37.8 C)] 98 F (36.7 C) (05/19 0800) Pulse Rate:  [33-116] 69 (05/19 1100) Resp:  [15-33] 19 (05/19 1100) BP: (59-189)/(30-88) 100/51 mmHg (05/19 1100) SpO2:  [73 %-100 %] 98 % (05/19 1100) Weight:  [130 lb 11.7 oz (59.3 kg)] 130 lb 11.7 oz (59.3 kg) (05/19 0321)  Intake/Output from previous day: 05/18 0701 - 05/19 0700 In: 1574.7 [I.V.:1074.7; IV Piggyback:500] Out: 2 [Urine:2] Intake/Output this shift: Total I/O In: 1250 [P.O.:300; I.V.:900; IV Piggyback:50] Out: -   Physical Exam:  Constitutional: Vital signs reviewed. WD WN in NAD   Slightly lethargic Eyes: PERRL, No scleral icterus.   Cardiovascular: RRR Pulmonary/Chest: Normal effort   Lab Results:  Recent Labs  03/14/15 2132 03/15/15 0640  HGB 11.8* 9.6*  HCT 37.8 30.8*   BMET  Recent Labs  03/14/15 2132 03/15/15 0640  NA 139 141  K 3.5 2.8*  CL 102 104  CO2 29 25  GLUCOSE 162* 143*  BUN 28* 28*  CREATININE 0.85 0.88  CALCIUM 10.2 9.1   No results for input(s): LABPT, INR in the last 72 hours. No results for input(s): LABURIN in the last 72 hours. Results for orders placed or performed during the hospital encounter of 03/14/15  MRSA PCR Screening     Status: None   Collection Time: 03/15/15  3:22 AM  Result Value Ref Range Status   MRSA by PCR NEGATIVE NEGATIVE Final    Comment:        The GeneXpert MRSA Assay (FDA approved for NASAL specimens only), is one component of a comprehensive MRSA colonization surveillance program. It is not intended to diagnose MRSA infection nor to guide or monitor treatment for MRSA infections.     Studies/Results: Ct Abdomen Pelvis Wo Contrast  03/14/2015    CLINICAL DATA:  Lower abdominal pain, nausea, vomiting and diarrhea for 1 day.  EXAM: CT ABDOMEN AND PELVIS WITHOUT CONTRAST  TECHNIQUE: Multidetector CT imaging of the abdomen and pelvis was performed following the standard protocol without IV contrast.  COMPARISON:  CT 07/28/2011  FINDINGS: Evaluation of the lung bases demonstrates cardiomegaly and coronary artery calcifications. Pacemaker is partially included. There is significant tortuosity of the distal descending thoracic aorta. Chronic changes noted at the lung bases with pleural-parenchymal scarring.  There is a moderate hiatal hernia, mildly progressed in size compared to prior exam. The unenhanced liver, gallbladder, spleen, and right adrenal gland are normal. The left adrenal gland is suboptimally defined, there is soft tissue stranding in the left upper quadrant in the region of the adrenal gland. Pancreas is atrophic without surrounding inflammatory change.  There is a 4 mm obstructing stone at the left ureterovesicular junction with resultant mild hydronephrosis. Perinephric stranding noted about the left kidney. Additional punctate nonobstructing stones in the mid and lower left kidney. There is thinning of the renal parenchyma. Thinning of the right renal parenchyma without hydronephrosis. Kidneys are both inferiorly located and slightly ectatic in appearance due to scoliosis.  The stomach is decompressed. There are no dilated or thickened bowel loops. Small volume of stool throughout the colon. There are scattered colonic diverticula without diverticulitis. Detailed bowel evaluation is limited given mild patient motion.  No retroperitoneal adenopathy. Abdominal  aorta is tortuous in its course with moderate atherosclerosis. No aneurysm. No retroperitoneal adenopathy. Small fat containing umbilical hernia. No free air, free fluid, or intra-abdominal fluid collection. Appendix is not identified.  Within the pelvis the urinary bladder is near  completely decompressed. Uterus is surgically absent. No adnexal mass. No pelvic free fluid. No inguinal adenopathy. There is fat in both inguinal canals.  The bones are under mineralized. There is moderate scoliotic curvature of the thoracolumbar spine with associated degenerative change. Compression deformity of T11 is again seen. Degenerative change about both hips, pubic symphysis, and sacroiliac joints. There are no acute or suspicious osseous abnormalities.  IMPRESSION: 1. Obstructing 4 mm stone at the left ureterovesicular junction with resultant mild left hydronephrosis and perinephric stranding. Stranding extends to the region of the left adrenal gland. 2. Chronic findings include hiatal hernia, atherosclerosis, small fat containing umbilical hernia, and chronic T11 compression fracture.   Electronically Signed   By: Rubye OaksMelanie  Ehinger M.D.   On: 03/14/2015 23:22   Dg Chest Port 1 View  03/15/2015   CLINICAL DATA:  CHF.  Vomiting.  Upper abdominal pain.  EXAM: PORTABLE CHEST - 1 VIEW  COMPARISON:  10/04/2014  FINDINGS: The patient is rotated. Lower lung volumes from prior exam. Enlargement of the cardiac silhouette with large hiatal hernia. Left-sided pacemaker overlying the chest wall with intact leads. Right-sided pacemaker with disruption of inferior lead, unchanged. Increased interstitial opacity from prior suspicious for pulmonary edema. No pleural effusion. Probable fibrotic change at the lung bases.  IMPRESSION: Interstitial opacities, suspect pulmonary edema. Low lung volumes with cardiomegaly. Findings may reflect CHF.   Electronically Signed   By: Rubye OaksMelanie  Ehinger M.D.   On: 03/15/2015 01:17    Assessment/Plan:   status post urgent stenting on left for symptomatic, infected left distal ureteral stone. She seems to be doing better.  She still has hypotension,ut seems to be fairly stable  Continue management per medical service.Nofurther intervention needed at this point. Will eventually  perform anesthetic ureteroscopy once her medical situation is stable and her infection treated.   LOS: 1 day   Chelsea AusDAHLSTEDT, Donyel Castagnola M 03/15/2015, 12:26 PM

## 2015-03-15 NOTE — Progress Notes (Signed)
Date:  May 19,2016 U.R. performed for needs and level of care. Will continue to follow for Case Management needs.  Rhonda Davis, RN, BSN, CCM   [phone] 

## 2015-03-15 NOTE — Progress Notes (Signed)
PROGRESS NOTE    Brenda Cook DGL:[id number] DOB: 08/26/1921 DOA: 03/14/2015 PCP: Thora LanceEHINGER,ROBERT R, MD  HPI/Brief narrative 81 y.o. female who presents to the ED with LLQ and left flank pain, N/V, multiple episodes of NBNB vomiting. Small amount of diarrhea. No fevers no dysuria, no chest pain, no SOB. Symptoms are moderate, constant, unchanging, nothing makes better or worse.  W/U in ED finds sepsis with tachypnea, elevated WBC. UA is c/w UTI. CT abd/pelvis demonstrates obstructing stone L ureter.  Assessment/Plan:  Severe sepsis, present on admission-secondary to acute pyelonephritis/infected left distal ureteral stone - Admitted to stepdown unit - Hydrated aggressively with IV fluids, initially started on Rocephin which was changed to IV vancomycin and cefepime pending culture results - Despite IV fluid boluses, blood pressures are soft in the 90s (as per family, patient probably has chronic low blood pressures).  - Checking random cortisol and started stress dose IV hydrocortisone in the interim - Discussed extensively with patient's daughter/healthcare power of attorney who has made her a Limited code as outlined below, does not want central line despite being made aware of possible need of vasopressors. She is however okay with trying brief vasopressors via PIV.  - Although chest x-ray is read as possible CHF, clinically patient is volume depleted and not in CHF  Acute pyelonephritis secondary to obstructing/infected left distal ureteral stone with left hydronephrosis  - Urology was consulted and patient underwent urgent cystoscopy and left double-J stent placement on 5/19  - Antibiotic management as above  - As per urology, patient will eventually need anesthetic ureteroscopy once her medical situation is stabilized and her infection treated.   Hypokalemia - Replace aggressively and follow  Anemia - Stable  Essential hypertension/hypotension - Hypotension has improved  after IV fluid boluses. Continue gentle maintenance IV fluids overnight. Hypotension related to intravascular volume depletion and sepsis - Holding antihypertensives (HCTZ and metoprolol). - Hopefully will not require pressors.  Paroxysmal A. fib/permanent pacemaker - AV paced rhythm on telemetry - Resume beta blockers when blood pressure allows - Probably not a candidate for anticoagulation due to advanced age. Continue aspirin - CHA2DS2-VASc score: 6/high risk  GERD - Continue PPI  Macular degeneration/legally blind and hearing impairment   Code Status: Limited code: No CPR/defibrillation/intubation/BiPAP/central line. Family Communication: Discussed extensively on multiple occasions with patient's daughter/healthcare power of attorney Ms. Brenda Cook. Advised her regarding patient's critical illness. Disposition Plan: Continue treatment in stepdown unit for additional 24 hours.    Consultants:  Urology   Procedures:  urgent cystoscopy and left double-J stent placement on 5/19   Antibiotics:  IV Rocephin 1 dose on 5/18   IV cefepime 5/19 >   IV vancomycin 5/19 >  Subjective: Denied complaints. As per nursing this morning, persistent hypotension despite couple of boluses of normal saline overnight.  Objective: Filed Vitals:   03/15/15 1230 03/15/15 1245 03/15/15 1300 03/15/15 1315  BP: 93/47 87/43 95/43  91/53  Pulse: 70 70 73 70  Temp:      TempSrc:      Resp: 23 18 23 23   Height:      Weight:      SpO2: 95% 100% 98% 97%   MAXIMUM TEMPERATURE: 100.80F   Intake/Output Summary (Last 24 hours) at 03/15/15 1325 Last data filed at 03/15/15 1300  Gross per 24 hour  Intake 3224.67 ml  Output      2 ml  Net 3222.67 ml   Filed Weights   03/15/15 0321  Weight: 59.3 kg (130  lb 11.7 oz)     Exam:  General exam: Moderately built and thinly nourished pleasant elderly female lying comfortably supine in bed. Oral mucosa dry.  Respiratory system: occasional  basal crackles but otherwise clear to auscultation. No increased work of breathing. Cardiovascular system: S1 & S2 heard, RRR. No JVD, murmurs, gallops, clicks or pedal edema. Telemetry: AV paced rhythm.  Gastrointestinal system: Abdomen is nondistended, soft and nontender. Normal bowel sounds heard. Central nervous system: Alert and oriented 2. No focal neurological deficits. Decreased visual acuity and hard of hearing  Extremities: Symmetric 5 x 5 power.   Data Reviewed: Basic Metabolic Panel:  Recent Labs Lab 03/14/15 2132 03/15/15 0640  NA 139 141  K 3.5 2.8*  CL 102 104  CO2 29 25  GLUCOSE 162* 143*  BUN 28* 28*  CREATININE 0.85 0.88  CALCIUM 10.2 9.1   Liver Function Tests:  Recent Labs Lab 03/14/15 2132  AST 31  ALT 24  ALKPHOS 108  BILITOT 0.6  PROT 7.7  ALBUMIN 4.0    Recent Labs Lab 03/14/15 2132  LIPASE 17*   No results for input(s): AMMONIA in the last 168 hours. CBC:  Recent Labs Lab 03/14/15 2132 03/15/15 0640  WBC 16.9* 16.3*  NEUTROABS 15.7*  --   HGB 11.8* 9.6*  HCT 37.8 30.8*  MCV 80.9 80.6  PLT 232 162   Cardiac Enzymes: No results for input(s): CKTOTAL, CKMB, CKMBINDEX, TROPONINI in the last 168 hours. BNP (last 3 results) No results for input(s): PROBNP in the last 8760 hours. CBG: No results for input(s): GLUCAP in the last 168 hours.  Recent Results (from the past 240 hour(s))  MRSA PCR Screening     Status: None   Collection Time: 03/15/15  3:22 AM  Result Value Ref Range Status   MRSA by PCR NEGATIVE NEGATIVE Final    Comment:        The GeneXpert MRSA Assay (FDA approved for NASAL specimens only), is one component of a comprehensive MRSA colonization surveillance program. It is not intended to diagnose MRSA infection nor to guide or monitor treatment for MRSA infections.           Studies: Ct Abdomen Pelvis Wo Contrast  03/14/2015   CLINICAL DATA:  Lower abdominal pain, nausea, vomiting and diarrhea for  1 day.  EXAM: CT ABDOMEN AND PELVIS WITHOUT CONTRAST  TECHNIQUE: Multidetector CT imaging of the abdomen and pelvis was performed following the standard protocol without IV contrast.  COMPARISON:  CT 07/28/2011  FINDINGS: Evaluation of the lung bases demonstrates cardiomegaly and coronary artery calcifications. Pacemaker is partially included. There is significant tortuosity of the distal descending thoracic aorta. Chronic changes noted at the lung bases with pleural-parenchymal scarring.  There is a moderate hiatal hernia, mildly progressed in size compared to prior exam. The unenhanced liver, gallbladder, spleen, and right adrenal gland are normal. The left adrenal gland is suboptimally defined, there is soft tissue stranding in the left upper quadrant in the region of the adrenal gland. Pancreas is atrophic without surrounding inflammatory change.  There is a 4 mm obstructing stone at the left ureterovesicular junction with resultant mild hydronephrosis. Perinephric stranding noted about the left kidney. Additional punctate nonobstructing stones in the mid and lower left kidney. There is thinning of the renal parenchyma. Thinning of the right renal parenchyma without hydronephrosis. Kidneys are both inferiorly located and slightly ectatic in appearance due to scoliosis.  The stomach is decompressed. There are no dilated or thickened bowel  loops. Small volume of stool throughout the colon. There are scattered colonic diverticula without diverticulitis. Detailed bowel evaluation is limited given mild patient motion.  No retroperitoneal adenopathy. Abdominal aorta is tortuous in its course with moderate atherosclerosis. No aneurysm. No retroperitoneal adenopathy. Small fat containing umbilical hernia. No free air, free fluid, or intra-abdominal fluid collection. Appendix is not identified.  Within the pelvis the urinary bladder is near completely decompressed. Uterus is surgically absent. No adnexal mass. No pelvic  free fluid. No inguinal adenopathy. There is fat in both inguinal canals.  The bones are under mineralized. There is moderate scoliotic curvature of the thoracolumbar spine with associated degenerative change. Compression deformity of T11 is again seen. Degenerative change about both hips, pubic symphysis, and sacroiliac joints. There are no acute or suspicious osseous abnormalities.  IMPRESSION: 1. Obstructing 4 mm stone at the left ureterovesicular junction with resultant mild left hydronephrosis and perinephric stranding. Stranding extends to the region of the left adrenal gland. 2. Chronic findings include hiatal hernia, atherosclerosis, small fat containing umbilical hernia, and chronic T11 compression fracture.   Electronically Signed   By: Rubye Oaks M.D.   On: 03/14/2015 23:22   Dg Chest Port 1 View  03/15/2015   CLINICAL DATA:  CHF.  Vomiting.  Upper abdominal pain.  EXAM: PORTABLE CHEST - 1 VIEW  COMPARISON:  10/04/2014  FINDINGS: The patient is rotated. Lower lung volumes from prior exam. Enlargement of the cardiac silhouette with large hiatal hernia. Left-sided pacemaker overlying the chest wall with intact leads. Right-sided pacemaker with disruption of inferior lead, unchanged. Increased interstitial opacity from prior suspicious for pulmonary edema. No pleural effusion. Probable fibrotic change at the lung bases.  IMPRESSION: Interstitial opacities, suspect pulmonary edema. Low lung volumes with cardiomegaly. Findings may reflect CHF.   Electronically Signed   By: Rubye Oaks M.D.   On: 03/15/2015 01:17        Scheduled Meds: . aspirin EC  81 mg Oral Q breakfast  . ceFEPime (MAXIPIME) IV  1 g Intravenous Q12H  . fentaNYL      . heparin  5,000 Units Subcutaneous 3 times per day  . hydrocortisone sod succinate (SOLU-CORTEF) inj  100 mg Intravenous Q8H  . pantoprazole  40 mg Oral Q breakfast  . polyvinyl alcohol   Both Eyes BID  . sertraline  100 mg Oral QHS  . sodium  chloride  3 mL Intravenous Q12H  . [START ON 03/16/2015] vancomycin  750 mg Intravenous Q24H   Continuous Infusions: . sodium chloride 100 mL/hr (03/15/15 1324)    Principal Problem:   Pyohydronephrosis Active Problems:   Essential hypertension   Sepsis secondary to UTI   Left ureteral stone    Time spent:55 minutes     Loris Winrow, MD, FACP, FHM. [facility] Pager [phone]  If 7PM-7AM, please contact night-coverage [email or web address] Password [email or web address] 03/15/2015, 1:25 PM    LOS: 1 day

## 2015-03-15 NOTE — Progress Notes (Signed)
Initial Nutrition Assessment  INTERVENTION: - Boost Plus BID, each supplement provides 360 kcal and 14 g protein  NUTRITION DIAGNOSIS:  Inadequate oral intake related to acute illness as evidenced by other (see comment) (poor po).  GOAL:  Patient will meet greater than or equal to 90% of their needs  MONITOR:  PO intake, Supplement acceptance, Labs, Weight trends  REASON FOR ASSESSMENT:  Low Braden    ASSESSMENT: 81 y.o. female who presents to the ED with LLQ and left flank pain, N/V, multiple episodes of NBNB vomiting. Small amount of diarrhea.  5/19: pt s/p Cystoscopy, left double-J stent placement  Spoke with pt and family member in room. Pt was eating well up until 2-3 days prior to admission. She ate a good breakfast this morning with feeding assistance. Per RN, pt with no problems chewing. No recent weight loss. Pt drinks Boost supplements daily. RD to order while in hospital.   Labs and medications reviewed  K 2.8  BUN 28  Height:  Ht Readings from Last 1 Encounters:  03/15/15 5\' 3"  (1.6 m)    Weight:  Wt Readings from Last 1 Encounters:  03/15/15 130 lb 11.7 oz (59.3 kg)    Ideal Body Weight:  52.3 kg  Wt Readings from Last 10 Encounters:  03/15/15 130 lb 11.7 oz (59.3 kg)  03/06/15 128 lb (58.06 kg)  10/04/14 142 lb (64.411 kg)  06/15/14 142 lb (64.411 kg)  02/20/14 146 lb (66.225 kg)  12/23/12 142 lb 6.4 oz (64.592 kg)  12/17/12 138 lb (62.596 kg)  07/14/12 128 lb (58.06 kg)  01/16/12 128 lb (58.06 kg)  12/24/11 128 lb (58.06 kg)    BMI:  Body mass index is 23.16 kg/(m^2).  Estimated Nutritional Needs:  Kcal:  1500-1700  Protein:  75-85 g  Fluid:  1.7 L/day  Skin:  Reviewed, no issues  Diet Order:  Diet regular Room service appropriate?: Yes; Fluid consistency:: Thin  EDUCATION NEEDS:   No educations needs at this time.   Intake/Output Summary (Last 24 hours) at 03/15/15 1210 Last data filed at 03/15/15 1100  Gross per 24  hour  Intake 2824.67 ml  Output      2 ml  Net 2822.67 ml    Last BM:  Prior to admission  Emmaline KluverHaley Michaelyn Wall MS, RD, LDN [phone]

## 2015-03-15 NOTE — Anesthesia Procedure Notes (Signed)
Procedure Name: Intubation Date/Time: 03/15/2015 1:23 AM Performed by: Delphia GratesHANDLER, Kino Dunsworth Pre-anesthesia Checklist: Patient identified, Emergency Drugs available, Suction available and Patient being monitored Patient Re-evaluated:Patient Re-evaluated prior to inductionOxygen Delivery Method: Circle system utilized Preoxygenation: Pre-oxygenation with 100% oxygen Intubation Type: IV induction, Rapid sequence and Cricoid Pressure applied Laryngoscope Size: Mac and 3 Grade View: Grade II Tube type: Oral Tube size: 7.5 mm Number of attempts: 1 Airway Equipment and Method: Stylet

## 2015-03-15 NOTE — Consult Note (Signed)
Urology Consult   Physician requesting consult: Tilden FossaElizabeth Rees, M.D.  Reason for consult: Kidney stone  History of Present Illness: Brenda Cook is a 81 y.o. female who [facility] earlier this evening with a one-day history of left lower quadrant pain, nausea, vomiting. This was fairly sudden in onset. She has had no associated fever, although she has had chills. There is no prior history of kidney stones. She was treated for urinary tract infection earlier this year following a hospitalization for a right shoulder fracture.  In the emergency room, she was found to have mild pyuria. CT the abdomen and pelvis was performed because of her abdominal pain. This revealed a 4 mm left ureterovesical junction stone with associated moderate left hydroureteronephrosis and perinephric stranding. There is remaining left renal stone. Urology was consulted.  There is evidence of leukocytosis. The patient does have congestive heart failure with a history of complete heart block. She does have a pacemaker. She is oxygen dependent. There is no prior history of urolithiasis.    Past Medical History  Diagnosis Date  . Hypertension   . Hypertensive cardiovascular disease   . Peripheral edema   . Chronic cough   . GERD (gastroesophageal reflux disease)   . Arthritis   . Atrial fibrillation   . Complete heart block     Has old Thera pacemaker on right side that is turned off and new MDT Versa unit on left  . Stroke     Past Surgical History  Procedure Laterality Date  . Partial hysterectomy    . Appendectomy    . Tonsillectomy and adenoidectomy    . Insert / replace / remove pacemaker  7/10  . Insert / replace / remove pacemaker  5/79  . Insert / replace / remove pacemaker  7/95  . Shoulder surgery    . Tendon repair  07/20/2012    Procedure: TENDON REPAIR;  Surgeon: Nicki ReaperGary R Kuzma, MD;  Location: [facility];  Service:  Orthopedics;  Laterality: Left;  Reconstruction Extensor hood Left middle finger and left ring finger  . Repair extensor tendon Right 12/17/2012    Procedure: REPAIR EXTENSOR TENDON;  Surgeon: Nicki ReaperGary R Kuzma, MD;  Location: [facility];  Service: Orthopedics;  Laterality: Right;  CENTRALIZATION EXTENSOR TENDON RIGHT RING FINGER/RIGHT SMALL FINGER ANESTHESIA     Current Hospital Medications: Scheduled Meds: . aspirin EC  81 mg Oral Q breakfast  . pantoprazole  40 mg Oral Q breakfast  . Polyethyl Glycol-Propyl Glycol   Both Eyes BID  . sertraline  100 mg Oral QHS   Continuous Infusions:  PRN Meds:.  Allergies:  Allergies  Allergen Reactions  . Depakote [Valproic Acid] Other (See Comments)    disorientation  . Benicar [Olmesartan Medoxomil] Other (See Comments)    Not effective    Family History  Problem Relation Age of Onset  . Stroke Father   . Heart failure Mother   . Leukemia Sister     Social History:  reports that she has never smoked. She does not have any smokeless tobacco history on file. She reports that she does not drink alcohol or use illicit drugs.  ROS: A complete review of systems was performed.  All systems are negative except for pertinent findings as noted.  Physical Exam:  Vital signs in last 24 hours: Temp:  [97.7 F (36.5 C)] 97.7 F (36.5 C) (05/18 1949) Pulse Rate:  [70-82] 70 (  05/19 0013) Resp:  [16-33] 24 (05/19 0013) BP: (120-143)/(55-75) 139/61 mmHg (05/19 0013) SpO2:  [95 %-99 %] 95 % (05/19 0013) General:  She is in mild distress. She somewhat lethargic. HEENT: Normocephalic, atraumatic Neck: No JVD or lymphadenopathy Cardiovascular: Regular rate/paced rhythm Lungs: Clear bilaterally, although she has poor and respiratory/expiratory excursion Abdomen: Soft, moderate left lower quadrant tenderness, no abdominal masses Back: Mild left CVA tenderness Extremities: No edema Neurologic: Grossly intact  Laboratory Data:    Recent Labs  03/14/15 2132  WBC 16.9*  HGB 11.8*  HCT 37.8  PLT 232     Recent Labs  03/14/15 2132  NA 139  K 3.5  CL 102  GLUCOSE 162*  BUN 28*  CALCIUM 10.2  CREATININE 0.85     Results for orders placed or performed during the hospital encounter of 03/14/15 (from the past 24 hour(s))  Comprehensive metabolic panel     Status: Abnormal   Collection Time: 03/14/15  9:32 PM  Result Value Ref Range   Sodium 139 135 - 145 mmol/L   Potassium 3.5 3.5 - 5.1 mmol/L   Chloride 102 101 - 111 mmol/L   CO2 29 22 - 32 mmol/L   Glucose, Bld 162 (H) 65 - 99 mg/dL   BUN 28 (H) 6 - 20 mg/dL   Creatinine, Ser 1.61 0.44 - 1.00 mg/dL   Calcium 09.6 8.9 - 04.5 mg/dL   Total Protein 7.7 6.5 - 8.1 g/dL   Albumin 4.0 3.5 - 5.0 g/dL   AST 31 15 - 41 U/L   ALT 24 14 - 54 U/L   Alkaline Phosphatase 108 38 - 126 U/L   Total Bilirubin 0.6 0.3 - 1.2 mg/dL   GFR calc non Af Amer 58 (L) >60 mL/min   GFR calc Af Amer >60 >60 mL/min   Anion gap 8 5 - 15  CBC with Differential     Status: Abnormal   Collection Time: 03/14/15  9:32 PM  Result Value Ref Range   WBC 16.9 (H) 4.0 - 10.5 K/uL   RBC 4.67 3.87 - 5.11 MIL/uL   Hemoglobin 11.8 (L) 12.0 - 15.0 g/dL   HCT 40.9 81.1 - 91.4 %   MCV 80.9 78.0 - 100.0 fL   MCH 25.3 (L) 26.0 - 34.0 pg   MCHC 31.2 30.0 - 36.0 g/dL   RDW 78.2 95.6 - 21.3 %   Platelets 232 150 - 400 K/uL   Neutrophils Relative % 93 (H) 43 - 77 %   Neutro Abs 15.7 (H) 1.7 - 7.7 K/uL   Lymphocytes Relative 2 (L) 12 - 46 %   Lymphs Abs 0.4 (L) 0.7 - 4.0 K/uL   Monocytes Relative 5 3 - 12 %   Monocytes Absolute 0.8 0.1 - 1.0 K/uL   Eosinophils Relative 0 0 - 5 %   Eosinophils Absolute 0.0 0.0 - 0.7 K/uL   Basophils Relative 0 0 - 1 %   Basophils Absolute 0.0 0.0 - 0.1 K/uL  Lipase, blood     Status: Abnormal   Collection Time: 03/14/15  9:32 PM  Result Value Ref Range   Lipase 17 (L) 22 - 51 U/L  I-Stat CG4 Lactic Acid, ED     Status: Abnormal   Collection Time:  03/14/15  9:58 PM  Result Value Ref Range   Lactic Acid, Venous 3.63 (HH) 0.5 - 2.0 mmol/L   Comment NOTIFIED PHYSICIAN   Urinalysis, Routine w reflex microscopic     Status: Abnormal  Collection Time: 03/14/15 10:15 PM  Result Value Ref Range   Color, Urine YELLOW YELLOW   APPearance CLOUDY (A) CLEAR   Specific Gravity, Urine 1.017 1.005 - 1.030   pH 7.5 5.0 - 8.0   Glucose, UA NEGATIVE NEGATIVE mg/dL   Hgb urine dipstick LARGE (A) NEGATIVE   Bilirubin Urine NEGATIVE NEGATIVE   Ketones, ur NEGATIVE NEGATIVE mg/dL   Protein, ur NEGATIVE NEGATIVE mg/dL   Urobilinogen, UA 0.2 0.0 - 1.0 mg/dL   Nitrite NEGATIVE NEGATIVE   Leukocytes, UA MODERATE (A) NEGATIVE  Urine microscopic-add on     Status: Abnormal   Collection Time: 03/14/15 10:15 PM  Result Value Ref Range   Squamous Epithelial / LPF RARE RARE   WBC, UA 11-20 <3 WBC/hpf   RBC / HPF 7-10 <3 RBC/hpf   Bacteria, UA MANY (A) RARE   No results found for this or any previous visit (from the past 240 hour(s)).  Renal Function:  Recent Labs  03/14/15 2132  CREATININE 0.85   Estimated Creatinine Clearance: 35.7 mL/min (by C-G formula based on Cr of 0.85).  Radiologic Imaging: Ct Abdomen Pelvis Wo Contrast  03/14/2015   CLINICAL DATA:  Lower abdominal pain, nausea, vomiting and diarrhea for 1 day.  EXAM: CT ABDOMEN AND PELVIS WITHOUT CONTRAST  TECHNIQUE: Multidetector CT imaging of the abdomen and pelvis was performed following the standard protocol without IV contrast.  COMPARISON:  CT 07/28/2011  FINDINGS: Evaluation of the lung bases demonstrates cardiomegaly and coronary artery calcifications. Pacemaker is partially included. There is significant tortuosity of the distal descending thoracic aorta. Chronic changes noted at the lung bases with pleural-parenchymal scarring.  There is a moderate hiatal hernia, mildly progressed in size compared to prior exam. The unenhanced liver, gallbladder, spleen, and right adrenal gland are  normal. The left adrenal gland is suboptimally defined, there is soft tissue stranding in the left upper quadrant in the region of the adrenal gland. Pancreas is atrophic without surrounding inflammatory change.  There is a 4 mm obstructing stone at the left ureterovesicular junction with resultant mild hydronephrosis. Perinephric stranding noted about the left kidney. Additional punctate nonobstructing stones in the mid and lower left kidney. There is thinning of the renal parenchyma. Thinning of the right renal parenchyma without hydronephrosis. Kidneys are both inferiorly located and slightly ectatic in appearance due to scoliosis.  The stomach is decompressed. There are no dilated or thickened bowel loops. Small volume of stool throughout the colon. There are scattered colonic diverticula without diverticulitis. Detailed bowel evaluation is limited given mild patient motion.  No retroperitoneal adenopathy. Abdominal aorta is tortuous in its course with moderate atherosclerosis. No aneurysm. No retroperitoneal adenopathy. Small fat containing umbilical hernia. No free air, free fluid, or intra-abdominal fluid collection. Appendix is not identified.  Within the pelvis the urinary bladder is near completely decompressed. Uterus is surgically absent. No adnexal mass. No pelvic free fluid. No inguinal adenopathy. There is fat in both inguinal canals.  The bones are under mineralized. There is moderate scoliotic curvature of the thoracolumbar spine with associated degenerative change. Compression deformity of T11 is again seen. Degenerative change about both hips, pubic symphysis, and sacroiliac joints. There are no acute or suspicious osseous abnormalities.  IMPRESSION: 1. Obstructing 4 mm stone at the left ureterovesicular junction with resultant mild left hydronephrosis and perinephric stranding. Stranding extends to the region of the left adrenal gland. 2. Chronic findings include hiatal hernia, atherosclerosis,  small fat containing umbilical hernia, and chronic T11 compression  fracture.   Electronically Signed   By: Rubye Oaks M.D.   On: 03/14/2015 23:22    I independently reviewed the above imaging studies.  Impression/Assessment:  Left distal ureteral stone with infection  Plan:  I would recommend urgent cystoscopy, possible stone extraction/ureteral stenting. I discussed the urgent need for this with the patient and her daughter, Talbert Forest, who attends her this evening. We will look toward doing that in the near future this morning.  I've also asked the hospital service to provide assistance with medical management during her hospitalization.

## 2015-03-15 NOTE — Progress Notes (Signed)
Pt has been hypotensive since admission to the ICU/Stepdown.  Donnamarie PoagK. Kirby NP has been aware with orders received for 2- 500cc boluses.  Patient has received both boluses and SBP is still in the 70's. I had spoken to the daughter who is the primary caretaker and she said that her mother does not want to be placed on the ventilator nor does she want CPR. Will continue to monitor.

## 2015-03-15 NOTE — ED Notes (Signed)
Pt sent to OR, report given.

## 2015-03-15 NOTE — Transfer of Care (Signed)
Immediate Anesthesia Transfer of Care Note  Patient: Brenda Cook  Procedure(s) Performed: Procedure(s): CYSTOSCOPY WITH STENT PLACEMENT left ureter TTEMPTED STONE EXTRACTION with basket (Left)  Patient Location: PACU  Anesthesia Type:General  Level of Consciousness: awake and patient cooperative  Airway & Oxygen Therapy: Patient Spontanous Breathing and Patient connected to nasal cannula oxygen  Post-op Assessment: Report given to RN and Post -op Vital signs reviewed and stable  Post vital signs: Reviewed and stable  Last Vitals:  Filed Vitals:   03/15/15 0013  BP: 139/61  Pulse: 70  Temp:   Resp: 24    Complications: No apparent anesthesia complications

## 2015-03-15 NOTE — H&P (Signed)
[facility] History and Physical  Brenda Cook EAV:[id number] DOB: 1921/07/22 DOA: 03/14/2015  Referring physician: EDP PCP: [name], MD   Chief Complaint: Abdominal pain   HPI: Brenda Cook is a 81 y.o. female who presents to the ED with LLQ and left flank pain, N/V, multiple episodes of NBNB vomiting.  Small amount of diarrhea.  No fevers no dysuria, no chest pain, no SOB.  Symptoms are moderate, constant, unchanging, nothing makes better or worse.  W/U in ED finds sepsis with tachypnea, elevated WBC.  UA is c/w UTI.  CT abd/pelvis demonstrates obstructing stone L ureter.  Review of Systems: Systems reviewed.  As above, otherwise negative  Past Medical History  Diagnosis Date  . Hypertension   . Hypertensive cardiovascular disease   . Peripheral edema   . Chronic cough   . GERD (gastroesophageal reflux disease)   . Arthritis   . Atrial fibrillation   . Complete heart block     Has old Thera pacemaker on right side that is turned off and new MDT Versa unit on left  . Stroke    Past Surgical History  Procedure Laterality Date  . Partial hysterectomy    . Appendectomy    . Tonsillectomy and adenoidectomy    . Insert / replace / remove pacemaker  7/10  . Insert / replace / remove pacemaker  5/79  . Insert / replace / remove pacemaker  7/95  . Shoulder surgery    . Tendon repair  07/20/2012    Procedure: TENDON REPAIR;  Surgeon: Nicki Reaper, MD;  Location: [facility];  Service: Orthopedics;  Laterality: Left;  Reconstruction Extensor hood Left middle finger and left ring finger  . Repair extensor tendon Right 12/17/2012    Procedure: REPAIR EXTENSOR TENDON;  Surgeon: Nicki Reaper, MD;  Location: [facility];  Service: Orthopedics;  Laterality: Right;  CENTRALIZATION EXTENSOR TENDON RIGHT RING FINGER/RIGHT SMALL FINGER ANESTHESIA   Social History:  reports that she has never smoked. She does not have any smokeless tobacco history  on file. She reports that she does not drink alcohol or use illicit drugs.  Allergies  Allergen Reactions  . Depakote [Valproic Acid] Other (See Comments)    disorientation  . Benicar [Olmesartan Medoxomil] Other (See Comments)    Not effective    Family History  Problem Relation Age of Onset  . Stroke Father   . Heart failure Mother   . Leukemia Sister      Prior to Admission medications   Medication Sig Start Date End Date Taking? Authorizing Provider  aspirin EC 81 MG tablet Take 81 mg by mouth daily with breakfast.    Yes Historical Provider, MD  b complex vitamins tablet Take 1 tablet by mouth at bedtime.    Yes Historical Provider, MD  Bioflavonoid Products (ESTER-C) 500-200-60 MG TABS Take 1 tablet by mouth at bedtime.    Yes Historical Provider, MD  Calcium Carb-Cholecalciferol (CALCIUM 600+D) 600-800 MG-UNIT TABS Take 1 tablet by mouth 2 (two) times daily.   Yes Historical Provider, MD  Cholecalciferol (VITAMIN D-3) 5000 UNITS TABS Take 1 tablet by mouth daily with breakfast.    Yes Historical Provider, MD  diphenhydrAMINE (SOMINEX) 25 MG tablet Take 25 mg by mouth at bedtime as needed for sleep.   Yes Historical Provider, MD  Flaxseed, Linseed, (FLAXSEED OIL PO) Take 2,400 mg by mouth 2 (two) times daily.   Yes Historical Provider, MD  hydrochlorothiazide (HYDRODIURIL) 25  MG tablet  03/09/15  Yes Historical Provider, MD  metoprolol succinate (TOPROL-XL) 25 MG 24 hr tablet TAKE ONE-HALF (1/2) TABLET DAILY 07/18/14  Yes Hillis RangeJames Allred, MD  Multiple Vitamin (MULTIVITAMIN WITH MINERALS) TABS Take 1 tablet by mouth at bedtime.    Yes Historical Provider, MD  Multiple Vitamins-Minerals (ICAPS PO) Take 1 capsule by mouth daily at 2 PM daily at 2 PM.   Yes Historical Provider, MD  Omega-3 Fatty Acids (FISH OIL PO) Take 1,290 mg by mouth 2 (two) times daily.   Yes Historical Provider, MD  pantoprazole (PROTONIX) 40 MG tablet Take 40 mg by mouth daily with breakfast.    Yes Historical  Provider, MD  Polyethyl Glycol-Propyl Glycol (SYSTANE OP) Place 1 drop into both eyes 2 (two) times daily.    Yes Historical Provider, MD  potassium chloride SA (K-DUR,KLOR-CON) 20 MEQ tablet Take 20 mEq by mouth daily. 01/29/15  Yes Historical Provider, MD  sertraline (ZOLOFT) 100 MG tablet Take 100 mg by mouth at bedtime.    Yes Historical Provider, MD   Physical Exam: Filed Vitals:   03/15/15 0013  BP: 139/61  Pulse: 70  Temp:   Resp: 24    BP 139/61 mmHg  Pulse 70  Temp(Src) 97.7 F (36.5 C) (Oral)  Resp 24  SpO2 95%  General Appearance:    Alert, oriented, no distress, appears stated age  Head:    Normocephalic, atraumatic  Eyes:    PERRL, EOMI, sclera non-icteric        Nose:   Nares without drainage or epistaxis. Mucosa, turbinates normal  Throat:   Moist mucous membranes. Oropharynx without erythema or exudate.  Neck:   Supple. No carotid bruits.  No thyromegaly.  No lymphadenopathy.   Back:     No CVA tenderness, no spinal tenderness  Lungs:     Clear to auscultation bilaterally, without wheezes, rhonchi or rales  Chest wall:    No tenderness to palpitation  Heart:    Regular rate and rhythm without murmurs, gallops, rubs  Abdomen:     Soft, non-tender, nondistended, normal bowel sounds, no organomegaly  Genitalia:    deferred  Rectal:    deferred  Extremities:   No clubbing, cyanosis or edema.  Pulses:   2+ and symmetric all extremities  Skin:   Skin color, texture, turgor normal, no rashes or lesions  Lymph nodes:   Cervical, supraclavicular, and axillary nodes normal  Neurologic:   CNII-XII intact. Normal strength, sensation and reflexes      throughout    Labs on Admission:  Basic Metabolic Panel:  Recent Labs Lab 03/14/15 2132  NA 139  K 3.5  CL 102  CO2 29  GLUCOSE 162*  BUN 28*  CREATININE 0.85  CALCIUM 10.2   Liver Function Tests:  Recent Labs Lab 03/14/15 2132  AST 31  ALT 24  ALKPHOS 108  BILITOT 0.6  PROT 7.7  ALBUMIN 4.0     Recent Labs Lab 03/14/15 2132  LIPASE 17*   No results for input(s): AMMONIA in the last 168 hours. CBC:  Recent Labs Lab 03/14/15 2132  WBC 16.9*  NEUTROABS 15.7*  HGB 11.8*  HCT 37.8  MCV 80.9  PLT 232   Cardiac Enzymes: No results for input(s): CKTOTAL, CKMB, CKMBINDEX, TROPONINI in the last 168 hours.  BNP (last 3 results) No results for input(s): PROBNP in the last 8760 hours. CBG: No results for input(s): GLUCAP in the last 168 hours.  Radiological Exams on Admission:  Ct Abdomen Pelvis Wo Contrast  03/14/2015   CLINICAL DATA:  Lower abdominal pain, nausea, vomiting and diarrhea for 1 day.  EXAM: CT ABDOMEN AND PELVIS WITHOUT CONTRAST  TECHNIQUE: Multidetector CT imaging of the abdomen and pelvis was performed following the standard protocol without IV contrast.  COMPARISON:  CT 07/28/2011  FINDINGS: Evaluation of the lung bases demonstrates cardiomegaly and coronary artery calcifications. Pacemaker is partially included. There is significant tortuosity of the distal descending thoracic aorta. Chronic changes noted at the lung bases with pleural-parenchymal scarring.  There is a moderate hiatal hernia, mildly progressed in size compared to prior exam. The unenhanced liver, gallbladder, spleen, and right adrenal gland are normal. The left adrenal gland is suboptimally defined, there is soft tissue stranding in the left upper quadrant in the region of the adrenal gland. Pancreas is atrophic without surrounding inflammatory change.  There is a 4 mm obstructing stone at the left ureterovesicular junction with resultant mild hydronephrosis. Perinephric stranding noted about the left kidney. Additional punctate nonobstructing stones in the mid and lower left kidney. There is thinning of the renal parenchyma. Thinning of the right renal parenchyma without hydronephrosis. Kidneys are both inferiorly located and slightly ectatic in appearance due to scoliosis.  The stomach is  decompressed. There are no dilated or thickened bowel loops. Small volume of stool throughout the colon. There are scattered colonic diverticula without diverticulitis. Detailed bowel evaluation is limited given mild patient motion.  No retroperitoneal adenopathy. Abdominal aorta is tortuous in its course with moderate atherosclerosis. No aneurysm. No retroperitoneal adenopathy. Small fat containing umbilical hernia. No free air, free fluid, or intra-abdominal fluid collection. Appendix is not identified.  Within the pelvis the urinary bladder is near completely decompressed. Uterus is surgically absent. No adnexal mass. No pelvic free fluid. No inguinal adenopathy. There is fat in both inguinal canals.  The bones are under mineralized. There is moderate scoliotic curvature of the thoracolumbar spine with associated degenerative change. Compression deformity of T11 is again seen. Degenerative change about both hips, pubic symphysis, and sacroiliac joints. There are no acute or suspicious osseous abnormalities.  IMPRESSION: 1. Obstructing 4 mm stone at the left ureterovesicular junction with resultant mild left hydronephrosis and perinephric stranding. Stranding extends to the region of the left adrenal gland. 2. Chronic findings include hiatal hernia, atherosclerosis, small fat containing umbilical hernia, and chronic T11 compression fracture.   Electronically Signed   By: Rubye OaksMelanie  Ehinger M.D.   On: 03/14/2015 23:22    EKG: Independently reviewed.  Assessment/Plan Principal Problem:   Pyohydronephrosis Active Problems:   Essential hypertension   Sepsis secondary to UTI   Left ureteral stone   1. Pyohydronephrosis - UTI with L ureteral stone 1. Empiric rocephin 2. Cultures pending 3. Dr. Rica Motehalstedt taking patient to OR to get urgent decompression with ureteral stent 4. To SDU post-op 5. Holding home BP meds 6. Gentle hydration with IVF, if patient becomes more septic with tachycardia or  hypotension will need more aggressive fluid resuscitation 2. HTN - holding BP meds as per above.    Dr. Rica Motehalstedt in ED and taking patient to OR now.  Code Status: Full Code  Family Communication: Family at bedside Disposition Plan: Admit to SDU   Time spent: 70 min  Beaulah Romanek M. [facility] Pager [phone]  If 7AM-7PM, please contact the day team taking care of the patient [email or web address] Password [email or web address] 03/15/2015, 12:31 AM

## 2015-03-15 NOTE — Care Management Note (Signed)
Case Management Note  Patient Details  Name: Romilda GarretHelen C Dalsanto MRN: [id number] Date of Birth: 01-24-1921  Subjective/Objective:            Hypotensive post op        Action/Plan: Home when stable   Expected Discharge Date:  03/19/15               Expected Discharge Plan:  Home/Self Care  In-House Referral:  NA  Discharge planning Services  CM Consult  Post Acute Care Choice:  NA Choice offered to:  NA  DME Arranged:    DME Agency:     HH Arranged:    HH Agency:     Status of Service:     Medicare Important Message Given:    Date Medicare IM Given:    Medicare IM give by:    Date Additional Medicare IM Given:    Additional Medicare Important Message give by:     If discussed at Long Length of Stay Meetings, dates discussed:    Additional Comments:  Golda AcreDavis, Jamella Grayer Lynn, RN 03/15/2015, 9:15 AM

## 2015-03-15 NOTE — Progress Notes (Signed)
ANTIBIOTIC CONSULT NOTE - INITIAL  Pharmacy Consult for Vancomycin, Cefepime Indication: rule out sepsis  Allergies  Allergen Reactions  . Depakote [Valproic Acid] Other (See Comments)    disorientation  . Benicar [Olmesartan Medoxomil] Other (See Comments)    Not effective    Patient Measurements: Height: 5\' 3"  (160 cm) Weight: 130 lb 11.7 oz (59.3 kg) IBW/kg (Calculated) : 52.4  Vital Signs: Temp: 98 F (36.7 C) (05/19 0800) Temp Source: Oral (05/19 0800) BP: 77/42 mmHg (05/19 0945) Pulse Rate: 74 (05/19 0945) Intake/Output from previous day: 05/18 0701 - 05/19 0700 In: 1574.7 [I.V.:1074.7; IV Piggyback:500] Out: 2 [Urine:2] Intake/Output from this shift: Total I/O In: 800 [I.V.:800] Out: -   Labs:  Recent Labs  03/14/15 2132 03/15/15 0640  WBC 16.9* 16.3*  HGB 11.8* 9.6*  PLT 232 162  CREATININE 0.85 0.88   Estimated Creatinine Clearance: 34.4 mL/min (by C-G formula based on Cr of 0.88). No results for input(s): VANCOTROUGH, VANCOPEAK, VANCORANDOM, GENTTROUGH, GENTPEAK, GENTRANDOM, TOBRATROUGH, TOBRAPEAK, TOBRARND, AMIKACINPEAK, AMIKACINTROU, AMIKACIN in the last 72 hours.   Microbiology: Recent Results (from the past 720 hour(s))  MRSA PCR Screening     Status: None   Collection Time: 03/15/15  3:22 AM  Result Value Ref Range Status   MRSA by PCR NEGATIVE NEGATIVE Final    Comment:        The GeneXpert MRSA Assay (FDA approved for NASAL specimens only), is one component of a comprehensive MRSA colonization surveillance program. It is not intended to diagnose MRSA infection nor to guide or monitor treatment for MRSA infections.     Medical History: Past Medical History  Diagnosis Date  . Hypertension   . Hypertensive cardiovascular disease   . Peripheral edema   . Chronic cough   . GERD (gastroesophageal reflux disease)   . Arthritis   . Atrial fibrillation   . Complete heart block     Has old Thera pacemaker on right side that is turned  off and new MDT Versa unit on left  . Stroke     Assessment: [age] y/oF with PMH of HTN, edema, a-fib, stroke, CHB, GERD who [facility] with LLQ and L flank pain, n/v, and diarrhea. Patient found to be septic in ED with tachypnea, leukocytosis, and UA c/w UTI. CT abd/elvis showed obstructing L ureter stone and pt taken to OR for cystoscopy and L stent placement. Patient hypotensive post-op. Patient initially placed on Ceftriaxone for sepsis 2/2 UTI, now broadened to Cefepime and Vancomycin, with pharmacy requested to assist with dosing.  5/18 >> ceftriaxone >> 5/19 5/19 >> vancomycin >> 5/19 >> cefepime >>    5/18 urine: sent 5/19  MRSA PCR: negative   SCr 0.88, CrCl~ 34 ml/min CG WBC elevated Tmax 100.1 LA 3.63 > 2.43  Goal of Therapy:  Vancomycin trough level 15-20 mcg/ml  Appropriate antibiotic dosing for renal function and indication Eradication of infection  Plan:   Vancomycin 1g IV x 1, then 750mg  IV q24h.  Plan for Vancomycin trough level at steady state.  Cefepime 1g IV q12h.  Monitor renal function, cultures, clinical course.   Greer PickerelJigna Manasa Spease, PharmD, BCPS Pager: [phone] 03/15/2015 10:18 AM

## 2015-03-15 NOTE — Anesthesia Preprocedure Evaluation (Signed)
Anesthesia Evaluation  Patient identified by MRN, date of birth, ID band Patient awake    Reviewed: Allergy & Precautions, NPO status , Patient's Chart, lab work & pertinent test results  History of Anesthesia Complications Negative for: history of anesthetic complications  Airway Mallampati: III  TM Distance: >3 FB Neck ROM: Full    Dental no notable dental hx. (+) Dental Advisory Given, Poor Dentition   Pulmonary neg pulmonary ROS,  breath sounds clear to auscultation  Pulmonary exam normal       Cardiovascular hypertension, Normal cardiovascular exam+ dysrhythmias (hx of complete heart block, sp pacemaker) Rhythm:Regular Rate:Normal     Neuro/Psych CVA, Residual Symptoms negative psych ROS   GI/Hepatic Neg liver ROS, GERD-  Medicated and Controlled,  Endo/Other  negative endocrine ROS  Renal/GU Renal disease  negative genitourinary   Musculoskeletal  (+) Arthritis -,   Abdominal   Peds negative pediatric ROS (+)  Hematology negative hematology ROS (+)   Anesthesia Other Findings   Reproductive/Obstetrics negative OB ROS                             Anesthesia Physical Anesthesia Plan  ASA: III and emergent  Anesthesia Plan: General   Post-op Pain Management:    Induction: Intravenous, Rapid sequence and Cricoid pressure planned  Airway Management Planned: Oral ETT  Additional Equipment:   Intra-op Plan:   Post-operative Plan: Extubation in OR and Possible Post-op intubation/ventilation  Informed Consent:   Dental advisory given  Plan Discussed with:   Anesthesia Plan Comments:         Anesthesia Quick Evaluation

## 2015-03-16 ENCOUNTER — Encounter (HOSPITAL_COMMUNITY): Payer: Self-pay | Admitting: Urology

## 2015-03-16 LAB — BASIC METABOLIC PANEL
Anion gap: 7 (ref 5–15)
BUN: 32 mg/dL — ABNORMAL HIGH (ref 6–20)
CALCIUM: 9.2 mg/dL (ref 8.9–10.3)
CO2: 23 mmol/L (ref 22–32)
Chloride: 110 mmol/L (ref 101–111)
Creatinine, Ser: 0.79 mg/dL (ref 0.44–1.00)
GFR calc Af Amer: >60 mL/min (ref >60)
GFR calc non Af Amer: >60 mL/min (ref >60)
Glucose, Bld: 136 mg/dL — ABNORMAL HIGH (ref 65–99)
POTASSIUM: 4 mmol/L (ref 3.5–5.1)
SODIUM: 140 mmol/L (ref 135–145)

## 2015-03-16 LAB — CBC
HCT: 27.6 % — ABNORMAL LOW (ref 36.0–46.0)
Hemoglobin: 8.7 g/dL — ABNORMAL LOW (ref 12.0–15.0)
MCH: 25.9 pg — ABNORMAL LOW (ref 26.0–34.0)
MCHC: 31.5 g/dL (ref 30.0–36.0)
MCV: 82.1 fL (ref 78.0–100.0)
Platelets: 146 10*3/uL — ABNORMAL LOW (ref 150–400)
RBC: 3.36 MIL/uL — ABNORMAL LOW (ref 3.87–5.11)
RDW: 15.8 % — AB (ref 11.5–15.5)
WBC: 14.8 10*3/uL — AB (ref 4.0–10.5)

## 2015-03-16 MED ORDER — DEXTROSE 5 % IV SOLN
1.0000 g | INTRAVENOUS | Status: DC
  Administered 2015-03-17 – 2015-03-18 (×2): 1 g via INTRAVENOUS
  Filled 2015-03-16 (×2): qty 1

## 2015-03-16 MED ORDER — METOPROLOL SUCCINATE 12.5 MG HALF TABLET
12.5000 mg | ORAL_TABLET | Freq: Every day | ORAL | Status: DC
  Administered 2015-03-16 – 2015-03-25 (×10): 12.5 mg via ORAL
  Filled 2015-03-16 (×10): qty 1

## 2015-03-16 NOTE — Clinical Documentation Improvement (Signed)
Per 5/18 Ed Note "Pt on 2 L home oxygen". Per 5/18 Progress Note: "Patient wears oxygen at [facility] and per 5/19 Consult note: "She is oxygen dependent"  Please specify the condition necessitating this patient's need for supplemental oxygen.  Thank You,  Alesia RichardsAnne Jermani Pund, RN CDS [facility] Information Management [email or web address] [phone]

## 2015-03-16 NOTE — Evaluation (Signed)
Physical Therapy Evaluation Patient Details Name: Brenda Cook MRN: [id number] DOB: 12/08/1920 Today's Date: 03/16/2015   History of Present Illness  Brenda Cook is a 81 y.o. female who presents to the ED with LLQ and left flank pain, N/V;  PMHx:  HTN, afib, pacemaker  Clinical Impression  Patient evaluated by Physical Therapy with no further acute skilled PT needs identified.  See below for any follow-up Physial Therapy or equipment needs. PT is signing off. Thank you for this referral. Pt is at her baseline for functional mobility;     Follow Up Recommendations No PT follow up    Equipment Recommendations  None recommended by PT    Recommendations for Other Services       Precautions / Restrictions Precautions Precautions: Fall Restrictions Weight Bearing Restrictions: No      Mobility  Bed Mobility Overal bed mobility: Needs Assistance Bed Mobility: Supine to Sit     Supine to sit: Mod assist     General bed mobility comments:  requires incr time and multi-modal cues for task completion and technique  Transfers Overall transfer level: Needs assistance   Transfers: Squat Pivot Transfers     Squat pivot transfers: +2 physical assistance;Total assist     General transfer comment: bed pad used to scoot pt to chair;   Ambulation/Gait                Stairs            Wheelchair Mobility    Modified Rankin (Stroke Patients Only)       Balance Overall balance assessment: Needs assistance Sitting-balance support: No upper extremity supported;Feet unsupported Sitting balance-Leahy Scale: Fair Sitting balance - Comments: pt able to maintain static sit  with supervision for brief periods                                     Pertinent Vitals/Pain Pain Assessment: No/denies pain    Home Living Family/patient expects to be discharged to:: Private residence Living Arrangements: Children                    Prior  Function Level of Independence: Needs assistance   Gait / Transfers Assistance Needed: pt non amb at home per her report; pt reports that her dtr adn son-in-law "pick me up and put me in chair"   ADL's / Homemaking Assistance Needed: per pt her dtr bathes, feeds her; she states she uses bed pan for toileting        Hand Dominance        Extremity/Trunk Assessment   Upper Extremity Assessment: Generalized weakness           Lower Extremity Assessment: Generalized weakness         Communication   Communication: No difficulties  Cognition Arousal/Alertness: Awake/alert Behavior During Therapy: WFL for tasks assessed/performed Overall Cognitive Status: Within Functional Limits for tasks assessed                      General Comments      Exercises        Assessment/Plan    PT Assessment Patent does not need any further PT services  PT Diagnosis Generalized weakness   PT Problem List    PT Treatment Interventions     PT Goals (Current goals can be found in the Care Plan section) Acute Rehab  PT Goals Patient Stated Goal: none stated    Frequency     Barriers to discharge        Co-evaluation               End of Session Equipment Utilized During Treatment: Gait belt Activity Tolerance: Patient tolerated treatment well Patient left: in chair;with call bell/phone within reach;Other (comment) (on lift pad) Nurse Communication: Need for lift equipment         Time: [id number] PT Time Calculation (min) (ACUTE ONLY): 19 min   Charges:   PT Evaluation $Initial PT Evaluation Tier I: 1 Procedure     PT G CodesDrucilla Chalet:        Janeva Peaster 03/16/2015, 11:36 AM

## 2015-03-16 NOTE — Progress Notes (Signed)
Patient transferred from ICU stepdown at around 1553. Alert,oriented to self. Reorient to environment. Slow to respond. Denies pain. Placed comfortably in bed. Will continue to monitor the patient.

## 2015-03-16 NOTE — Progress Notes (Signed)
PROGRESS NOTE    Brenda GarretHelen C Brenda Cook ZOX:[id number] DOB: [birth year] DOA: 03/14/2015 PCP: Thora LanceEHINGER,ROBERT R, MD  HPI/Brief narrative 81 y.o. female who presents to the ED with LLQ and left flank pain, N/V, multiple episodes of NBNB vomiting. Small amount of diarrhea. No fevers no dysuria, no chest pain, no SOB. Symptoms are moderate, constant, unchanging, nothing makes better or worse.  W/U in ED finds sepsis with tachypnea, elevated WBC. UA is c/w UTI. CT abd/pelvis demonstrates obstructing stone L ureter.  Assessment/Plan:  Severe sepsis, present on admission-secondary to GNR acute pyelonephritis/infected left distal ureteral stone - Admitted to stepdown unit - Hydrated aggressively with IV fluids, initially started on Rocephin which was changed to IV vancomycin and cefepime pending culture results - On 5/19, Despite IV fluid boluses, blood pressures are soft in the 90s (as per family, patient probably has chronic low blood pressures).  - Checking random cortisol: 21 and started stress dose IV hydrocortisone - DC'ed 5/20 - Although chest x-ray is read as possible CHF, clinically patient is volume depleted and not in CHF - Improving - UCx GNR. DC Vancomycin.  GNR Acute pyelonephritis secondary to obstructing/infected left distal ureteral stone with left hydronephrosis  - Urology was consulted and patient underwent urgent cystoscopy and left double-J stent placement on 5/19  - Antibiotic management as above  - As per urology, patient will eventually need anesthetic ureteroscopy once her medical situation is stabilized and her infection treated.   Hypokalemia - Replaced  Anemia - Slight drop probably d/t dilution or acute illness - FU CBC in am and transfuse if Hb < 7.  Essential hypertension/hypotension - Holding antihypertensives (HCTZ ). - Hypotension resolved with IVF alone.  Paroxysmal A. fib/permanent pacemaker - AV paced rhythm on telemetry - Resume beta blockers when  blood pressure allows - Probably not a candidate for anticoagulation due to advanced age. Continue aspirin - CHA2DS2-VASc score: 6/high risk - resumed low dose BB 5/20  GERD - Continue PPI  Macular degeneration/legally blind and hearing impairment   Code Status: Limited code: No CPR/defibrillation/intubation/BiPAP/central line. Family Communication: On 5/19, Discussed extensively on multiple occasions with patient's daughter/healthcare power of attorney Ms. Mickie HillierShirley Cook. Advised her regarding patient's critical illness. Disposition Plan: Transfer to tele 5/20   Consultants:  Urology   Procedures:  urgent cystoscopy and left double-J stent placement on 5/19   Antibiotics:  IV Rocephin 1 dose on 5/18   IV cefepime 5/19 >   IV vancomycin 5/19 >5/20  Subjective: Denied complaints. As per nursing this morning, doing well without acute events overnight.  Objective: Filed Vitals:   03/16/15 1000 03/16/15 1200 03/16/15 1400 03/16/15 1550  BP: 121/54 127/56 123/52 116/53  Pulse: 72 69 73 71  Temp:  98.3 F (36.8 C)  98.1 F (36.7 C)  TempSrc:  Oral  Oral  Resp: 16 14 21 16   Height:      Weight:      SpO2: 97% 98% 97% 97%    Intake/Output Summary (Last 24 hours) at 03/16/15 1659 Last data filed at 03/16/15 0600  Gross per 24 hour  Intake   1400 ml  Output      0 ml  Net   1400 ml   Filed Weights   03/15/15 0321  Weight: 59.3 kg (130 lb 11.7 oz)     Exam:  General exam: Moderately built and thinly nourished pleasant elderly female lying comfortably supine in bed.  Respiratory system: occasional basal crackles but otherwise clear to auscultation. No  increased work of breathing. Cardiovascular system: S1 & S2 heard, RRR. No JVD, murmurs, gallops, clicks or pedal edema. Telemetry: AV paced rhythm.  Gastrointestinal system: Abdomen is nondistended, soft and nontender. Normal bowel sounds heard. Central nervous system: Alert and oriented 2. No focal  neurological deficits. Decreased visual acuity and hard of hearing  Extremities: Symmetric 5 x 5 power.   Data Reviewed: Basic Metabolic Panel:  Recent Labs Lab 03/14/15 2132 03/15/15 0640 03/16/15 0325  NA 139 141 140  K 3.5 2.8* 4.0  CL 102 104 110  CO2 GLUCOSE 162* 143* 136*  BUN 28* 28* 32*  CREATININE 0.85 0.88 0.79  CALCIUM 10.2 9.1 9.2   Liver Function Tests:  Recent Labs Lab 03/14/15 2132  AST 31  ALT 24  ALKPHOS 108  BILITOT 0.6  PROT 7.7  ALBUMIN 4.0    Recent Labs Lab 03/14/15 2132  LIPASE 17*   No results for input(s): AMMONIA in the last 168 hours. CBC:  Recent Labs Lab 03/14/15 2132 03/15/15 0640 03/16/15 0325  WBC 16.9* 16.3* 14.8*  NEUTROABS 15.7*  --   --   HGB 11.8* 9.6* 8.7*  HCT 37.8 30.8* 27.6*  MCV 80.9 80.6 82.1  PLT 232 162 146*   Cardiac Enzymes: No results for input(s): CKTOTAL, CKMB, CKMBINDEX, TROPONINI in the last 168 hours. BNP (last 3 results) No results for input(s): PROBNP in the last 8760 hours. CBG: No results for input(s): GLUCAP in the last 168 hours.  Recent Results (from the past 240 hour(s))  Urine culture     Status: None (Preliminary result)   Collection Time: 03/14/15 10:15 PM  Result Value Ref Range Status   Specimen Description URINE, CATHETERIZED  Final   Special Requests NONE  Final   Colony Count   Final    >=100,000 COLONIES/ML Performed at [facility]    Culture   Final    GRAM NEGATIVE RODS Performed at [facility]    Report Status PENDING  Incomplete  MRSA PCR Screening     Status: None   Collection Time: 03/15/15  3:22 AM  Result Value Ref Range Status   MRSA by PCR NEGATIVE NEGATIVE Final    Comment:        The GeneXpert MRSA Assay (FDA approved for NASAL specimens only), is one component of a comprehensive MRSA colonization surveillance program. It is not intended to diagnose MRSA infection nor to guide or monitor treatment for MRSA infections.             Studies: Ct Abdomen Pelvis Wo Contrast  03/14/2015   CLINICAL DATA:  Lower abdominal pain, nausea, vomiting and diarrhea for 1 day.  EXAM: CT ABDOMEN AND PELVIS WITHOUT CONTRAST  TECHNIQUE: Multidetector CT imaging of the abdomen and pelvis was performed following the standard protocol without IV contrast.  COMPARISON:  CT 07/28/2011  FINDINGS: Evaluation of the lung bases demonstrates cardiomegaly and coronary artery calcifications. Pacemaker is partially included. There is significant tortuosity of the distal descending thoracic aorta. Chronic changes noted at the lung bases with pleural-parenchymal scarring.  There is a moderate hiatal hernia, mildly progressed in size compared to prior exam. The unenhanced liver, gallbladder, spleen, and right adrenal gland are normal. The left adrenal gland is suboptimally defined, there is soft tissue stranding in the left upper quadrant in the region of the adrenal gland. Pancreas is atrophic without surrounding inflammatory change.  There is a 4 mm obstructing stone at the left  ureterovesicular junction with resultant mild hydronephrosis. Perinephric stranding noted about the left kidney. Additional punctate nonobstructing stones in the mid and lower left kidney. There is thinning of the renal parenchyma. Thinning of the right renal parenchyma without hydronephrosis. Kidneys are both inferiorly located and slightly ectatic in appearance due to scoliosis.  The stomach is decompressed. There are no dilated or thickened bowel loops. Small volume of stool throughout the colon. There are scattered colonic diverticula without diverticulitis. Detailed bowel evaluation is limited given mild patient motion.  No retroperitoneal adenopathy. Abdominal aorta is tortuous in its course with moderate atherosclerosis. No aneurysm. No retroperitoneal adenopathy. Small fat containing umbilical hernia. No free air, free fluid, or intra-abdominal fluid collection. Appendix is  not identified.  Within the pelvis the urinary bladder is near completely decompressed. Uterus is surgically absent. No adnexal mass. No pelvic free fluid. No inguinal adenopathy. There is fat in both inguinal canals.  The bones are under mineralized. There is moderate scoliotic curvature of the thoracolumbar spine with associated degenerative change. Compression deformity of T11 is again seen. Degenerative change about both hips, pubic symphysis, and sacroiliac joints. There are no acute or suspicious osseous abnormalities.  IMPRESSION: 1. Obstructing 4 mm stone at the left ureterovesicular junction with resultant mild left hydronephrosis and perinephric stranding. Stranding extends to the region of the left adrenal gland. 2. Chronic findings include hiatal hernia, atherosclerosis, small fat containing umbilical hernia, and chronic T11 compression fracture.   Electronically Signed   By: Rubye OaksMelanie  Ehinger M.D.   On: 03/14/2015 23:22   Dg Chest Port 1 View  03/15/2015   CLINICAL DATA:  CHF.  Vomiting.  Upper abdominal pain.  EXAM: PORTABLE CHEST - 1 VIEW  COMPARISON:  10/04/2014  FINDINGS: The patient is rotated. Lower lung volumes from prior exam. Enlargement of the cardiac silhouette with large hiatal hernia. Left-sided pacemaker overlying the chest wall with intact leads. Right-sided pacemaker with disruption of inferior lead, unchanged. Increased interstitial opacity from prior suspicious for pulmonary edema. No pleural effusion. Probable fibrotic change at the lung bases.  IMPRESSION: Interstitial opacities, suspect pulmonary edema. Low lung volumes with cardiomegaly. Findings may reflect CHF.   Electronically Signed   By: Rubye OaksMelanie  Ehinger M.D.   On: 03/15/2015 01:17        Scheduled Meds: . aspirin EC  81 mg Oral Q breakfast  . [START ON 03/17/2015] ceFEPime (MAXIPIME) IV  1 g Intravenous Q24H  . heparin  5,000 Units Subcutaneous 3 times per day  . metoprolol succinate  12.5 mg Oral Daily  .  pantoprazole  40 mg Oral Q breakfast  . polyvinyl alcohol   Both Eyes BID  . sertraline  100 mg Oral QHS  . sodium chloride  3 mL Intravenous Q12H   Continuous Infusions:    Principal Problem:   Pyohydronephrosis Active Problems:   Essential hypertension   Sepsis secondary to UTI   Left ureteral stone   Severe sepsis    Time spent:35 minutes     HONGALGI,ANAND, MD, FACP, FHM. [facility] Pager [phone]  If 7PM-7AM, please contact night-coverage [email or web address] Password [email or web address] 03/16/2015, 4:59 PM    LOS: 2 days

## 2015-03-16 NOTE — Progress Notes (Signed)
Pharmacy: Re- cefepime  Patient's a 81 y.o F s/p urgent stenting for infected left distal ureteral stone on 5/19 and now with plan for ureteroscopy once she is more stable.  Cefepime and vancomycin started yesterday for broad coverage.  Ucx now back with GNR with vancomycin d/ced this morning per MD.  Scr 0.79 (crcl~38)  5/18 >> ceftriaxone >> 5/19 5/19 >> vancomycin >>5/20 5/19 >> cefepime >>    5/18 urine: > 100K GNR (sens pending) 5/19  MRSA PCR: negative    Plan: - will reduce cefepime to 1gm IV q24h for renal function - f/u with cultures  Dorna LeitzAnh Williemae Muriel, PharmD, BCPS 03/16/2015 1:57 PM

## 2015-03-17 DIAGNOSIS — E876 Hypokalemia: Secondary | ICD-10-CM

## 2015-03-17 LAB — URINE CULTURE

## 2015-03-17 LAB — BASIC METABOLIC PANEL
Anion gap: 9 (ref 5–15)
BUN: 26 mg/dL — ABNORMAL HIGH (ref 6–20)
CALCIUM: 9.1 mg/dL (ref 8.9–10.3)
CHLORIDE: 105 mmol/L (ref 101–111)
CO2: 24 mmol/L (ref 22–32)
Creatinine, Ser: 0.64 mg/dL (ref 0.44–1.00)
GFR calc Af Amer: >60 mL/min (ref >60)
GFR calc non Af Amer: >60 mL/min (ref >60)
GLUCOSE: 133 mg/dL — AB (ref 65–99)
Potassium: 3.1 mmol/L — ABNORMAL LOW (ref 3.5–5.1)
SODIUM: 138 mmol/L (ref 135–145)

## 2015-03-17 LAB — CBC
HCT: 28 % — ABNORMAL LOW (ref 36.0–46.0)
Hemoglobin: 8.6 g/dL — ABNORMAL LOW (ref 12.0–15.0)
MCH: 24.9 pg — ABNORMAL LOW (ref 26.0–34.0)
MCHC: 30.7 g/dL (ref 30.0–36.0)
MCV: 80.9 fL (ref 78.0–100.0)
Platelets: 139 10*3/uL — ABNORMAL LOW (ref 150–400)
RBC: 3.46 MIL/uL — AB (ref 3.87–5.11)
RDW: 15.6 % — ABNORMAL HIGH (ref 11.5–15.5)
WBC: 10.7 10*3/uL — ABNORMAL HIGH (ref 4.0–10.5)

## 2015-03-17 MED ORDER — POTASSIUM CHLORIDE CRYS ER 20 MEQ PO TBCR
40.0000 meq | EXTENDED_RELEASE_TABLET | Freq: Once | ORAL | Status: AC
  Administered 2015-03-17: 40 meq via ORAL
  Filled 2015-03-17: qty 2

## 2015-03-17 NOTE — Anesthesia Postprocedure Evaluation (Signed)
  Anesthesia Post-op Note  Patient: Brenda Cook  Procedure(s) Performed: Procedure(s) (LRB): CYSTOSCOPY WITH STENT PLACEMENT left ureter TTEMPTED STONE EXTRACTION with basket (Left)  Patient Location: PACU  Anesthesia Type: gen  Level of Consciousness: awake and alert   Airway and Oxygen Therapy: Patient Spontanous Breathing  Post-op Pain: mild  Post-op Assessment: Post-op Vital signs reviewed, Patient's Cardiovascular Status Stable, Respiratory Function Stable, Patent Airway and No signs of Nausea or vomiting  Last Vitals:  Filed Vitals:   03/17/15 0510  BP: 135/62  Pulse: 70  Temp: 37.2 C  Resp: 18    Post-op Vital Signs: stable   Complications: No apparent anesthesia complications

## 2015-03-17 NOTE — Progress Notes (Signed)
PROGRESS NOTE    ARISBETH PURRINGTON ZOX:[id number] DOB: 1921/08/02 DOA: 03/14/2015 PCP: [name], MD  HPI/Brief narrative 81 y.o. female who presents to the ED with LLQ and left flank pain, N/V, multiple episodes of NBNB vomiting. Small amount of diarrhea. No fevers no dysuria, no chest pain, no SOB. Symptoms are moderate, constant, unchanging, nothing makes better or worse.  W/U in ED finds sepsis with tachypnea, elevated WBC. UA is c/w UTI. CT abd/pelvis demonstrates obstructing stone L ureter.  Assessment/Plan:  Severe sepsis, present on admission-secondary to GNR acute pyelonephritis/infected left distal ureteral stone - Admitted to stepdown unit - Hydrated aggressively with IV fluids, initially started on Rocephin which was changed to IV vancomycin and cefepime pending culture results - On 5/19, Despite IV fluid boluses, blood pressures are soft in the 90s (as per family, patient probably has chronic low blood pressures).  - Checking random cortisol: 21 and started stress dose IV hydrocortisone - DC'ed 5/20 - Although chest x-ray is read as possible CHF, clinically patient is volume depleted and not in CHF - Improving - UCx GNR. DC Vancomycin. - Apparently no blood cultures were sent on admission. - Urine culture sensitive not back yet.  GNR Acute pyelonephritis secondary to obstructing/infected left distal ureteral stone with left hydronephrosis  - Urology was consulted and patient underwent urgent cystoscopy and left double-J stent placement on 5/19  - Antibiotic management as above  - As per urology, patient will eventually need anesthetic ureteroscopy once her medical situation is stabilized and her infection treated.   Hypokalemia - Replace & follow.  Anemia - Slight drop probably d/t dilution or acute illness - FU CBC in am and transfuse if Hb < 7. - Stable  Essential hypertension/hypotension - Holding antihypertensives (HCTZ ). - Hypotension resolved with  IVF alone. - Resumed Toprol 5/20 - Controlled  Paroxysmal A. fib/permanent pacemaker - AV paced rhythm on telemetry - Resume beta blockers when blood pressure allows - Probably not a candidate for anticoagulation due to advanced age. Continue aspirin - CHA2DS2-VASc score: 6/high risk - resumed low dose BB 5/20  GERD - Continue PPI  Macular degeneration/legally blind and hearing impairment  DVT prophylaxis: Heparin subcutaneous Code Status: Limited code: No CPR/defibrillation/intubation/BiPAP/central line. Family Communication: On 5/19, Discussed extensively on multiple occasions with patient's daughter/healthcare power of attorney Ms. Mickie Hillier. Advised her regarding patient's critical illness. Disposition Plan: Transfer to tele 5/20. Possible DC home in next 24-48 hours pending final urine culture results   Consultants:  Urology   Procedures:  urgent cystoscopy and left double-J stent placement on 5/19   Antibiotics:  IV Rocephin 1 dose on 5/18   IV cefepime 5/19 >   IV vancomycin 5/19 >5/20  Subjective: Denied complaints. As per nursing this morning, doing well without acute events overnight.  Objective: Filed Vitals:   03/16/15 1400 03/16/15 1550 03/16/15 2104 03/17/15 0510  BP: 123/52 116/53 134/69 135/62  Pulse: 73 71 70 70  Temp:  98.1 F (36.7 C) 97.9 F (36.6 C) 98.9 F (37.2 C)  TempSrc:  Oral Oral Oral  Resp: Height:      Weight:      SpO2: 97% 97% 94% 97%    Intake/Output Summary (Last 24 hours) at 03/17/15 1238 Last data filed at 03/17/15 0900  Gross per 24 hour  Intake    170 ml  Output      0 ml  Net    170 ml   Filed  Weights   03/15/15 0321  Weight: 59.3 kg (130 lb 11.7 oz)     Exam:  General exam: Moderately built and thinly nourished pleasant elderly female lying comfortably supine in bed.  Respiratory system: clear to auscultation. No increased work of breathing. Cardiovascular system: S1 & S2 heard, RRR.  No JVD, murmurs, gallops, clicks or pedal edema.  Gastrointestinal system: Abdomen is nondistended, soft and nontender. Normal bowel sounds heard. Central nervous system: Alert and oriented 2. No focal neurological deficits. Decreased visual acuity and hard of hearing  Extremities: Symmetric 5 x 5 power.   Data Reviewed: Basic Metabolic Panel:  Recent Labs Lab 03/14/15 2132 03/15/15 0640 03/16/15 0325 03/17/15 0425  NA 139 141 140 138  K 3.5 2.8* 4.0 3.1*  CL 102 104 110 105  CO2 29 25 23 24   GLUCOSE 162* 143* 136* 133*  BUN 28* 28* 32* 26*  CREATININE 0.85 0.88 0.79 0.64  CALCIUM 10.2 9.1 9.2 9.1   Liver Function Tests:  Recent Labs Lab 03/14/15 2132  AST 31  ALT 24  ALKPHOS 108  BILITOT 0.6  PROT 7.7  ALBUMIN 4.0    Recent Labs Lab 03/14/15 2132  LIPASE 17*   No results for input(s): AMMONIA in the last 168 hours. CBC:  Recent Labs Lab 03/14/15 2132 03/15/15 0640 03/16/15 0325 03/17/15 0425  WBC 16.9* 16.3* 14.8* 10.7*  NEUTROABS 15.7*  --   --   --   HGB 11.8* 9.6* 8.7* 8.6*  HCT 37.8 30.8* 27.6* 28.0*  MCV 80.9 80.6 82.1 80.9  PLT 232 162 146* 139*   Cardiac Enzymes: No results for input(s): CKTOTAL, CKMB, CKMBINDEX, TROPONINI in the last 168 hours. BNP (last 3 results) No results for input(s): PROBNP in the last 8760 hours. CBG: No results for input(s): GLUCAP in the last 168 hours.  Recent Results (from the past 240 hour(s))  Urine culture     Status: None (Preliminary result)   Collection Time: 03/14/15 10:15 PM  Result Value Ref Range Status   Specimen Description URINE, CATHETERIZED  Final   Special Requests NONE  Final   Colony Count   Final    >=100,000 COLONIES/ML Performed at [facility]    Culture   Final    GRAM NEGATIVE RODS Performed at [facility]    Report Status PENDING  Incomplete  MRSA PCR Screening     Status: None   Collection Time: 03/15/15  3:22 AM  Result Value Ref Range Status   MRSA by  PCR NEGATIVE NEGATIVE Final    Comment:        The GeneXpert MRSA Assay (FDA approved for NASAL specimens only), is one component of a comprehensive MRSA colonization surveillance program. It is not intended to diagnose MRSA infection nor to guide or monitor treatment for MRSA infections.           Studies: No results found.      Scheduled Meds: . aspirin EC  81 mg Oral Q breakfast  . ceFEPime (MAXIPIME) IV  1 g Intravenous Q24H  . heparin  5,000 Units Subcutaneous 3 times per day  . metoprolol succinate  12.5 mg Oral Daily  . pantoprazole  40 mg Oral Q breakfast  . polyvinyl alcohol   Both Eyes BID  . sertraline  100 mg Oral QHS  . sodium chloride  3 mL Intravenous Q12H   Continuous Infusions:    Principal Problem:   Pyohydronephrosis Active Problems:   Essential hypertension  Sepsis secondary to UTI   Left ureteral stone   Severe sepsis    Time spent:35 minutes     Camya Haydon, MD, FACP, FHM. [facility] Pager [phone]  If 7PM-7AM, please contact night-coverage [email or web address] Password [email or web address] 03/17/2015, 12:38 PM    LOS: 3 days

## 2015-03-18 ENCOUNTER — Inpatient Hospital Stay (HOSPITAL_COMMUNITY): Payer: Medicare Other

## 2015-03-18 DIAGNOSIS — I5031 Acute diastolic (congestive) heart failure: Secondary | ICD-10-CM

## 2015-03-18 LAB — CBC
HEMATOCRIT: 27.7 % — AB (ref 36.0–46.0)
Hemoglobin: 8.9 g/dL — ABNORMAL LOW (ref 12.0–15.0)
MCH: 25.6 pg — ABNORMAL LOW (ref 26.0–34.0)
MCHC: 32.1 g/dL (ref 30.0–36.0)
MCV: 79.6 fL (ref 78.0–100.0)
PLATELETS: 162 10*3/uL (ref 150–400)
RBC: 3.48 MIL/uL — AB (ref 3.87–5.11)
RDW: 15.5 % (ref 11.5–15.5)
WBC: 9.5 10*3/uL (ref 4.0–10.5)

## 2015-03-18 LAB — BASIC METABOLIC PANEL
Anion gap: 7 (ref 5–15)
BUN: 24 mg/dL — ABNORMAL HIGH (ref 6–20)
CHLORIDE: 106 mmol/L (ref 101–111)
CO2: 24 mmol/L (ref 22–32)
CREATININE: 0.54 mg/dL (ref 0.44–1.00)
Calcium: 8.8 mg/dL — ABNORMAL LOW (ref 8.9–10.3)
Glucose, Bld: 114 mg/dL — ABNORMAL HIGH (ref 65–99)
Potassium: 3.3 mmol/L — ABNORMAL LOW (ref 3.5–5.1)
Sodium: 137 mmol/L (ref 135–145)

## 2015-03-18 MED ORDER — IPRATROPIUM-ALBUTEROL 0.5-2.5 (3) MG/3ML IN SOLN
3.0000 mL | Freq: Four times a day (QID) | RESPIRATORY_TRACT | Status: DC
  Administered 2015-03-18 – 2015-03-19 (×3): 3 mL via RESPIRATORY_TRACT
  Filled 2015-03-18 (×3): qty 3

## 2015-03-18 MED ORDER — SULFAMETHOXAZOLE-TRIMETHOPRIM 400-80 MG PO TABS
1.0000 | ORAL_TABLET | Freq: Two times a day (BID) | ORAL | Status: DC
  Administered 2015-03-18 – 2015-03-25 (×14): 1 via ORAL
  Filled 2015-03-18 (×17): qty 1

## 2015-03-18 MED ORDER — FUROSEMIDE 10 MG/ML IJ SOLN
40.0000 mg | Freq: Once | INTRAMUSCULAR | Status: AC
  Administered 2015-03-18: 40 mg via INTRAVENOUS
  Filled 2015-03-18: qty 4

## 2015-03-18 MED ORDER — ALBUTEROL SULFATE (2.5 MG/3ML) 0.083% IN NEBU
2.5000 mg | INHALATION_SOLUTION | RESPIRATORY_TRACT | Status: DC | PRN
Start: 2015-03-18 — End: 2015-03-25

## 2015-03-18 MED ORDER — POTASSIUM CHLORIDE CRYS ER 20 MEQ PO TBCR
40.0000 meq | EXTENDED_RELEASE_TABLET | Freq: Once | ORAL | Status: AC
  Administered 2015-03-18: 40 meq via ORAL
  Filled 2015-03-18: qty 2

## 2015-03-18 NOTE — Progress Notes (Signed)
Addendum  CXR c/w Pulmonary edema. Gave Lasix 40 mg IV once earlier today- has diuresed/incontinent but continues to be dyspneic. DD: still volume overloaded Vs ALI/ARDS from recent acute infection. Examined patient: mild respiratory distress, tachypnea, speaks in short sentences with dyspnea, RS: reduced BS's b/l with basal crackles.  Will provide additional dose of IV Lasix, Foley for comfort/not having to change sheets repeatedly, nebs, O2. Monitor closely. If doesn't improve/declines, reassess but may have limited options except to pursue comfort care given code status - discussed extensively with daughter at bedside who is in agreement.  Marcellus ScottHONGALGI,Audra Kagel, MD, FACP, FHM. [facility] Pager [phone]  If 7PM-7AM, please contact night-coverage [email or web address] Password [email or web address] 03/18/2015, 7:00 PM

## 2015-03-18 NOTE — Progress Notes (Signed)
PROGRESS NOTE    Brenda GarretHelen C Cook ZOX:[id number] DOB: 1921-02-07 DOA: 03/14/2015 PCP: Thora LanceEHINGER,ROBERT R, MD  HPI/Brief narrative 81 y.o. female who presents to the ED with LLQ and left flank pain, N/V, multiple episodes of NBNB vomiting. Small amount of diarrhea. No fevers no dysuria, no chest pain, no SOB. Symptoms are moderate, constant, unchanging, nothing makes better or worse.  W/U in ED finds sepsis with tachypnea, elevated WBC. UA is c/w UTI. CT abd/pelvis demonstrates obstructing stone L ureter.  Assessment/Plan:  Severe sepsis, present on admission-secondary to Proteus mirabilis acute pyelonephritis/infected left distal ureteral stone - Admitted to stepdown unit - Hydrated aggressively with IV fluids, initially started on Rocephin which was changed to IV vancomycin and cefepime pending culture results - On 5/19, Despite IV fluid boluses, blood pressures are soft in the 90s (as per family, patient probably has chronic low blood pressures).  - Checking random cortisol: 21 and started stress dose IV hydrocortisone - DC'ed 5/20 - Although chest x-ray is read as possible CHF, clinically patient is volume depleted and not in CHF - Improving - UCx GNR. DC Vancomycin. - Apparently no blood cultures were sent on admission. - Urine culture confirms Proteus mirabilis - Will change Abx from Cefepime to oral Bactrim and complete total 10 days treatment.  Proteus mirabilis Acute pyelonephritis secondary to obstructing/infected left distal ureteral stone with left hydronephrosis  - Urology was consulted and patient underwent urgent cystoscopy and left double-J stent placement on 5/19  - Antibiotic management as above  - As per urology, patient will eventually need anesthetic ureteroscopy once her medical situation is stabilized and her infection treated.   Hypokalemia - Replace & follow.  Anemia - Slight drop probably d/t dilution or acute illness - FU CBC in am and transfuse if Hb <  7. - Stable  Essential hypertension/hypotension - Holding antihypertensives (HCTZ ). - Hypotension resolved with IVF alone. - Resumed Toprol 5/20 - Controlled  Paroxysmal A. fib/permanent pacemaker - AV paced rhythm on telemetry - Resume beta blockers when blood pressure allows - Probably not a candidate for anticoagulation due to advanced age. Continue aspirin - CHA2DS2-VASc score: 6/high risk - resumed low dose BB 5/20  GERD - Continue PPI  Macular degeneration/legally blind and hearing impairment  Acute diastolic CHF - Lasix 40 mg IV 1 dose - 2-D echo 2009 showed EF 55-60 percent - Repeat 2-D echo - Likely due to high-volume fluid resuscitation on initial presentation   DVT prophylaxis: Heparin subcutaneous Code Status: Limited code: No CPR/defibrillation/intubation/BiPAP/central line. Family Communication: Discussed extensively with patient's daughter/healthcare power of attorney Ms. Mickie HillierShirley Harris at bedside on 5/22. Disposition Plan: Transfer to tele 5/20. Possible DC home in next 24-48 hours.   Consultants:  Urology   Procedures:  urgent cystoscopy and left double-J stent placement on 5/19   Antibiotics:  IV Rocephin 1 dose on 5/18   IV cefepime 5/19 >5/22   IV vancomycin 5/19 >5/20  Oral Bactrim single strength 5/22 >  Subjective: As per nursing and daughter, patient has dyspnea since last night intermittently but no cough or chest pain.  Objective: Filed Vitals:   03/17/15 1415 03/17/15 1943 03/17/15 2038 03/18/15 0412  BP: 135/67  106/54 123/65  Pulse: 66  66 74  Temp: 98 F (36.7 C)  98.7 F (37.1 C) 98.7 F (37.1 C)  TempSrc: Oral  Oral Oral  Resp: 18 32 24 22  Height:      Weight:      SpO2: 94% 96%  93% 94%    Intake/Output Summary (Last 24 hours) at 03/18/15 1202 Last data filed at 03/18/15 1610  Gross per 24 hour  Intake    735 ml  Output      0 ml  Net    735 ml   Filed Weights   03/15/15 0321  Weight: 59.3 kg (130 lb  11.7 oz)     Exam:  General exam: Moderately built and thinly nourished pleasant elderly female lying supine in bed with mild tachypnea.  Respiratory system: Reduced breath sounds in the bases with scattered few bibasal fine crackles. Rest of lung fields clear to auscultation. No increased work of breathing. Cardiovascular system: S1 & S2 heard, RRR. No JVD, murmurs, gallops, clicks or pedal edema.  Gastrointestinal system: Abdomen is nondistended, soft and nontender. Normal bowel sounds heard. Central nervous system: Alert and oriented 2. No focal neurological deficits. Decreased visual acuity and hard of hearing  Extremities: Symmetric 5 x 5 power.   Data Reviewed: Basic Metabolic Panel:  Recent Labs Lab 03/14/15 2132 03/15/15 0640 03/16/15 0325 03/17/15 0425 03/18/15 0434  NA 139 141 140 138 137  K 3.5 2.8* 4.0 3.1* 3.3*  CL 102 104 110 105 106  CO2 GLUCOSE 162* 143* 136* 133* 114*  BUN 28* 28* 32* 26* 24*  CREATININE 0.85 0.88 0.79 0.64 0.54  CALCIUM 10.2 9.1 9.2 9.1 8.8*   Liver Function Tests:  Recent Labs Lab 03/14/15 2132  AST 31  ALT 24  ALKPHOS 108  BILITOT 0.6  PROT 7.7  ALBUMIN 4.0    Recent Labs Lab 03/14/15 2132  LIPASE 17*   No results for input(s): AMMONIA in the last 168 hours. CBC:  Recent Labs Lab 03/14/15 2132 03/15/15 0640 03/16/15 0325 03/17/15 0425 03/18/15 0434  WBC 16.9* 16.3* 14.8* 10.7* 9.5  NEUTROABS 15.7*  --   --   --   --   HGB 11.8* 9.6* 8.7* 8.6* 8.9*  HCT 37.8 30.8* 27.6* 28.0* 27.7*  MCV 80.9 80.6 82.1 80.9 79.6  PLT 232 162 146* 139* 162   Cardiac Enzymes: No results for input(s): CKTOTAL, CKMB, CKMBINDEX, TROPONINI in the last 168 hours. BNP (last 3 results) No results for input(s): PROBNP in the last 8760 hours. CBG: No results for input(s): GLUCAP in the last 168 hours.  Recent Results (from the past 240 hour(s))  Urine culture     Status: None   Collection Time: 03/14/15 10:15 PM    Result Value Ref Range Status   Specimen Description URINE, CATHETERIZED  Final   Special Requests NONE  Final   Colony Count   Final    >=100,000 COLONIES/ML Performed at [facility]    Culture   Final    PROTEUS MIRABILIS Performed at [facility]    Report Status 03/17/2015 FINAL  Final   Organism ID, Bacteria PROTEUS MIRABILIS  Final      Susceptibility   Proteus mirabilis - MIC*    AMPICILLIN <=2 SENSITIVE Sensitive     CEFAZOLIN <=4 SENSITIVE Sensitive     CEFTRIAXONE <=1 SENSITIVE Sensitive     CIPROFLOXACIN <=0.25 SENSITIVE Sensitive     GENTAMICIN <=1 SENSITIVE Sensitive     LEVOFLOXACIN <=0.12 SENSITIVE Sensitive     NITROFURANTOIN 128 RESISTANT Resistant     TOBRAMYCIN <=1 SENSITIVE Sensitive     TRIMETH/SULFA <=20 SENSITIVE Sensitive     * PROTEUS MIRABILIS  MRSA PCR Screening  Status: None   Collection Time: 03/15/15  3:22 AM  Result Value Ref Range Status   MRSA by PCR NEGATIVE NEGATIVE Final    Comment:        The GeneXpert MRSA Assay (FDA approved for NASAL specimens only), is one component of a comprehensive MRSA colonization surveillance program. It is not intended to diagnose MRSA infection nor to guide or monitor treatment for MRSA infections.           Studies: Dg Chest Port 1 View  03/18/2015   CLINICAL DATA:  Shortness of breath today.  Weakness.  Lethargy.  EXAM: PORTABLE CHEST - 1 VIEW  COMPARISON:  03/15/2015.  FINDINGS: There is less patient rotation to the right. No gross change in enlargement of the cardiac silhouette and prominence of the pulmonary vasculature. The interstitial markings appear less prominent. Interval patchy opacity at both lung bases and small bilateral pleural effusions. Stable left subclavian pacemaker leads and right subclavian pacemaker leads, including one broken lead. Diffuse osteopenia. Old, healed right humeral neck fracture.  IMPRESSION: 1. Interval small bilateral pleural effusions and  probable alveolar edema at both lung bases. 2. Less prominent interstitial pulmonary edema. 3. Stable cardiomegaly and pulmonary vascular congestion.   Electronically Signed   By: Beckie Salts M.D.   On: 03/18/2015 10:24        Scheduled Meds: . aspirin EC  81 mg Oral Q breakfast  . ceFEPime (MAXIPIME) IV  1 g Intravenous Q24H  . furosemide  40 mg Intravenous Once  . heparin  5,000 Units Subcutaneous 3 times per day  . metoprolol succinate  12.5 mg Oral Daily  . pantoprazole  40 mg Oral Q breakfast  . polyvinyl alcohol   Both Eyes BID  . sertraline  100 mg Oral QHS  . sodium chloride  3 mL Intravenous Q12H   Continuous Infusions:    Principal Problem:   Pyohydronephrosis Active Problems:   Essential hypertension   Sepsis secondary to UTI   Left ureteral stone   Severe sepsis    Time spent: 40 minutes     Clarie Camey, MD, FACP, FHM. [facility] Pager [phone]  If 7PM-7AM, please contact night-coverage [email or web address] Password [email or web address] 03/18/2015, 12:02 PM    LOS: 4 days

## 2015-03-19 ENCOUNTER — Inpatient Hospital Stay (HOSPITAL_COMMUNITY): Payer: Medicare Other

## 2015-03-19 LAB — BASIC METABOLIC PANEL
ANION GAP: 9 (ref 5–15)
BUN: 19 mg/dL (ref 6–20)
CALCIUM: 9 mg/dL (ref 8.9–10.3)
CHLORIDE: 105 mmol/L (ref 101–111)
CO2: 27 mmol/L (ref 22–32)
Creatinine, Ser: 0.68 mg/dL (ref 0.44–1.00)
GFR calc Af Amer: >60 mL/min (ref >60)
GFR calc non Af Amer: >60 mL/min (ref >60)
GLUCOSE: 108 mg/dL — AB (ref 65–99)
POTASSIUM: 3 mmol/L — AB (ref 3.5–5.1)
Sodium: 141 mmol/L (ref 135–145)

## 2015-03-19 MED ORDER — FUROSEMIDE 10 MG/ML IJ SOLN
40.0000 mg | Freq: Once | INTRAMUSCULAR | Status: AC
  Administered 2015-03-19: 40 mg via INTRAVENOUS
  Filled 2015-03-19 (×2): qty 4

## 2015-03-19 MED ORDER — POTASSIUM CHLORIDE CRYS ER 20 MEQ PO TBCR
40.0000 meq | EXTENDED_RELEASE_TABLET | ORAL | Status: AC
  Administered 2015-03-19 (×2): 40 meq via ORAL
  Filled 2015-03-19 (×2): qty 2

## 2015-03-19 NOTE — Plan of Care (Signed)
Problem: Phase I Progression Outcomes Goal: Voiding-avoid urinary catheter unless indicated Outcome: Not Met (add Reason) Foley for aggressive diuresis

## 2015-03-19 NOTE — Progress Notes (Signed)
PROGRESS NOTE    Brenda Cook JOA:[id number] DOB: 07-31-1921 DOA: 03/14/2015 PCP: [name], MD  HPI/Brief narrative 81 y.o. female who presents to the ED with LLQ and left flank pain, N/V, multiple episodes of NBNB vomiting. Small amount of diarrhea. No fevers no dysuria, no chest pain, no SOB. Symptoms are moderate, constant, unchanging, nothing makes better or worse.  W/U in ED finds sepsis with tachypnea, elevated WBC. UA is c/w UTI. CT abd/pelvis demonstrates obstructing stone L ureter.  Assessment/Plan:  Severe sepsis, present on admission-secondary to Proteus mirabilis acute pyelonephritis/infected left distal ureteral stone - Admitted to stepdown unit - Hydrated aggressively with IV fluids, initially started on Rocephin which was changed to IV vancomycin and cefepime pending culture results - On 5/19, Despite IV fluid boluses, blood pressures are soft in the 90s (as per family, patient probably has chronic low blood pressures).  - Checking random cortisol: 21 and started stress dose IV hydrocortisone - DC'ed 5/20 - Although chest x-ray is read as possible CHF, clinically patient is volume depleted and not in CHF - Improving - UCx GNR. DC Vancomycin. - Apparently no blood cultures were sent on admission. - Urine culture confirms Proteus mirabilis - Changed Abx from Cefepime to oral Bactrim and complete total 10 days treatment.  Proteus mirabilis Acute pyelonephritis secondary to obstructing/infected left distal ureteral stone with left hydronephrosis  - Urology was consulted and patient underwent urgent cystoscopy and left double-J stent placement on 5/19  - Antibiotic management as above  - As per urology, patient will eventually need anesthetic ureteroscopy once her medical situation is stabilized and her infection treated.   Hypokalemia - Replace & follow.  Anemia - Slight drop probably d/t dilution or acute illness - FU CBC in am and transfuse if Hb <  7. - Stable  Essential hypertension/hypotension - Holding antihypertensives (HCTZ ). - Hypotension resolved with IVF alone. - Resumed Toprol 5/20 - Controlled  Paroxysmal A. fib/permanent pacemaker - AV paced rhythm on telemetry - Resume beta blockers when blood pressure allows - Probably not a candidate for anticoagulation due to advanced age. Continue aspirin - CHA2DS2-VASc score: 6/high risk - resumed low dose BB 5/20  GERD - Continue PPI  Macular degeneration/legally blind and hearing impairment  Acute diastolic CHF - 2-D echo 2009 showed EF 55-60 percent - Repeat 2-D echo - Likely due to high-volume fluid resuscitation on initial presentation  - Patient developed worsening dyspnea since 5/22. Received a couple of doses of IV Lasix. Foley catheter placed for comfort. - Clinically improved. Chest x-ray also slightly better. - Continue when necessary doses of IV Lasix and monitor closely. - Patient's daughter is aware that if she does not improve or worsens, next option will be comfort oriented care.  DVT prophylaxis: Heparin subcutaneous Code Status: Limited code: No CPR/defibrillation/intubation/BiPAP/central line. Family Communication: Discussed extensively with patient's daughter/healthcare power of attorney Ms. Mickie Hillier at bedside on 5/22. Disposition Plan: Transfer to tele 5/20. DC home when medically stable.   Consultants:  Urology   Procedures:  urgent cystoscopy and left double-J stent placement on 5/19   Foley catheter 5/22 >  Antibiotics:  IV Rocephin 1 dose on 5/18   IV cefepime 5/19 >5/22   IV vancomycin 5/19 >5/20  Oral Bactrim single strength 5/22 >  Subjective: Dyspnea improved compared to last night.  Objective: Filed Vitals:   03/19/15 0151 03/19/15 0547 03/19/15 0920 03/19/15 1437  BP:  114/50  104/53  Pulse:  70  70  Temp:  98.9 F (37.2 C)  98.3 F (36.8 C)  TempSrc:  Oral  Oral  Resp:  28  21  Height:      Weight:       SpO2: 94% 94% 97% 97%    Intake/Output Summary (Last 24 hours) at 03/19/15 1557 Last data filed at 03/19/15 1438  Gross per 24 hour  Intake    600 ml  Output   2000 ml  Net  -1400 ml   Filed Weights   03/15/15 0321  Weight: 59.3 kg (130 lb 11.7 oz)     Exam:  General exam: Moderately built and thinly nourished pleasant elderly female lying supine in bed in no obvious distress.  Respiratory system: Improved breath sounds bilaterally. Few basal crackles. No increased work of breathing. Cardiovascular system: S1 & S2 heard, RRR. No JVD, murmurs, gallops, clicks or pedal edema.  Gastrointestinal system: Abdomen is nondistended, soft and nontender. Normal bowel sounds heard. Central nervous system: Alert and oriented 2. No focal neurological deficits. Decreased visual acuity and hard of hearing  Extremities: Symmetric 5 x 5 power.   Data Reviewed: Basic Metabolic Panel:  Recent Labs Lab 03/15/15 0640 03/16/15 0325 03/17/15 0425 03/18/15 0434 03/19/15 0444  NA 141 140 138 137 141  K 2.8* 4.0 3.1* 3.3* 3.0*  CL 104 110 105 106 105  CO2 GLUCOSE 143* 136* 133* 114* 108*  BUN 28* 32* 26* 24* 19  CREATININE 0.88 0.79 0.64 0.54 0.68  CALCIUM 9.1 9.2 9.1 8.8* 9.0   Liver Function Tests:  Recent Labs Lab 03/14/15 2132  AST 31  ALT 24  ALKPHOS 108  BILITOT 0.6  PROT 7.7  ALBUMIN 4.0    Recent Labs Lab 03/14/15 2132  LIPASE 17*   No results for input(s): AMMONIA in the last 168 hours. CBC:  Recent Labs Lab 03/14/15 2132 03/15/15 0640 03/16/15 0325 03/17/15 0425 03/18/15 0434  WBC 16.9* 16.3* 14.8* 10.7* 9.5  NEUTROABS 15.7*  --   --   --   --   HGB 11.8* 9.6* 8.7* 8.6* 8.9*  HCT 37.8 30.8* 27.6* 28.0* 27.7*  MCV 80.9 80.6 82.1 80.9 79.6  PLT 232 162 146* 139* 162   Cardiac Enzymes: No results for input(s): CKTOTAL, CKMB, CKMBINDEX, TROPONINI in the last 168 hours. BNP (last 3 results) No results for input(s): PROBNP in the last  8760 hours. CBG: No results for input(s): GLUCAP in the last 168 hours.  Recent Results (from the past 240 hour(s))  Urine culture     Status: None   Collection Time: 03/14/15 10:15 PM  Result Value Ref Range Status   Specimen Description URINE, CATHETERIZED  Final   Special Requests NONE  Final   Colony Count   Final    >=100,000 COLONIES/ML Performed at [facility]    Culture   Final    PROTEUS MIRABILIS Performed at [facility]    Report Status 03/17/2015 FINAL  Final   Organism ID, Bacteria PROTEUS MIRABILIS  Final      Susceptibility   Proteus mirabilis - MIC*    AMPICILLIN <=2 SENSITIVE Sensitive     CEFAZOLIN <=4 SENSITIVE Sensitive     CEFTRIAXONE <=1 SENSITIVE Sensitive     CIPROFLOXACIN <=0.25 SENSITIVE Sensitive     GENTAMICIN <=1 SENSITIVE Sensitive     LEVOFLOXACIN <=0.12 SENSITIVE Sensitive     NITROFURANTOIN 128 RESISTANT Resistant     TOBRAMYCIN <=1 SENSITIVE Sensitive  TRIMETH/SULFA <=20 SENSITIVE Sensitive     * PROTEUS MIRABILIS  MRSA PCR Screening     Status: None   Collection Time: 03/15/15  3:22 AM  Result Value Ref Range Status   MRSA by PCR NEGATIVE NEGATIVE Final    Comment:        The GeneXpert MRSA Assay (FDA approved for NASAL specimens only), is one component of a comprehensive MRSA colonization surveillance program. It is not intended to diagnose MRSA infection nor to guide or monitor treatment for MRSA infections.           Studies: Dg Chest Port 1 View  03/19/2015   CLINICAL DATA:  Respiratory distress, sepsis, history of cardiac dysrhythmia  EXAM: PORTABLE CHEST - 1 VIEW  COMPARISON:  Portable chest x-ray of Mar 18, 2015  FINDINGS: The lungs are adequately inflated. Small bilateral pleural effusions remain. The interstitial markings remain increased. The cardiac silhouette is enlarged. The pulmonary vascularity is more distinct today. The pacemaker is are unchanged in position. There is degenerative and  likely post traumatic change of the right shoulder which is stable.  IMPRESSION: CHF superimposed upon COPD. There has been mild interval improvement in the appearance of the pulmonary interstitium. Small bilateral pleural effusions remain.   Electronically Signed   By: David  SwazilandJordan M.D.   On: 03/19/2015 07:49   Dg Chest Port 1 View  03/18/2015   CLINICAL DATA:  Shortness of breath today.  Weakness.  Lethargy.  EXAM: PORTABLE CHEST - 1 VIEW  COMPARISON:  03/15/2015.  FINDINGS: There is less patient rotation to the right. No gross change in enlargement of the cardiac silhouette and prominence of the pulmonary vasculature. The interstitial markings appear less prominent. Interval patchy opacity at both lung bases and small bilateral pleural effusions. Stable left subclavian pacemaker leads and right subclavian pacemaker leads, including one broken lead. Diffuse osteopenia. Old, healed right humeral neck fracture.  IMPRESSION: 1. Interval small bilateral pleural effusions and probable alveolar edema at both lung bases. 2. Less prominent interstitial pulmonary edema. 3. Stable cardiomegaly and pulmonary vascular congestion.   Electronically Signed   By: Beckie SaltsSteven  Reid M.D.   On: 03/18/2015 10:24        Scheduled Meds: . aspirin EC  81 mg Oral Q breakfast  . furosemide  40 mg Intravenous Once  . heparin  5,000 Units Subcutaneous 3 times per day  . metoprolol succinate  12.5 mg Oral Daily  . pantoprazole  40 mg Oral Q breakfast  . polyvinyl alcohol   Both Eyes BID  . sertraline  100 mg Oral QHS  . sodium chloride  3 mL Intravenous Q12H  . sulfamethoxazole-trimethoprim  1 tablet Oral BID   Continuous Infusions:    Principal Problem:   Pyohydronephrosis Active Problems:   Essential hypertension   Sepsis secondary to UTI   Left ureteral stone   Severe sepsis    Time spent: 25 minutes     Kayceon Oki, MD, FACP, FHM. [facility] Pager [phone]  If 7PM-7AM, please contact  night-coverage [email or web address] Password [email or web address] 03/19/2015, 3:57 PM    LOS: 5 days

## 2015-03-20 ENCOUNTER — Inpatient Hospital Stay (HOSPITAL_COMMUNITY): Payer: Medicare Other

## 2015-03-20 LAB — BASIC METABOLIC PANEL
Anion gap: 7 (ref 5–15)
BUN: 22 mg/dL — AB (ref 6–20)
CO2: 28 mmol/L (ref 22–32)
Calcium: 9.2 mg/dL (ref 8.9–10.3)
Chloride: 106 mmol/L (ref 101–111)
Creatinine, Ser: 0.69 mg/dL (ref 0.44–1.00)
GFR calc Af Amer: >60 mL/min (ref >60)
GLUCOSE: 110 mg/dL — AB (ref 65–99)
Potassium: 3.9 mmol/L (ref 3.5–5.1)
Sodium: 141 mmol/L (ref 135–145)

## 2015-03-20 MED ORDER — FUROSEMIDE 10 MG/ML IJ SOLN
40.0000 mg | Freq: Two times a day (BID) | INTRAMUSCULAR | Status: DC
  Administered 2015-03-20 – 2015-03-21 (×3): 40 mg via INTRAVENOUS
  Filled 2015-03-20 (×5): qty 4

## 2015-03-20 NOTE — Progress Notes (Signed)
PROGRESS NOTE    Brenda Cook WGN:[id number] DOB: June 29, 1921 DOA: 03/14/2015 PCP: [name], MD  HPI/Brief narrative 81 y.o. female who presents to the ED with LLQ and left flank pain, N/V, multiple episodes of NBNB vomiting. Small amount of diarrhea. No fevers no dysuria, no chest pain, no SOB. Symptoms are moderate, constant, unchanging, nothing makes better or worse.  W/U in ED finds sepsis with tachypnea, elevated WBC. UA is c/w UTI. CT abd/pelvis demonstrates obstructing stone L ureter.  Assessment/Plan:  Severe sepsis, present on admission-secondary to Proteus mirabilis acute pyelonephritis/infected left distal ureteral stone - Initially admitted to stepdown unit and treated aggressively with broad-spectrum IV antibiotics, aggressive IV fluid hydration, brief IV steroids. - Hypotensive initially which has resolved - Urine cultures confirmed Proteus mirabilis. Apparently no blood cultures were sent on admission. - Eventually antibiotics descalated to oral Bactrim and will plan total 14 days treatment (end date 03/27/15) due to recent intervention/stent placement.  Proteus mirabilis Acute pyelonephritis secondary to obstructing/infected left distal ureteral stone with left hydronephrosis  - Urology was consulted and patient underwent urgent cystoscopy and left double-J stent placement on 5/19  - Antibiotic management as above  - As per urology, patient will eventually need anesthetic ureteroscopy once her medical situation is stabilized and her infection treated.   Hypokalemia - Replace & follow as needed.  Anemia - Slight drop probably d/t dilution or acute illness - FU CBC in am and transfuse if Hb < 7. - Stable  Essential hypertension/hypotension - Holding antihypertensives (HCTZ ). - Hypotension resolved with IVF alone. - Resumed Toprol 5/20 - Controlled  Paroxysmal A. fib/permanent pacemaker - AV paced rhythm on telemetry - Probably not a candidate for  anticoagulation due to advanced age. Continue aspirin - CHA2DS2-VASc score: 6/high risk - resumed low dose BB 5/20  GERD - Continue PPI  Macular degeneration/legally blind and hearing impairment  Acute diastolic CHF - 2-D echo 2009 showed EF 55-60 percent - Repeat 2-D echo: Pending - Likely due to high-volume fluid resuscitation on initial presentation  - Patient developed worsening dyspnea since 5/22.  - Patient has been receiving daily IV Lasix doses since 5/22. She has made some improvement clinically and radiologically but still has some degree of dyspnea and pulmonary edema on chest x-ray. - Continue IV Lasix 40 mg every 12 hours - Patient's daughter is aware that if she does not improve or worsens, next option will be comfort oriented care.  DVT prophylaxis: Heparin subcutaneous Code Status: Limited code: No CPR/defibrillation/intubation/BiPAP/central line. Family Communication: Discussed extensively with patient's daughter/healthcare power of attorney Ms. Mickie Hillier at bedside on 5/22. Discussed with patient's grandson at bedside on 5/23. None at bedside today. Disposition Plan: Transfer to tele 5/20. DC home when medically stable. Not medically stable for discharge.   Consultants:  Urology   Procedures:  urgent cystoscopy and left double-J stent placement on 5/19   Foley catheter 5/22 >  Antibiotics:  IV Rocephin 1 dose on 5/18   IV cefepime 5/19 >5/22   IV vancomycin 5/19 >5/20  Oral Bactrim single strength 5/22 > 5/31  Subjective: Patient denies dyspnea but appears short of breath with minimal activity or talking. Appears as though intake output has not been adequately charted-discussed with nursing.  Objective: Filed Vitals:   03/19/15 0920 03/19/15 1437 03/19/15 2125 03/20/15 0624  BP:  104/53 111/62 112/58  Pulse:  70 71 71  Temp:  98.3 F (36.8 C) 98.6 F (37 C) 98.2 F (36.8 C)  TempSrc:  Oral Oral Oral  Resp:  Height:        Weight:      SpO2: 97% 97% 95% 94%    Intake/Output Summary (Last 24 hours) at 03/20/15 1154 Last data filed at 03/20/15 0948  Gross per 24 hour  Intake    900 ml  Output   2050 ml  Net  -1150 ml   Filed Weights   03/15/15 0321  Weight: 59.3 kg (130 lb 11.7 oz)     Exam:  General exam: Moderately built and thinly nourished pleasant elderly female lying supine in bed with mild increased work of breathing with minimal activity.  Respiratory system: Improved breath sounds bilaterally. Few basal crackles.  Cardiovascular system: S1 & S2 heard, RRR. No JVD, murmurs, gallops, clicks or pedal edema.  Gastrointestinal system: Abdomen is nondistended, soft and nontender. Normal bowel sounds heard. Central nervous system: Alert and oriented 2. No focal neurological deficits. Decreased visual acuity and hard of hearing  Extremities: Symmetric 5 x 5 power.   Data Reviewed: Basic Metabolic Panel:  Recent Labs Lab 03/16/15 0325 03/17/15 0425 03/18/15 0434 03/19/15 0444 03/20/15 0407  NA 140 138 137 141 141  K 4.0 3.1* 3.3* 3.0* 3.9  CL 110 105 106 105 106  CO2 GLUCOSE 136* 133* 114* 108* 110*  BUN 32* 26* 24* 19 22*  CREATININE 0.79 0.64 0.54 0.68 0.69  CALCIUM 9.2 9.1 8.8* 9.0 9.2   Liver Function Tests:  Recent Labs Lab 03/14/15 2132  AST 31  ALT 24  ALKPHOS 108  BILITOT 0.6  PROT 7.7  ALBUMIN 4.0    Recent Labs Lab 03/14/15 2132  LIPASE 17*   No results for input(s): AMMONIA in the last 168 hours. CBC:  Recent Labs Lab 03/14/15 2132 03/15/15 0640 03/16/15 0325 03/17/15 0425 03/18/15 0434  WBC 16.9* 16.3* 14.8* 10.7* 9.5  NEUTROABS 15.7*  --   --   --   --   HGB 11.8* 9.6* 8.7* 8.6* 8.9*  HCT 37.8 30.8* 27.6* 28.0* 27.7*  MCV 80.9 80.6 82.1 80.9 79.6  PLT 232 162 146* 139* 162   Cardiac Enzymes: No results for input(s): CKTOTAL, CKMB, CKMBINDEX, TROPONINI in the last 168 hours. BNP (last 3 results) No results for input(s):  PROBNP in the last 8760 hours. CBG: No results for input(s): GLUCAP in the last 168 hours.  Recent Results (from the past 240 hour(s))  Urine culture     Status: None   Collection Time: 03/14/15 10:15 PM  Result Value Ref Range Status   Specimen Description URINE, CATHETERIZED  Final   Special Requests NONE  Final   Colony Count   Final    >=100,000 COLONIES/ML Performed at [facility]    Culture   Final    PROTEUS MIRABILIS Performed at [facility]    Report Status 03/17/2015 FINAL  Final   Organism ID, Bacteria PROTEUS MIRABILIS  Final      Susceptibility   Proteus mirabilis - MIC*    AMPICILLIN <=2 SENSITIVE Sensitive     CEFAZOLIN <=4 SENSITIVE Sensitive     CEFTRIAXONE <=1 SENSITIVE Sensitive     CIPROFLOXACIN <=0.25 SENSITIVE Sensitive     GENTAMICIN <=1 SENSITIVE Sensitive     LEVOFLOXACIN <=0.12 SENSITIVE Sensitive     NITROFURANTOIN 128 RESISTANT Resistant     TOBRAMYCIN <=1 SENSITIVE Sensitive     TRIMETH/SULFA <=20 SENSITIVE Sensitive     *  PROTEUS MIRABILIS  MRSA PCR Screening     Status: None   Collection Time: 03/15/15  3:22 AM  Result Value Ref Range Status   MRSA by PCR NEGATIVE NEGATIVE Final    Comment:        The GeneXpert MRSA Assay (FDA approved for NASAL specimens only), is one component of a comprehensive MRSA colonization surveillance program. It is not intended to diagnose MRSA infection nor to guide or monitor treatment for MRSA infections.           Studies: Dg Chest 2 View  03/20/2015   CLINICAL DATA:  Congestive heart failure.  EXAM: CHEST  2 VIEW  COMPARISON:  February 17, 2015.  FINDINGS: Stable cardiomegaly. No pneumothorax is noted. Stable pacemakers are noted bilaterally. Minimal bilateral pleural effusions are noted. Stable bilateral pulmonary edema is noted.  IMPRESSION: Stable bilateral pulmonary edema and minimal pleural effusions are noted concerning for congestive heart failure.   Electronically Signed    By: Lupita RaiderJames  Green Jr, M.D.   On: 03/20/2015 11:44   Dg Chest Port 1 View  03/19/2015   CLINICAL DATA:  Respiratory distress, sepsis, history of cardiac dysrhythmia  EXAM: PORTABLE CHEST - 1 VIEW  COMPARISON:  Portable chest x-ray of Mar 18, 2015  FINDINGS: The lungs are adequately inflated. Small bilateral pleural effusions remain. The interstitial markings remain increased. The cardiac silhouette is enlarged. The pulmonary vascularity is more distinct today. The pacemaker is are unchanged in position. There is degenerative and likely post traumatic change of the right shoulder which is stable.  IMPRESSION: CHF superimposed upon COPD. There has been mild interval improvement in the appearance of the pulmonary interstitium. Small bilateral pleural effusions remain.   Electronically Signed   By: David  SwazilandJordan M.D.   On: 03/19/2015 07:49        Scheduled Meds: . aspirin EC  81 mg Oral Q breakfast  . furosemide  40 mg Intravenous Q12H  . heparin  5,000 Units Subcutaneous 3 times per day  . metoprolol succinate  12.5 mg Oral Daily  . pantoprazole  40 mg Oral Q breakfast  . polyvinyl alcohol   Both Eyes BID  . sertraline  100 mg Oral QHS  . sodium chloride  3 mL Intravenous Q12H  . sulfamethoxazole-trimethoprim  1 tablet Oral BID   Continuous Infusions:    Principal Problem:   Pyohydronephrosis Active Problems:   Essential hypertension   Sepsis secondary to UTI   Left ureteral stone   Severe sepsis    Time spent: 25 minutes     Tzivia Oneil, MD, FACP, FHM. [facility] Pager [phone]  If 7PM-7AM, please contact night-coverage [email or web address] Password [email or web address] 03/20/2015, 11:54 AM    LOS: 6 days

## 2015-03-21 LAB — BASIC METABOLIC PANEL
Anion gap: 10 (ref 5–15)
BUN: 25 mg/dL — AB (ref 6–20)
CHLORIDE: 101 mmol/L (ref 101–111)
CO2: 28 mmol/L (ref 22–32)
CREATININE: 0.9 mg/dL (ref 0.44–1.00)
Calcium: 9.5 mg/dL (ref 8.9–10.3)
GFR, EST NON AFRICAN AMERICAN: 54 mL/min — AB (ref >60)
Glucose, Bld: 106 mg/dL — ABNORMAL HIGH (ref 65–99)
Potassium: 3.5 mmol/L (ref 3.5–5.1)
Sodium: 139 mmol/L (ref 135–145)

## 2015-03-21 LAB — CBC
HCT: 29.6 % — ABNORMAL LOW (ref 36.0–46.0)
Hemoglobin: 9.4 g/dL — ABNORMAL LOW (ref 12.0–15.0)
MCH: 25.7 pg — ABNORMAL LOW (ref 26.0–34.0)
MCHC: 31.8 g/dL (ref 30.0–36.0)
MCV: 80.9 fL (ref 78.0–100.0)
Platelets: 212 10*3/uL (ref 150–400)
RBC: 3.66 MIL/uL — ABNORMAL LOW (ref 3.87–5.11)
RDW: 15.9 % — AB (ref 11.5–15.5)
WBC: 6.9 10*3/uL (ref 4.0–10.5)

## 2015-03-21 LAB — BRAIN NATRIURETIC PEPTIDE: B Natriuretic Peptide: 279.9 pg/mL — ABNORMAL HIGH (ref 0.0–100.0)

## 2015-03-21 MED ORDER — FUROSEMIDE 10 MG/ML IJ SOLN
60.0000 mg | Freq: Two times a day (BID) | INTRAMUSCULAR | Status: AC
  Administered 2015-03-21 – 2015-03-22 (×3): 60 mg via INTRAVENOUS
  Filled 2015-03-21 (×4): qty 6

## 2015-03-21 NOTE — Progress Notes (Addendum)
[facility] PROGRESS NOTE  Brenda Cook ZOX:[id number] DOB: [birth year] DOA: 03/14/2015 PCP: [name], MD  Assessment/Plan: Severe sepsis, present on admission-secondary to Proteus mirabilis acute pyelonephritis/infected left distal ureteral stone - Initially admitted to stepdown unit and treated aggressively with broad-spectrum IV antibiotics, aggressive IV fluid hydration, brief IV steroids - Urine cultures noted to be pos for Proteus mirabilis - Antibiotics were later transitioned to oral Bactrim and will plan total 14 days treatment (end date 03/27/15) due to recent intervention/stent placement.  Proteus mirabilis Acute pyelonephritis secondary to obstructing/infected left distal ureteral stone with left hydronephrosis  - Urology was consulted and patient underwent urgent cystoscopy and left double-J stent placement on 5/19  - Antibiotics per above  - As per urology, patient will eventually need anesthetic ureteroscopy once her medical situation is stabilized and her infection treated.   Hypokalemia - Replaced  Anemia - Slight drop probably d/t dilution or acute illness - FU CBC in am and transfuse if Hb < 7. - Remains stable  Essential hypertension/hypotension - Holding antihypertensives (HCTZ ). - Hypotension resolved with IVF alone. - Resumed Toprol on 5/20 - BP stable and controlled  Paroxysmal A. fib/permanent pacemaker - AV paced rhythm on telemetry - Probably not a candidate for anticoagulation due to advanced age. Continue aspirin - CHA2DS2-VASc score: 6/high risk - resumed low dose BB 5/20 per above  GERD - Continue PPI  Macular degeneration/legally blind and hearing impairment - Stable  Acute diastolic CHF - 2-D echo 2009 showed EF 55-60 percent - Repeat 2-D echo w/ normal findings and normal EF of 55-60% - Likely due to high-volume fluid resuscitation on initial presentation  - Patient developed worsening dyspnea since 5/22.  - CXR with  findings of volume overload - Had been continued on IV Lasix 40 mg every 12 hours, will increase to  - Net pos 1.3L thus far. Total 2.6L of urine out over past 24hrs  Code Status: Can administer ACLS meds only. No intubation/CPR/defibrillation Family Communication: Pt in room, daughter at bedside (indicate person spoken with, relationship, and if by phone, the number) Disposition Plan: Pending   Consultants:  Urology  Procedures:  urgent cystoscopy and left double-J stent placement on 5/19   Foley catheter 5/22 >  Antibiotics:  IV Rocephin 1 dose on 5/18   IV cefepime 5/19 >5/22  IV vancomycin 5/19 >5/20  Oral Bactrim single strength 5/22 >  HPI/Subjective: Still reports feeling sob when talking in full sentences. No other complaints  Objective: Filed Vitals:   03/20/15 0624 03/20/15 1322 03/20/15 2158 03/21/15 0536  BP: 112/58 114/58 120/66 143/52  Pulse: 71 71 70 70  Temp: 98.2 F (36.8 C) 97.5 F (36.4 C) 98.1 F (36.7 C) 98.4 F (36.9 C)  TempSrc: Oral Oral Oral Oral  Resp: Height:      Weight:      SpO2: 94% 97% 97% 93%    Intake/Output Summary (Last 24 hours) at 03/21/15 1704 Last data filed at 03/21/15 1523  Gross per 24 hour  Intake    300 ml  Output   2900 ml  Net  -2600 ml   Filed Weights   03/15/15 0321  Weight: 59.3 kg (130 lb 11.7 oz)    Exam:   General:  Awake, in nad  Cardiovascular: regular, s1, s2  Respiratory: normal resp effort, no wheezing  Abdomen: soft,nondistended  Musculoskeletal: perfused,no clubbing, no LE edema   Data Reviewed: Basic Metabolic Panel:  Recent Labs Lab  03/17/15 0425 03/18/15 0434 03/19/15 0444 03/20/15 0407 03/21/15 0355  NA 138 137 141 141 139  K 3.1* 3.3* 3.0* 3.9 3.5  CL 105 106 105 106 101  CO2 GLUCOSE 133* 114* 108* 110* 106*  BUN 26* 24* 19 22* 25*  CREATININE 0.64 0.54 0.68 0.69 0.90  CALCIUM 9.1 8.8* 9.0 9.2 9.5   Liver Function  Tests:  Recent Labs Lab 03/14/15 2132  AST 31  ALT 24  ALKPHOS 108  BILITOT 0.6  PROT 7.7  ALBUMIN 4.0    Recent Labs Lab 03/14/15 2132  LIPASE 17*   No results for input(s): AMMONIA in the last 168 hours. CBC:  Recent Labs Lab 03/14/15 2132 03/15/15 0640 03/16/15 0325 03/17/15 0425 03/18/15 0434 03/21/15 0355  WBC 16.9* 16.3* 14.8* 10.7* 9.5 6.9  NEUTROABS 15.7*  --   --   --   --   --   HGB 11.8* 9.6* 8.7* 8.6* 8.9* 9.4*  HCT 37.8 30.8* 27.6* 28.0* 27.7* 29.6*  MCV 80.9 80.6 82.1 80.9 79.6 80.9  PLT 232 162 146* 139* 162 212   Cardiac Enzymes: No results for input(s): CKTOTAL, CKMB, CKMBINDEX, TROPONINI in the last 168 hours. BNP (last 3 results)  Recent Labs  03/21/15 0355  BNP 279.9*    ProBNP (last 3 results) No results for input(s): PROBNP in the last 8760 hours.  CBG: No results for input(s): GLUCAP in the last 168 hours.  Recent Results (from the past 240 hour(s))  Urine culture     Status: None   Collection Time: 03/14/15 10:15 PM  Result Value Ref Range Status   Specimen Description URINE, CATHETERIZED  Final   Special Requests NONE  Final   Colony Count   Final    >=100,000 COLONIES/ML Performed at [facility]    Culture   Final    PROTEUS MIRABILIS Performed at [facility]    Report Status 03/17/2015 FINAL  Final   Organism ID, Bacteria PROTEUS MIRABILIS  Final      Susceptibility   Proteus mirabilis - MIC*    AMPICILLIN <=2 SENSITIVE Sensitive     CEFAZOLIN <=4 SENSITIVE Sensitive     CEFTRIAXONE <=1 SENSITIVE Sensitive     CIPROFLOXACIN <=0.25 SENSITIVE Sensitive     GENTAMICIN <=1 SENSITIVE Sensitive     LEVOFLOXACIN <=0.12 SENSITIVE Sensitive     NITROFURANTOIN 128 RESISTANT Resistant     TOBRAMYCIN <=1 SENSITIVE Sensitive     TRIMETH/SULFA <=20 SENSITIVE Sensitive     * PROTEUS MIRABILIS  MRSA PCR Screening     Status: None   Collection Time: 03/15/15  3:22 AM  Result Value Ref Range Status   MRSA by  PCR NEGATIVE NEGATIVE Final    Comment:        The GeneXpert MRSA Assay (FDA approved for NASAL specimens only), is one component of a comprehensive MRSA colonization surveillance program. It is not intended to diagnose MRSA infection nor to guide or monitor treatment for MRSA infections.      Studies: Dg Chest 2 View  03/20/2015   CLINICAL DATA:  Congestive heart failure.  EXAM: CHEST  2 VIEW  COMPARISON:  February 17, 2015.  FINDINGS: Stable cardiomegaly. No pneumothorax is noted. Stable pacemakers are noted bilaterally. Minimal bilateral pleural effusions are noted. Stable bilateral pulmonary edema is noted.  IMPRESSION: Stable bilateral pulmonary edema and minimal pleural effusions are noted concerning for congestive heart failure.   Electronically Signed  By: Lupita RaiderJames  Green Jr, M.D.   On: 03/20/2015 11:44    Scheduled Meds: . aspirin EC  81 mg Oral Q breakfast  . furosemide  60 mg Intravenous BID  . heparin  5,000 Units Subcutaneous 3 times per day  . metoprolol succinate  12.5 mg Oral Daily  . pantoprazole  40 mg Oral Q breakfast  . polyvinyl alcohol   Both Eyes BID  . sertraline  100 mg Oral QHS  . sodium chloride  3 mL Intravenous Q12H  . sulfamethoxazole-trimethoprim  1 tablet Oral BID   Continuous Infusions:   Principal Problem:   Pyohydronephrosis Active Problems:   Essential hypertension   Sepsis secondary to UTI   Left ureteral stone   Severe sepsis   Brenda Cook K  [facility] Pager [phone]. If 7PM-7AM, please contact night-coverage at [email or web address], password [email or web address] 03/21/2015, 5:04 PM  LOS: 7 days

## 2015-03-22 ENCOUNTER — Inpatient Hospital Stay (HOSPITAL_COMMUNITY): Payer: Medicare Other

## 2015-03-22 DIAGNOSIS — N3000 Acute cystitis without hematuria: Secondary | ICD-10-CM

## 2015-03-22 LAB — BASIC METABOLIC PANEL
ANION GAP: 12 (ref 5–15)
BUN: 26 mg/dL — AB (ref 6–20)
CALCIUM: 9.6 mg/dL (ref 8.9–10.3)
CHLORIDE: 100 mmol/L — AB (ref 101–111)
CO2: 29 mmol/L (ref 22–32)
Creatinine, Ser: 1.04 mg/dL — ABNORMAL HIGH (ref 0.44–1.00)
GFR calc non Af Amer: 46 mL/min — ABNORMAL LOW (ref >60)
GFR, EST AFRICAN AMERICAN: 53 mL/min — AB (ref >60)
Glucose, Bld: 119 mg/dL — ABNORMAL HIGH (ref 65–99)
POTASSIUM: 3.6 mmol/L (ref 3.5–5.1)
Sodium: 141 mmol/L (ref 135–145)

## 2015-03-22 MED ORDER — FUROSEMIDE 40 MG PO TABS
40.0000 mg | ORAL_TABLET | Freq: Every day | ORAL | Status: DC
  Filled 2015-03-22: qty 1

## 2015-03-22 MED ORDER — ENSURE ENLIVE PO LIQD
237.0000 mL | Freq: Two times a day (BID) | ORAL | Status: DC
  Administered 2015-03-22 – 2015-03-25 (×6): 237 mL via ORAL

## 2015-03-22 MED ORDER — SERTRALINE HCL 100 MG PO TABS
100.0000 mg | ORAL_TABLET | ORAL | Status: DC
  Administered 2015-03-22 – 2015-03-24 (×3): 100 mg via ORAL
  Filled 2015-03-22 (×4): qty 1

## 2015-03-22 NOTE — Progress Notes (Signed)
[facility] PROGRESS NOTE  Brenda Cook ZOX:[id number] DOB: 01-16-1921 DOA: 03/14/2015 PCP: [name], MD  Assessment/Plan: Severe sepsis, present on admission-secondary to Proteus mirabilis acute pyelonephritis/infected left distal ureteral stone - Initially admitted to stepdown unit and treated aggressively with broad-spectrum IV antibiotics, aggressive IV fluid hydration, brief stress dosed steroids - Urine cultures noted to be pos for Proteus mirabilis - Antibiotics have been transitioned to oral Bactrim and will plan total 14 days treatment (end date 03/27/15) secondary to recent intervention/stent placement.  Proteus mirabilis Acute pyelonephritis secondary to obstructing/infected left distal ureteral stone with left hydronephrosis  - Urology was consulted and patient underwent urgent cystoscopy and left double-J stent placement on 5/19  - Antibiotics per above  - Ultimately, patient will eventually need anesthetic ureteroscopy once her medical situation is stabilized and her infection treated.   Hypokalemia - Replaced  Anemia - Slight drop probably d/t dilution or acute illness - FU CBC in am and transfuse if Hb < 7. - Hgb thus far remains stable  Essential hypertension/hypotension - Holding antihypertensives (HCTZ ). - Hypotension had resolved with IVF alone. - Resumed Toprol on 5/20 - BP since remained stable and controlled  Paroxysmal A. fib/permanent pacemaker - AV paced rhythm on telemetry - Probably not a candidate for anticoagulation due to advanced age. Continue aspirin - CHA2DS2-VASc score: 6/high risk - resumed low dose BB 5/20 per above  GERD - Continue PPI  Macular degeneration/legally blind and hearing impairment - Remains stable  Acute diastolic CHF - 2-D echo 2009 showed EF 55-60 percent - Repeat 2-D echo on this admit w/ normal findings and normal EF of 55-60% - Patient developed worsening dyspnea since 5/22.  - CXR with findings  of volume overload - Pt is continued on lasix  IV bid - Net pos 500cc thus far - Would transition to PO lasix tomorrow  Code Status: Can administer ACLS meds only. No intubation/CPR/defibrillation Family Communication: Pt in room Disposition Plan: Pending   Consultants:  Urology  Procedures:  urgent cystoscopy and left double-J stent placement on 5/19   Foley catheter 5/22 >  Antibiotics:  IV Rocephin 1 dose on 5/18   IV cefepime 5/19 >5/22  IV vancomycin 5/19 >5/20  Oral Bactrim single strength 5/22 >  HPI/Subjective: States feeling better today. No complaints  Objective: Filed Vitals:   03/21/15 2152 03/22/15 0644 03/22/15 0940 03/22/15 1142  BP: 129/74 118/74 120/54   Pulse: 68 70 70   Temp: 98.1 F (36.7 C) 98.3 F (36.8 C)    TempSrc: Oral Oral    Resp: 16 20    Height:      Weight:    61.417 kg (135 lb 6.4 oz)  SpO2: 93% 94%      Intake/Output Summary (Last 24 hours) at 03/22/15 1708 Last data filed at 03/22/15 1619  Gross per 24 hour  Intake     50 ml  Output   1375 ml  Net  -1325 ml   Filed Weights   03/15/15 0321 03/22/15 1142  Weight: 59.3 kg (130 lb 11.7 oz) 61.417 kg (135 lb 6.4 oz)    Exam:   General:  Awake, laying in bed, in nad  Cardiovascular: regular, s1, s2  Respiratory: normal resp effort, no wheezing  Abdomen: soft,nondistended, pos BS  Musculoskeletal: perfused,no clubbing, no LE edema   Data Reviewed: Basic Metabolic Panel:  Recent Labs Lab 03/18/15 0434 03/19/15 0444 03/20/15 0407 03/21/15 0355 03/22/15 0405  NA 137 141 141 139 141  K 3.3* 3.0* 3.9 3.5 3.6  CL 106 105 106 101 100*  CO2 24 27 28 28 29   GLUCOSE 114* 108* 110* 106* 119*  BUN 24* 19 22* 25* 26*  CREATININE 0.54 0.68 0.69 0.90 1.04*  CALCIUM 8.8* 9.0 9.2 9.5 9.6   Liver Function Tests: No results for input(s): AST, ALT, ALKPHOS, BILITOT, PROT, ALBUMIN in the last 168 hours. No results for input(s): LIPASE, AMYLASE in the last 168  hours. No results for input(s): AMMONIA in the last 168 hours. CBC:  Recent Labs Lab 03/16/15 0325 03/17/15 0425 03/18/15 0434 03/21/15 0355  WBC 14.8* 10.7* 9.5 6.9  HGB 8.7* 8.6* 8.9* 9.4*  HCT 27.6* 28.0* 27.7* 29.6*  MCV 82.1 80.9 79.6 80.9  PLT 146* 139* 162 212   Cardiac Enzymes: No results for input(s): CKTOTAL, CKMB, CKMBINDEX, TROPONINI in the last 168 hours. BNP (last 3 results)  Recent Labs  03/21/15 0355  BNP 279.9*    ProBNP (last 3 results) No results for input(s): PROBNP in the last 8760 hours.  CBG: No results for input(s): GLUCAP in the last 168 hours.  Recent Results (from the past 240 hour(s))  Urine culture     Status: None   Collection Time: 03/14/15 10:15 PM  Result Value Ref Range Status   Specimen Description URINE, CATHETERIZED  Final   Special Requests NONE  Final   Colony Count   Final    >=100,000 COLONIES/ML [facility]    Culture   Final    PROTEUS MIRABILIS [facility]formed at [facility]    Report Status 03/17/2015 FINAL  Final   Organism ID, Bacteria PROTEUS MIRABILIS  Final      Susceptibility   Proteus mirabilis - MIC*    AMPICILLIN <=2 SENSITIVE Sensitive     CEFAZOLIN <=4 SENSITIVE Sensitive     CEFTRIAXONE <=1 SENSITIVE Sensitive     CIPROFLOXACIN <=0.25 SENSITIVE Sensitive     GENTAMICIN <=1 SENSITIVE Sensitive     LEVOFLOXACIN <=0.12 SENSITIVE Sensitive     NITROFURANTOIN 128 RESISTANT Resistant     TOBRAMYCIN <=1 SENSITIVE Sensitive     TRIMETH/SULFA <=20 SENSITIVE Sensitive     * PROTEUS MIRABILIS  MRSA PCR Screening     Status: None   Collection Time: 03/15/15  3:22 AM  Result Value Ref Range Status   MRSA by PCR NEGATIVE NEGATIVE Final    Comment:        The GeneXpert MRSA Assay (FDA approved for NASAL specimens only), is one component of a comprehensive MRSA colonization surveillance program. It is not intended to diagnose MRSA infection nor to guide or monitor treatment  for MRSA infections.      Studies: Dg Chest Port 1 View  03/22/2015   CLINICAL DATA:  History of stroke.  CHF.  EXAM: PORTABLE CHEST - 1 VIEW  COMPARISON:  03/20/2015  FINDINGS: There is a left chest wall pacer device with lead in the right atrial appendage and right ventricle. Right chest wall pacer device is also noted with leads in the right atrial appendage and right ventricle. Moderate cardiac enlargement is identified. Pulmonary edema and small effusions are unchanged.  IMPRESSION: 1. Stable CHF pattern.   Electronically Signed   By: Signa Kellaylor  Stroud M.D.   On: 03/22/2015 12:40    Scheduled Meds: . aspirin EC  81 mg Oral Q breakfast  . feeding supplement (ENSURE ENLIVE)  237 mL Oral BID BM  . furosemide  60 mg Intravenous BID  .  heparin  5,000 Units Subcutaneous 3 times per day  . metoprolol succinate  12.5 mg Oral Daily  . pantoprazole  40 mg Oral Q breakfast  . polyvinyl alcohol   Both Eyes BID  . sertraline  100 mg Oral Q24H  . sodium chloride  3 mL Intravenous Q12H  . sulfamethoxazole-trimethoprim  1 tablet Oral BID   Continuous Infusions:   Principal Problem:   Pyohydronephrosis Active Problems:   Essential hypertension   Sepsis secondary to UTI   Left ureteral stone   Severe sepsis   CHIU, STEPHEN K  [facility] Pager [phone]. If 7PM-7AM, please contact night-coverage at [email or web address], password [facility] 03/22/2015, 5:08 PM  LOS: 8 days

## 2015-03-22 NOTE — Progress Notes (Signed)
Nutrition Follow-up  INTERVENTION:  Ensure Enlive (each supplement provides 350kcal and 20 grams of protein) BID Encourage PO intake RD to continue to monitor  NUTRITION DIAGNOSIS:  Inadequate oral intake related to acute illness as evidenced by other (see comment) (poor po).  Ongoing.  GOAL:  Patient will meet greater than or equal to 90% of their needs  Unmet.  MONITOR:  PO intake, Supplement acceptance, Labs, Weight trends  ASSESSMENT: 81 y.o. female who presents to the ED with LLQ and left flank pain, N/V, multiple episodes of NBNB vomiting. Small amount of diarrhea. 5/19: pt s/p Cystoscopy, left double-J stent placement  Pt not eating as well today. Pt asleep during visit. Per RN, pt did not eat all of her omelette d/t it being greasy. PO intake:50%. Pt did sip on Ensure supplement provided by RN.  Labs reviewed: Elevated BUN & Creatinine  Height:  Ht Readings from Last 1 Encounters:  03/15/15 5\' 3"  (1.6 m)    Weight:  Wt Readings from Last 1 Encounters:  03/22/15 135 lb 6.4 oz (61.417 kg)    Ideal Body Weight:  52.3 kg  Wt Readings from Last 10 Encounters:  03/22/15 135 lb 6.4 oz (61.417 kg)  03/06/15 128 lb (58.06 kg)  10/04/14 142 lb (64.411 kg)  06/15/14 142 lb (64.411 kg)  02/20/14 146 lb (66.225 kg)  12/23/12 142 lb 6.4 oz (64.592 kg)  12/17/12 138 lb (62.596 kg)  07/14/12 128 lb (58.06 kg)  01/16/12 128 lb (58.06 kg)  12/24/11 128 lb (58.06 kg)    BMI:  Body mass index is 23.99 kg/(m^2).  Estimated Nutritional Needs:  Kcal:  1500-1700  Protein:  75-85 g  Fluid:  1.7 L/day     Skin:  Reviewed, no issues  Diet Order:  Diet regular Room service appropriate?: Yes; Fluid consistency:: Thin; Fluid restriction:: 1200 mL Fluid  EDUCATION NEEDS:  No education needs identified at this time   Intake/Output Summary (Last 24 hours) at 03/22/15 1231 Last data filed at 03/22/15 1006  Gross per 24 hour  Intake    290 ml  Output   1575  ml  Net  -1285 ml    Last BM:  5/25  Brenda FrancoLindsey Corean Yoshimura, MS, RD, LDN Pager: [phone] After Hours Pager: [phone]

## 2015-03-22 NOTE — Care Management Note (Signed)
Case Management Note  Patient Details  Name: Brenda Cook MRN: [id number] Date of Birth: 1921/07/05  Subjective/Objective:     81 yo admitted with pyohydronephrosis               Action/Plan: From home with daughter  Expected Discharge Date:  03/19/15               Expected Discharge Plan:  Home w Home Health Services  In-House Referral:  NA  Discharge planning Services  CM Consult  Post Acute Care Choice:  Home Health Choice offered to:  Adult Children  DME Arranged:    DME Agency:     HH Arranged:    HH Agency:     Status of Service:  In process, will continue to follow  Medicare Important Message Given:    Date Medicare IM Given:    Medicare IM give by:    Date Additional Medicare IM Given:    Additional Medicare Important Message give by:     If discussed at Long Length of Stay Meetings, dates discussed:    Additional Comments: Please see ED CM note:  This CM spoke with daughter [name] via phone to follow up with disposition planning. Pt [facility] [place] and is unsure that [facility] would [facility]. Daughter concerned about pt not walking for past couple of months. This CM encouraged daughter to speak with MD about concerns and see if MD will order for PT/OT to re-evaluate pt. Daughter states that she will do this. Daughter states she does not have [facility] to her by the [facility]. This CM left [facility] provider list and private duty agency list in pt's room and will follow up with daughter for choice for [facility] services. Will need MD order [facility] [place]. No other CM needs noted at this time. Bartholome BillCLEMENTS, Refugia Laneve H, RN 03/22/2015, 1:59 PM

## 2015-03-23 LAB — BASIC METABOLIC PANEL
ANION GAP: 11 (ref 5–15)
BUN: 35 mg/dL — ABNORMAL HIGH (ref 6–20)
CALCIUM: 9.5 mg/dL (ref 8.9–10.3)
CHLORIDE: 97 mmol/L — AB (ref 101–111)
CO2: 29 mmol/L (ref 22–32)
CREATININE: 0.9 mg/dL (ref 0.44–1.00)
GFR calc Af Amer: >60 mL/min (ref >60)
GFR calc non Af Amer: 54 mL/min — ABNORMAL LOW (ref >60)
Glucose, Bld: 103 mg/dL — ABNORMAL HIGH (ref 65–99)
Potassium: 3.4 mmol/L — ABNORMAL LOW (ref 3.5–5.1)
Sodium: 137 mmol/L (ref 135–145)

## 2015-03-23 LAB — MAGNESIUM: Magnesium: 1.9 mg/dL (ref 1.7–2.4)

## 2015-03-23 MED ORDER — POTASSIUM CHLORIDE CRYS ER 20 MEQ PO TBCR
40.0000 meq | EXTENDED_RELEASE_TABLET | Freq: Two times a day (BID) | ORAL | Status: AC
  Administered 2015-03-23 (×2): 40 meq via ORAL
  Filled 2015-03-23 (×2): qty 2

## 2015-03-23 MED ORDER — FUROSEMIDE 10 MG/ML IJ SOLN
40.0000 mg | Freq: Two times a day (BID) | INTRAMUSCULAR | Status: DC
Start: 2015-03-23 — End: 2015-03-25
  Administered 2015-03-23 – 2015-03-25 (×5): 40 mg via INTRAVENOUS
  Filled 2015-03-23 (×7): qty 4

## 2015-03-23 MED ORDER — NYSTATIN 100000 UNIT/GM EX CREA
TOPICAL_CREAM | Freq: Two times a day (BID) | CUTANEOUS | Status: DC
  Administered 2015-03-23 – 2015-03-25 (×4): via TOPICAL
  Filled 2015-03-23: qty 15

## 2015-03-23 MED ORDER — FLUCONAZOLE 150 MG PO TABS
150.0000 mg | ORAL_TABLET | Freq: Once | ORAL | Status: AC
  Administered 2015-03-23: 150 mg via ORAL
  Filled 2015-03-23: qty 1

## 2015-03-23 MED ORDER — SENNOSIDES-DOCUSATE SODIUM 8.6-50 MG PO TABS
1.0000 | ORAL_TABLET | Freq: Every evening | ORAL | Status: DC | PRN
  Administered 2015-03-23 – 2015-03-24 (×2): 1 via ORAL
  Filled 2015-03-23 (×2): qty 1

## 2015-03-23 NOTE — Progress Notes (Signed)
8 Days Post-Op Subjective: Patient looks good. Conversive, comfortable. Foley catheter still in.  Objective: Vital signs in last 24 hours: Temp:  [97.8 F (36.6 C)-98.2 F (36.8 C)] 97.8 F (36.6 C) (05/27 0512) Pulse Rate:  [54-72] 54 (05/27 0512) Resp:  [18] 18 (05/27 0512) BP: (103-120)/(50-72) 103/51 mmHg (05/27 0512) SpO2:  [93 %-97 %] 93 % (05/27 0512) Weight:  [61.417 kg (135 lb 6.4 oz)] 61.417 kg (135 lb 6.4 oz) (05/26 1142)  Intake/Output from previous day: 05/26 0701 - 05/27 0700 In: 180 [P.O.:170; I.V.:10] Out: 1800 [Urine:1800] Intake/Output this shift:    Physical Exam:  Constitutional: Vital signs reviewed. WD WN in NAD   Eyes: PERRL, No scleral icterus.   Pulmonary/Chest: Normal effort  Extremities: No cyanosis or edema   Lab Results:  Recent Labs  03/21/15 0355  HGB 9.4*  HCT 29.6*   BMET  Recent Labs  03/22/15 0405 03/23/15 0525  NA 141 137  K 3.6 3.4*  CL 100* 97*  CO2 29 29  GLUCOSE 119* 103*  BUN 26* 35*  CREATININE 1.04* 0.90  CALCIUM 9.6 9.5   No results for input(s): LABPT, INR in the last 72 hours. No results for input(s): LABURIN in the last 72 hours. Results for orders placed or performed during the hospital encounter of 03/14/15  Urine culture     Status: None   Collection Time: 03/14/15 10:15 PM  Result Value Ref Range Status   Specimen Description URINE, CATHETERIZED  Final   Special Requests NONE  Final   Colony Count   Final    >=100,000 COLONIES/ML Performed at [facility]    Culture   Final    PROTEUS MIRABILIS Performed at [facility]    Report Status 03/17/2015 FINAL  Final   Organism ID, Bacteria PROTEUS MIRABILIS  Final      Susceptibility   Proteus mirabilis - MIC*    AMPICILLIN <=2 SENSITIVE Sensitive     CEFAZOLIN <=4 SENSITIVE Sensitive     CEFTRIAXONE <=1 SENSITIVE Sensitive     CIPROFLOXACIN <=0.25 SENSITIVE Sensitive     GENTAMICIN <=1 SENSITIVE Sensitive     LEVOFLOXACIN <=0.12  SENSITIVE Sensitive     NITROFURANTOIN 128 RESISTANT Resistant     TOBRAMYCIN <=1 SENSITIVE Sensitive     TRIMETH/SULFA <=20 SENSITIVE Sensitive     * PROTEUS MIRABILIS  MRSA PCR Screening     Status: None   Collection Time: 03/15/15  3:22 AM  Result Value Ref Range Status   MRSA by PCR NEGATIVE NEGATIVE Final    Comment:        The GeneXpert MRSA Assay (FDA approved for NASAL specimens only), is one component of a comprehensive MRSA colonization surveillance program. It is not intended to diagnose MRSA infection nor to guide or monitor treatment for MRSA infections.     Studies/Results: Dg Chest Port 1 View  03/22/2015   CLINICAL DATA:  History of stroke.  CHF.  EXAM: PORTABLE CHEST - 1 VIEW  COMPARISON:  03/20/2015  FINDINGS: There is a left chest wall pacer device with lead in the right atrial appendage and right ventricle. Right chest wall pacer device is also noted with leads in the right atrial appendage and right ventricle. Moderate cardiac enlargement is identified. Pulmonary edema and small effusions are unchanged.  IMPRESSION: 1. Stable CHF pattern.   Electronically Signed   By: Signa Kell M.D.   On: 03/22/2015 12:40    Assessment/Plan:   Status post double-J  stent placement last week for infected, obstructing left distal ureteral stone, growing Proteus. She is clinically improving.    I think it's okay to take her Foley catheter out. I will order bedside commode.    She will eventually need a second procedure-cystoscopy, retrograde, ureteroscopic stone extraction. It's okay to wait quite a few weeks for this, after her improvement. We will arrange follow-up.    LOS: 9 days   Marcine MatarDAHLSTEDT, Ragena Fiola M 03/23/2015, 9:20 AM

## 2015-03-23 NOTE — Progress Notes (Signed)
[facility] PROGRESS NOTE  Brenda Cook ZOX:[id number] DOB: 1921/10/07 DOA: 03/14/2015 PCP: [name], MD  Assessment/Plan: Severe sepsis, present on admission-secondary to Proteus mirabilis acute pyelonephritis/infected left distal ureteral stone - Clinically improved with broad-spectrum IV antibiotics, aggressive IV fluid hydration, brief stress dosed steroids - Urine culture was positive for Proteus mirabilis - Antibiotics have been transitioned to oral Bactrim with plans for total of 14 days treatment (end date 03/27/15) secondary to recent intervention/stent placement.  Proteus mirabilis Acute pyelonephritis secondary to obstructing/infected left distal ureteral stone with left hydronephrosis  - Urology was consulted and patient underwent urgent cystoscopy and left double-J stent placement on 5/19  - Antibiotics per above  - Ultimately, patient will eventually need anesthetic ureteroscopy once her medical situation is more stable and her infection cleared.   Hypokalemia - Replaced  Anemia - Slight drop probably d/t dilution or acute illness - Hgb thus far remains stable  Essential hypertension/hypotension - Holding antihypertensives (HCTZ ). - Hypotension had resolved with IVF alone. - Resumed Toprol on 5/20 - BP since remained stable and controlled  Paroxysmal A. fib/permanent pacemaker - AV paced rhythm was noted on telemetry - Probably not a candidate for anticoagulation due to advanced age. Continue aspirin - CHA2DS2-VASc score: 6/high risk - resumed low dose BB 5/20 per above  GERD - Continue PPI  Macular degeneration/legally blind and hearing impairment - Remains stable  Acute diastolic CHF - 2-D echo 2009 showed EF 55-60 percent with repeat 2-D echo on this admit w/ normal findings and normal EF of 55-60% - During this admit, the patient developed worsening dyspnea since 5/22 after aggressive volume resuscitation  - Serial CXR with findings of  volume overload - Pt is continued on lasix  IV bid - Net pos 300cc currently - When patient begins to show signs of being dry, would transition to PO lasix  Code Status: Can administer ACLS meds only. No intubation/CPR/defibrillation Family Communication: Pt in room Disposition Plan: Pending   Consultants:  Urology  Procedures:  urgent cystoscopy and left double-J stent placement on 5/19  Foley catheter 5/22 >  Antibiotics:  IV Rocephin 1 dose on 5/18   IV cefepime 5/19 >5/22  IV vancomycin 5/19 >5/20  Oral Bactrim single strength 5/22 >  HPI/Subjective: States breathing better today. No complaints.  Objective: Filed Vitals:   03/22/15 1400 03/22/15 2042 03/23/15 0512 03/23/15 0946  BP: 114/72 112/50 103/51 102/54  Pulse: 72 71 54 64  Temp: 98.2 F (36.8 C) 98.1 F (36.7 C) 97.8 F (36.6 C)   TempSrc: Oral Oral Oral   Resp: Height:      Weight:      SpO2: 94% 97% 93%     Intake/Output Summary (Last 24 hours) at 03/23/15 1443 Last data filed at 03/23/15 1335  Gross per 24 hour  Intake    373 ml  Output   2000 ml  Net  -1627 ml   Filed Weights   03/15/15 0321 03/22/15 1142  Weight: 59.3 kg (130 lb 11.7 oz) 61.417 kg (135 lb 6.4 oz)    Exam:   General:  Awake, laying in bed, in nad  Cardiovascular: regular, s1, s2  Respiratory: normal resp effort, no wheezing  Abdomen: soft,nondistended, nontender  Musculoskeletal: perfused,no clubbing  Data Reviewed: Basic Metabolic Panel:  Recent Labs Lab 03/19/15 0444 03/20/15 0407 03/21/15 0355 03/22/15 0405 03/23/15 0525  NA 141 141 139 141 137  K 3.0* 3.9 3.5 3.6 3.4*  CL 105 106 101 100* 97*  CO2 GLUCOSE 108* 110* 106* 119* 103*  BUN 19 22* 25* 26* 35*  CREATININE 0.68 0.69 0.90 1.04* 0.90  CALCIUM 9.0 9.2 9.5 9.6 9.5  MG  --   --   --   --  1.9   Liver Function Tests: No results for input(s): AST, ALT, ALKPHOS, BILITOT, PROT, ALBUMIN in the last 168  hours. No results for input(s): LIPASE, AMYLASE in the last 168 hours. No results for input(s): AMMONIA in the last 168 hours. CBC:  Recent Labs Lab 03/17/15 0425 03/18/15 0434 03/21/15 0355  WBC 10.7* 9.5 6.9  HGB 8.6* 8.9* 9.4*  HCT 28.0* 27.7* 29.6*  MCV 80.9 79.6 80.9  PLT 139* 162 212   Cardiac Enzymes: No results for input(s): CKTOTAL, CKMB, CKMBINDEX, TROPONINI in the last 168 hours. BNP (last 3 results)  Recent Labs  03/21/15 0355  BNP 279.9*    ProBNP (last 3 results) No results for input(s): PROBNP in the last 8760 hours.  CBG: No results for input(s): GLUCAP in the last 168 hours.  Recent Results (from the past 240 hour(s))  Urine culture     Status: None   Collection Time: 03/14/15 10:15 PM  Result Value Ref Range Status   Specimen Description URINE, CATHETERIZED  Final   Special Requests NONE  Final   Colony Count   Final    >=100,000 COLONIES/ML Performed at [facility]    Culture   Final    PROTEUS MIRABILIS Performed at [facility]    Report Status 03/17/2015 FINAL  Final   Organism ID, Bacteria PROTEUS MIRABILIS  Final      Susceptibility   Proteus mirabilis - MIC*    AMPICILLIN <=2 SENSITIVE Sensitive     CEFAZOLIN <=4 SENSITIVE Sensitive     CEFTRIAXONE <=1 SENSITIVE Sensitive     CIPROFLOXACIN <=0.25 SENSITIVE Sensitive     GENTAMICIN <=1 SENSITIVE Sensitive     LEVOFLOXACIN <=0.12 SENSITIVE Sensitive     NITROFURANTOIN 128 RESISTANT Resistant     TOBRAMYCIN <=1 SENSITIVE Sensitive     TRIMETH/SULFA <=20 SENSITIVE Sensitive     * PROTEUS MIRABILIS  MRSA PCR Screening     Status: None   Collection Time: 03/15/15  3:22 AM  Result Value Ref Range Status   MRSA by PCR NEGATIVE NEGATIVE Final    Comment:        The GeneXpert MRSA Assay (FDA approved for NASAL specimens only), is one component of a comprehensive MRSA colonization surveillance program. It is not intended to diagnose MRSA infection nor to guide  or monitor treatment for MRSA infections.      Studies: Dg Chest Port 1 View  03/22/2015   CLINICAL DATA:  History of stroke.  CHF.  EXAM: PORTABLE CHEST - 1 VIEW  COMPARISON:  03/20/2015  FINDINGS: There is a left chest wall pacer device with lead in the right atrial appendage and right ventricle. Right chest wall pacer device is also noted with leads in the right atrial appendage and right ventricle. Moderate cardiac enlargement is identified. Pulmonary edema and small effusions are unchanged.  IMPRESSION: 1. Stable CHF pattern.   Electronically Signed   By: Signa Kell M.D.   On: 03/22/2015 12:40    Scheduled Meds: . aspirin EC  81 mg Oral Q breakfast  . feeding supplement (ENSURE ENLIVE)  237 mL Oral BID BM  . furosemide  40 mg Intravenous  BID  . heparin  5,000 Units Subcutaneous 3 times per day  . metoprolol succinate  12.5 mg Oral Daily  . pantoprazole  40 mg Oral Q breakfast  . polyvinyl alcohol   Both Eyes BID  . potassium chloride  40 mEq Oral BID  . sertraline  100 mg Oral Q24H  . sodium chloride  3 mL Intravenous Q12H  . sulfamethoxazole-trimethoprim  1 tablet Oral BID   Continuous Infusions:   Principal Problem:   Pyohydronephrosis Active Problems:   Essential hypertension   Sepsis secondary to UTI   Left ureteral stone   Severe sepsis   Junita Kubota K  [facility] Pager [phone]. If 7PM-7AM, please contact night-coverage at [email or web address], password [email or web address] 03/23/2015, 2:43 PM  LOS: 9 days

## 2015-03-23 NOTE — Evaluation (Signed)
Occupational Therapy Evaluation Patient Details Name: Brenda Cook MRN: [id number] DOB: [birth year] Today's Date: 03/23/2015    History of Present Illness Brenda Cook is a 81 y.o. female who presents to the ED with LLQ and left flank pain, N/V,  Dx polynephritis;  PMHx:  HTN, afib, pacemaker. S/P cysto with stent placed on R on 5.21.16   Clinical Impression   Pt admitted with above.  Phoned and spoke with daughter, who reports pt was total A with all ADLs except self feeding (min - mod A).  They use hoyer lift at home for functional transfers, and bedpan for toileting needs.  Pt has been non ambulatory for some time.  Pt is currently appears very close to her baseline level of functioning.  At present she requires max A for self feeding and total A for all other aspects of ADLs.  Daugther reports they have all needed DME.  No further acute OT needs identified, however, recommend HHOT for home recommendations as well as to determine if pt has the ability to regain some level of independence with ADLs.  Daughter agrees with this plan     Follow Up Recommendations  Home health OT;Supervision/Assistance - 24 hour    Equipment Recommendations  None recommended by OT    Recommendations for Other Services       Precautions / Restrictions Precautions Precautions: Fall Restrictions Weight Bearing Restrictions: No      Mobility Bed Mobility Overal bed mobility: Needs Assistance Bed Mobility: Rolling     Supine to sit: Max assist Sit to supine: Max assist   General bed mobility comments: multimodal cues to roll side to side, hand over hand to rech for rail. Did not attempt sitting at this time, patieints catheter apparantly leaking and bed soaked with urine.  Transfers                 General transfer comment: did not attempt     Balance Overall balance assessment: Needs assistance Sitting-balance support: Feet supported Sitting balance-Leahy Scale: Poor Sitting  balance - Comments: Pt required mod A to sit EOB.  Pt requesting to return to supine due to nausea  Postural control: Right lateral lean;Posterior lean                                  ADL Overall ADL's : Needs assistance/impaired Eating/Feeding: Maximal assistance;Bed level   Grooming: Wash/dry hands;Wash/dry face;Oral care;Bed level;Total assistance   Upper Body Bathing: Total assistance   Lower Body Bathing: Total assistance   Upper Body Dressing : Total assistance   Lower Body Dressing: Total assistance   Toilet Transfer: Total assistance Toilet Transfer Details (indicate cue type and reason): unable  Toileting- Clothing Manipulation and Hygiene: Total assistance       Functional mobility during ADLs: Total assistance General ADL Comments: Pt moved to EOB.  Requires max cues for participation.  Pt frequently states "I can't".        Vision Additional Comments: Pt reports she is able to see outline of objects.  Is unable to detect color    Perception     Praxis      Pertinent Vitals/Pain Pain Assessment: No/denies pain     Hand Dominance Right   Extremity/Trunk Assessment Upper Extremity Assessment Upper Extremity Assessment: Generalized weakness   Lower Extremity Assessment Lower Extremity Assessment: Generalized weakness   Cervical / Trunk Assessment Cervical / Trunk Assessment: Kyphotic  Communication Communication Communication: No difficulties   Cognition Arousal/Alertness: Awake/alert Behavior During Therapy: WFL for tasks assessed/performed Overall Cognitive Status: No family/caregiver present to determine baseline cognitive functioning                     General Comments       Exercises       Shoulder Instructions      Home Living Family/patient expects to be discharged to:: Private residence Living Arrangements: Children (daughter ) Available Help at Discharge: Family;Available 24 hours/day Type of Home:  House Home Access: Ramped entrance     Home Layout: One level               Home Equipment: Wheelchair - manual;Other (comment) (hoyer lift, adjustable bed )   Additional Comments: no family available      Prior Functioning/Environment Level of Independence: Needs assistance  Gait / Transfers Assistance Needed: Pt does not stand and has been non ambulatory for some time.  Daughter and son in law use hoyer for transfers  ADL's / Homemaking Assistance Needed: Pt is able to feed herself ~50% of meal once daughter sets her up.  Otherwise she is total A.  Pt utilizes a bedpan for toileting,        OT Diagnosis: Generalized weakness;Cognitive deficits;Disturbance of vision   OT Problem List: Decreased strength;Decreased activity tolerance;Impaired balance (sitting and/or standing);Impaired vision/perception;Decreased coordination;Decreased cognition;Decreased safety awareness;Decreased knowledge of use of DME or AE   OT Treatment/Interventions:      OT Goals(Current goals can be found in the care plan section) Acute Rehab OT Goals Patient Stated Goal: none stated  OT Frequency:     Barriers to D/C:            Co-evaluation              End of Session Equipment Utilized During Treatment: Oxygen Nurse Communication: Mobility status  Activity Tolerance: Patient limited by fatigue;Other (comment) (nausea ) Patient left: in bed;with call bell/phone within reach;with bed alarm set   Time: 1610-9604 OT Time Calculation (min): 15 min Charges:  OT General Charges $OT Visit: 1 Procedure OT Evaluation $Initial OT Evaluation Tier I: 1 Procedure G-Codes:    Chrystal Zeimet, Ursula Alert M April 03, 2015, 3:06 PM

## 2015-03-23 NOTE — Care Management Note (Signed)
Case Management Note  Patient Details  Name: Brenda Cook MRN: [id number] Date of Birth: 06/17/1921               Expected Discharge Date:  03/19/15               Expected Discharge Plan:  Home w Home Health Services  In-House Referral:  NA  Discharge planning Services  CM Consult  Post Acute Care Choice:  Home Health Choice offered to:  Adult Children  DME Arranged:    DME Agency:     HH Arranged:    HH Agency:     Status of Service:  In process, will continue to follow  Medicare Important Message Given:  Yes Date Medicare IM Given:  03/23/15 Medicare IM give by:  Sandford CrazeNora Parrie Rasco RN,BSN,NCM Date Additional Medicare IM Given:    Additional Medicare Important Message give by:     If discussed at Long Length of Stay Meetings, dates discussed:    Additional Comments: This CM [facility].  Still awaiting re-evaluation by PT for home recommendations. PT order written.   Previous PT eval indicated that pt did not need PT follow up at home. If re-evaluation differs and PT recommends HHPT then pt [facility] [facility]. CM will continue to follow.  Bartholome BillCLEMENTS, Angeldejesus Callaham H, RN 03/23/2015, 1:19 PM

## 2015-03-23 NOTE — Evaluation (Signed)
Physical Therapy Evaluation Patient Details Name: TONISHIA STEFFY MRN: [id number] DOB: [birth year] Today's Date: 03/23/2015   History of Present Illness  Brenda Cook is a 81 y.o. female who presents to the ED with LLQ and left flank pain, N/V;  PMHx:  HTN, afib, pacemaker. S/P cysto with stent placed on R on 5.21.16  Clinical Impression  Patient was evaluated 1 week ago, deemed total care. PT  reordered, patient remains at total care level and lift for OOB.. No family present. PT will follow for trial until goals of care are determined.  Case management notes indicate home with  [facility], also note daughter reports patient has been non ambulatory PTA. Patient has been in hospital x 9 days.Recommend mechanical lift.  Follow Up Recommendations Home health PT;Supervision/Assistance - 24 hour    Equipment Recommendations  Hospital bed (mechanical lift if family unable to  assist.)    Recommendations for Other Services       Precautions / Restrictions Precautions Precautions: Fall Restrictions Weight Bearing Restrictions: No      Mobility  Bed Mobility   Bed Mobility: Rolling           General bed mobility comments: multimodal cues to roll side to side, hand over hand to rech for rail. Did not attempt sitting at this time, patieints catheter apparantly leaking and bed soaked with urine.  Transfers                    Ambulation/Gait                Stairs            Wheelchair Mobility    Modified Rankin (Stroke Patients Only)       Balance                                             Pertinent Vitals/Pain Pain Assessment: No/denies pain    Home Living                   Additional Comments: no family available    Prior Function                 Hand Dominance        Extremity/Trunk Assessment   Upper Extremity Assessment: Generalized weakness           Lower Extremity Assessment: Generalized  weakness         Communication      Cognition Arousal/Alertness: Awake/alert Behavior During Therapy: WFL for tasks assessed/performed Overall Cognitive Status: No family/caregiver present to determine baseline cognitive functioning                      General Comments      Exercises        Assessment/Plan    PT Assessment Patient needs continued PT services (uncertain goals for patient who was total care. Notes from care manager indicate that daughter is concerned that patient has not wallked for months PTA. aPotential to return to  functioanl level is guarded.)  PT Diagnosis Generalized weakness   PT Problem List Decreased strength;Decreased range of motion;Decreased activity tolerance;Decreased mobility  PT Treatment Interventions Functional mobility training;Patient/family education (trial)   PT Goals (Current goals can be found in the Care Plan section) Acute Rehab PT Goals Patient Stated Goal: none stated  PT Goal Formulation: Patient unable to participate in goal setting Time For Goal Achievement: 04/06/15 Potential to Achieve Goals: Fair    Frequency Min 2X/week   Barriers to discharge        Co-evaluation               End of Session   Activity Tolerance: Patient tolerated treatment well Patient left: in bed;with bed alarm set;with call bell/phone within reach Nurse Communication: Need for lift equipment         Time: [id number] PT Time Calculation (min) (ACUTE ONLY): 18 min   Charges:   PT Evaluation $Initial PT Evaluation Tier I: 1 Procedure     PT G CodesRada Hay:        Sukhraj Esquivias Elizabeth 03/23/2015, 3:00 PM Blanchard KelchKaren Eleanore Junio PT [phone]

## 2015-03-24 LAB — BASIC METABOLIC PANEL
ANION GAP: 11 (ref 5–15)
BUN: 37 mg/dL — AB (ref 6–20)
CALCIUM: 9.8 mg/dL (ref 8.9–10.3)
CO2: 29 mmol/L (ref 22–32)
CREATININE: 0.81 mg/dL (ref 0.44–1.00)
Chloride: 99 mmol/L — ABNORMAL LOW (ref 101–111)
GFR calc non Af Amer: >60 mL/min (ref >60)
Glucose, Bld: 100 mg/dL — ABNORMAL HIGH (ref 65–99)
Potassium: 4.5 mmol/L (ref 3.5–5.1)
Sodium: 139 mmol/L (ref 135–145)

## 2015-03-24 LAB — CBC
HEMATOCRIT: 28.9 % — AB (ref 36.0–46.0)
HEMOGLOBIN: 8.8 g/dL — AB (ref 12.0–15.0)
MCH: 25 pg — ABNORMAL LOW (ref 26.0–34.0)
MCHC: 30.4 g/dL (ref 30.0–36.0)
MCV: 82.1 fL (ref 78.0–100.0)
Platelets: 230 10*3/uL (ref 150–400)
RBC: 3.52 MIL/uL — ABNORMAL LOW (ref 3.87–5.11)
RDW: 16.3 % — AB (ref 11.5–15.5)
WBC: 8.1 10*3/uL (ref 4.0–10.5)

## 2015-03-24 NOTE — Progress Notes (Signed)
[facility] PROGRESS NOTE  Brenda Cook:[id number] DOB: 1921/08/20 DOA: 03/14/2015 PCP: [name], MD  Assessment/Plan: Severe sepsis, present on admission-secondary to Proteus mirabilis acute pyelonephritis/infected left distal ureteral stone - Sepsis resolved with broad-spectrum IV antibiotics, aggressive IV fluid hydration, brief stress dosed steroids - Urine culture was positive for Proteus mirabilis - Antibiotics have been transitioned to oral Bactrim with plans for total of 14 days treatment (end date 03/27/15) secondary to recent intervention/stent placement.  Proteus mirabilis Acute pyelonephritis secondary to obstructing/infected left distal ureteral stone with left hydronephrosis  - Urology was consulted and patient underwent urgent cystoscopy and left double-J stent placement on 5/19  - Antibiotics per above  - Ultimately, patient will eventually need anesthetic ureteroscopy once her medical situation is more stable and her infection cleared.   Hypokalemia - Replaced  Anemia - Slight drop probably d/t dilution or acute illness - Hgb remained stable  Essential hypertension/hypotension - Holding HCTZ  - Hypotension had resolved with IVF alone. - Resumed Toprol on 5/20 - BP since remained stable and controlled  Paroxysmal A. fib/permanent pacemaker - AV paced rhythm was noted on telemetry - Probably not a candidate for anticoagulation due to advanced age. Continue aspirin - CHA2DS2-VASc score: 6/high risk - resumed low dose BB 5/20 per above  GERD - Continue PPI  Macular degeneration/legally blind and hearing impairment - Remains stable  Acute diastolic CHF - 2-D echo 2009 showed EF 55-60 percent with repeat 2-D echo on this admit w/ normal findings and normal EF of 55-60% - During this admit, the patient developed worsening dyspnea since 5/22 after aggressive volume resuscitation  - Serial CXR with findings noted to have volume overload - Pt  remains on lasix  IV bid - from this point on, I/O inaccurate as pt no longer has foley cath. - Cont to monitor and when patient begins to show signs of being dry, would transition to PO lasix  Code Status: Can administer ACLS meds only. No intubation/CPR/defibrillation Family Communication: Pt in room, daughter at bedside Disposition Plan: Pending   Consultants:  Urology  Procedures:  urgent cystoscopy and left double-J stent placement on 5/19  Foley catheter 5/22 >  Antibiotics:  IV Rocephin 1 dose on 5/18   IV cefepime 5/19 >5/22  IV vancomycin 5/19 >5/20  Oral Bactrim single strength 5/22 >  HPI/Subjective: Eager to go home. No complaints today. States breathing better today  Objective: Filed Vitals:   03/24/15 0547 03/24/15 0930 03/24/15 1115 03/24/15 1300  BP: 116/67 133/56  129/63  Pulse: 69 73  70  Temp: 98.1 F (36.7 C)   98 F (36.7 C)  TempSrc: Oral   Oral  Resp:    18  Height:      Weight:   58.106 kg (128 lb 1.6 oz)   SpO2: 95%   95%    Intake/Output Summary (Last 24 hours) at 03/24/15 1428 Last data filed at 03/24/15 0900  Gross per 24 hour  Intake    790 ml  Output    300 ml  Net    490 ml   Filed Weights   03/24/15 1115  Weight: 58.106 kg (128 lb 1.6 oz)    Exam:   General:  Awake, sitting in chair, in nad  Cardiovascular: regular, s1, s2  Respiratory: normal resp effort, no wheezing  Abdomen: soft,nondistended, nontender, pos BS  Musculoskeletal: perfused,no clubbing  Data Reviewed: Basic Metabolic Panel:  Recent Labs Lab 03/20/15 0407 03/21/15 0355 03/22/15 0405 03/23/15  0525 03/24/15 0352  NA 141 139 141 137 139  K 3.9 3.5 3.6 3.4* 4.5  CL 106 101 100* 97* 99*  CO2 GLUCOSE 110* 106* 119* 103* 100*  BUN 22* 25* 26* 35* 37*  CREATININE 0.69 0.90 1.04* 0.90 0.81  CALCIUM 9.2 9.5 9.6 9.5 9.8  MG  --   --   --  1.9  --    Liver Function Tests: No results for input(s): AST, ALT, ALKPHOS,  BILITOT, PROT, ALBUMIN in the last 168 hours. No results for input(s): LIPASE, AMYLASE in the last 168 hours. No results for input(s): AMMONIA in the last 168 hours. CBC:  Recent Labs Lab 03/18/15 0434 03/21/15 0355 03/24/15 0352  WBC 9.5 6.9 8.1  HGB 8.9* 9.4* 8.8*  HCT 27.7* 29.6* 28.9*  MCV 79.6 80.9 82.1  PLT 162 212 230   Cardiac Enzymes: No results for input(s): CKTOTAL, CKMB, CKMBINDEX, TROPONINI in the last 168 hours. BNP (last 3 results)  Recent Labs  03/21/15 0355  BNP 279.9*    ProBNP (last 3 results) No results for input(s): PROBNP in the last 8760 hours.  CBG: No results for input(s): GLUCAP in the last 168 hours.  Recent Results (from the past 240 hour(s))  Urine culture     Status: None   Collection Time: 03/14/15 10:15 PM  Result Value Ref Range Status   Specimen Description URINE, CATHETERIZED  Final   Special Requests NONE  Final   Colony Count   Final    >=100,000 COLONIES/ML Performed at [facility]    Culture   Final    PROTEUS MIRABILIS Performed at [facility]    Report Status 03/17/2015 FINAL  Final   Organism ID, Bacteria PROTEUS MIRABILIS  Final      Susceptibility   Proteus mirabilis - MIC*    AMPICILLIN <=2 SENSITIVE Sensitive     CEFAZOLIN <=4 SENSITIVE Sensitive     CEFTRIAXONE <=1 SENSITIVE Sensitive     CIPROFLOXACIN <=0.25 SENSITIVE Sensitive     GENTAMICIN <=1 SENSITIVE Sensitive     LEVOFLOXACIN <=0.12 SENSITIVE Sensitive     NITROFURANTOIN 128 RESISTANT Resistant     TOBRAMYCIN <=1 SENSITIVE Sensitive     TRIMETH/SULFA <=20 SENSITIVE Sensitive     * PROTEUS MIRABILIS  MRSA PCR Screening     Status: None   Collection Time: 03/15/15  3:22 AM  Result Value Ref Range Status   MRSA by PCR NEGATIVE NEGATIVE Final    Comment:        The GeneXpert MRSA Assay (FDA approved for NASAL specimens only), is one component of a comprehensive MRSA colonization surveillance program. It is not intended to  diagnose MRSA infection nor to guide or monitor treatment for MRSA infections.      Studies: No results found.  Scheduled Meds: . aspirin EC  81 mg Oral Q breakfast  . feeding supplement (ENSURE ENLIVE)  237 mL Oral BID BM  . furosemide  40 mg Intravenous BID  . heparin  5,000 Units Subcutaneous 3 times per day  . metoprolol succinate  12.5 mg Oral Daily  . nystatin cream   Topical BID  . pantoprazole  40 mg Oral Q breakfast  . polyvinyl alcohol   Both Eyes BID  . sertraline  100 mg Oral Q24H  . sodium chloride  3 mL Intravenous Q12H  . sulfamethoxazole-trimethoprim  1 tablet Oral BID   Continuous Infusions:   Principal Problem:  Pyohydronephrosis Active Problems:   Essential hypertension   Sepsis secondary to UTI   Left ureteral stone   Severe sepsis   Kristinia Leavy K  [facility] Pager [phone]. If 7PM-7AM, please contact night-coverage at [email or web address], password [email or web address] 03/24/2015, 2:28 PM  LOS: 10 days

## 2015-03-24 NOTE — Plan of Care (Signed)
Problem: Phase I Progression Outcomes Goal: Other Phase I Outcomes/Goals Outcome: Progressing Patient incontinent of urine at baseline. Foley d/c'd on 5/27 and patient has "soaked" the bed pads several times.

## 2015-03-24 NOTE — Plan of Care (Signed)
Problem: Phase I Progression Outcomes Goal: OOB as tolerated unless otherwise ordered Outcome: Not Applicable Date Met:  27/73/75 Patient is bedbound/wheelchair bound at home. Unable to stand. oob at home with use of a lift.

## 2015-03-24 NOTE — Progress Notes (Signed)
Patient with a yeast rash in perineal/groin/rectal area-paged NP on call and orders received for nystatin and 150 mg diflucan x1

## 2015-03-25 LAB — BASIC METABOLIC PANEL
ANION GAP: 10 (ref 5–15)
BUN: 37 mg/dL — AB (ref 6–20)
CO2: 28 mmol/L (ref 22–32)
CREATININE: 1.06 mg/dL — AB (ref 0.44–1.00)
Calcium: 10.1 mg/dL (ref 8.9–10.3)
Chloride: 98 mmol/L — ABNORMAL LOW (ref 101–111)
GFR calc Af Amer: 52 mL/min — ABNORMAL LOW (ref >60)
GFR calc non Af Amer: 44 mL/min — ABNORMAL LOW (ref >60)
GLUCOSE: 108 mg/dL — AB (ref 65–99)
Potassium: 4.2 mmol/L (ref 3.5–5.1)
Sodium: 136 mmol/L (ref 135–145)

## 2015-03-25 MED ORDER — POLYETHYLENE GLYCOL 3350 17 G PO PACK
17.0000 g | PACK | Freq: Every day | ORAL | Status: DC
  Administered 2015-03-25: 17 g via ORAL
  Filled 2015-03-25: qty 1

## 2015-03-25 MED ORDER — POLYETHYLENE GLYCOL 3350 17 G PO PACK: 17.0000 g | PACK | Freq: Every day | ORAL | Status: AC

## 2015-03-25 MED ORDER — DOCUSATE SODIUM 100 MG PO CAPS
100.0000 mg | ORAL_CAPSULE | Freq: Every day | ORAL | Status: DC
  Administered 2015-03-25: 100 mg via ORAL
  Filled 2015-03-25: qty 1

## 2015-03-25 MED ORDER — DOCUSATE SODIUM 100 MG PO CAPS: 100.0000 mg | ORAL_CAPSULE | Freq: Every day | ORAL | Status: DC

## 2015-03-25 MED ORDER — SULFAMETHOXAZOLE-TRIMETHOPRIM 400-80 MG PO TABS: 1.0000 | ORAL_TABLET | Freq: Two times a day (BID) | ORAL | Status: DC

## 2015-03-25 MED ORDER — FUROSEMIDE 40 MG PO TABS: 40.0000 mg | ORAL_TABLET | Freq: Every day | ORAL | Status: DC

## 2015-03-25 NOTE — Clinical Social Work Note (Signed)
CSW received a call from RN requesting pt ambulance home today  CSW met with pt and daughter to confirm address, prepared discharge packet, provided it to RN and called for ambulance transport home  .Dede Query, LCSW [facility] Clinical Social Worker - Weekend Coverage cell #: [phone]

## 2015-03-25 NOTE — Discharge Summary (Addendum)
Physician Discharge Summary  Brenda GarretHelen C Cook ZOX:[id number] DOB: 1921-07-25 DOA: 03/14/2015  PCP: Thora LanceEHINGER,ROBERT R, MD  Admit date: 03/14/2015 Discharge date: 03/25/2015  Time spent: 20 minutes  Recommendations for Outpatient Follow-up:  1. Follow up with PCP in 1-2 weeks 2. Follow up with Dr. Retta Dionesahlstedt, to be scheduled 3. Recommend repeating renal panel within 1-2 weeks  Discharge Diagnoses:  Principal Problem:   Pyohydronephrosis Active Problems:   Essential hypertension   Sepsis secondary to UTI   Left ureteral stone   Severe sepsis  Discharge Condition: Improved  Diet recommendation: Regular  Filed Weights   03/24/15 1115 03/25/15 0556  Weight: 58.106 kg (128 lb 1.6 oz) 59.149 kg (130 lb 6.4 oz)    History of present illness:  Please see admit h and p from 5/19 for details. Briefly, pt presented with abd pains with findings of sepsis with UTI. The patient was admitted for further work up.  Hospital Course:  Severe sepsis, present on admission-secondary to Proteus mirabilis acute pyelonephritis/infected left distal ureteral stone - Sepsis resolved with broad-spectrum IV antibiotics, aggressive IV fluid hydration, brief stress dosed steroids - Urine culture was positive for Proteus mirabilis - Antibiotics have been transitioned to oral Bactrim with plans for total of 14 days treatment (end date 03/27/15) secondary to recent intervention/stent placement.  Proteus mirabilis Acute pyelonephritis secondary to obstructing/infected left distal ureteral stone with left hydronephrosis  - Urology was consulted and patient underwent urgent cystoscopy and left double-J stent placement on 5/19  - Antibiotics per above  - Ultimately, patient will eventually need anesthetic ureteroscopy once her medical situation is more stable and her infection cleared.  - Foley was d/c'd  Hypokalemia - Replaced  Anemia - Slight drop probably d/t dilution or acute illness - Hgb remained  stable  Essential hypertension/hypotension - Holding HCTZ  - Hypotension had resolved with IVF alone. - Resumed Toprol on 5/20 - BP since remained stable and controlled  Paroxysmal A. fib/permanent pacemaker - AV paced rhythm was noted on telemetry - Considered not a candidate for anticoagulation due to advanced age. Continue aspirin - CHA2DS2-VASc score: 6/high risk - resumed low dose BB 5/20 per above  GERD - Continue PPI  Macular degeneration/legally blind and hearing impairment - Remained stable  Acute diastolic CHF - 2-D echo 2009 showed EF 55-60 percent with repeat 2-D echo on this admit w/ normal findings and normal EF of 55-60% - During this admit, the patient developed worsening dyspnea since 5/22 after aggressive volume resuscitation  - Serial CXR with findings noted to have volume overload - Pt was continued on IV lasix with resolution of volume overload - Pt transitioned to PO lasix on discharge - Would repeat renal panel in 1-2 weeks  Procedures:  urgent cystoscopy and left double-J stent placement on 5/19  Foley catheter 5/22 >  Consultations:  Urology  Discharge Exam: Filed Vitals:   03/24/15 1300 03/24/15 2015 03/25/15 0556 03/25/15 0950  BP: 129/63 134/64 127/61 121/63  Pulse: 70 70 70 74  Temp: 98 F (36.7 C) 97.5 F (36.4 C) 98.2 F (36.8 C)   TempSrc: Oral Oral Oral   Resp: 18 18 18    Height:      Weight:   59.149 kg (130 lb 6.4 oz)   SpO2: 95% 97% 98%     General: Awake, in nad Cardiovascular: regular, s1, s2 Respiratory: normal resp effort, no wheezing  Discharge Instructions     Medication List    STOP taking these medications  hydrochlorothiazide 25 MG tablet  Commonly known as:  HYDRODIURIL      TAKE these medications        aspirin EC 81 MG tablet  Take 81 mg by mouth daily with breakfast.     b complex vitamins tablet  Take 1 tablet by mouth at bedtime.     CALCIUM 600+D 600-800 MG-UNIT Tabs  Generic  drug:  Calcium Carb-Cholecalciferol  Take 1 tablet by mouth 2 (two) times daily.     diphenhydrAMINE 25 MG tablet  Commonly known as:  SOMINEX  Take 25 mg by mouth at bedtime as needed for sleep.     docusate sodium 100 MG capsule  Commonly known as:  COLACE  Take 1 capsule (100 mg total) by mouth daily.     ESTER-C 500-200-60 MG Tabs  Take 1 tablet by mouth at bedtime.     FISH OIL PO  Take 1,290 mg by mouth 2 (two) times daily.     FLAXSEED OIL PO  Take 2,400 mg by mouth 2 (two) times daily.     furosemide 40 MG tablet  Commonly known as:  LASIX  Take 1 tablet (40 mg total) by mouth daily.     ICAPS PO  Take 1 capsule by mouth daily at 2 PM daily at 2 PM.     metoprolol succinate 25 MG 24 hr tablet  Commonly known as:  TOPROL-XL  TAKE ONE-HALF (1/2) TABLET DAILY     multivitamin with minerals Tabs tablet  Take 1 tablet by mouth at bedtime.     pantoprazole 40 MG tablet  Commonly known as:  PROTONIX  Take 40 mg by mouth daily with breakfast.     polyethylene glycol packet  Commonly known as:  MIRALAX / GLYCOLAX  Take 17 g by mouth daily.     potassium chloride SA 20 MEQ tablet  Commonly known as:  K-DUR,KLOR-CON  Take 20 mEq by mouth daily.     sertraline 100 MG tablet  Commonly known as:  ZOLOFT  Take 100 mg by mouth at bedtime.     sulfamethoxazole-trimethoprim 400-80 MG per tablet  Commonly known as:  BACTRIM,SEPTRA  Take 1 tablet by mouth 2 (two) times daily.     SYSTANE OP  Place 1 drop into both eyes 2 (two) times daily.     Vitamin D-3 5000 UNITS Tabs  Take 1 tablet by mouth daily with breakfast.       Allergies  Allergen Reactions  . Depakote [Valproic Acid] Other (See Comments)    disorientation  . Benicar [Olmesartan Medoxomil] Other (See Comments)    Not effective   Follow-up Information    Schedule an appointment as soon as possible for a visit with [name], MD.   Specialty:  Family Medicine   Why:  Hospital follow up    Contact information:   [address] [place] [address] [phone]       Follow up with Chelsea Aus, MD.   Specialty:  Urology   Why:  you will be contacted to scheduled an appointment   Contact information:   [address] [place] [address] [phone]       Follow up with [facility].   Why:  Home Health Physical Therapy, Occupational Therapy and aide   Contact information:   [address] [place] [address] [phone]        The results of significant diagnostics from this hospitalization (  including imaging, microbiology, ancillary and laboratory) are listed below for reference.    Significant Diagnostic Studies: Ct Abdomen Pelvis Wo Contrast  03/14/2015   CLINICAL DATA:  Lower abdominal pain, nausea, vomiting and diarrhea for 1 day.  EXAM: CT ABDOMEN AND PELVIS WITHOUT CONTRAST  TECHNIQUE: Multidetector CT imaging of the abdomen and pelvis was performed following the standard protocol without IV contrast.  COMPARISON:  CT 07/28/2011  FINDINGS: Evaluation of the lung bases demonstrates cardiomegaly and coronary artery calcifications. Pacemaker is partially included. There is significant tortuosity of the distal descending thoracic aorta. Chronic changes noted at the lung bases with pleural-parenchymal scarring.  There is a moderate hiatal hernia, mildly progressed in size compared to prior exam. The unenhanced liver, gallbladder, spleen, and right adrenal gland are normal. The left adrenal gland is suboptimally defined, there is soft tissue stranding in the left upper quadrant in the region of the adrenal gland. Pancreas is atrophic without surrounding inflammatory change.  There is a 4 mm obstructing stone at the left ureterovesicular junction with resultant mild hydronephrosis. Perinephric stranding noted about the left kidney. Additional punctate nonobstructing stones in the mid and lower left kidney. There is thinning of  the renal parenchyma. Thinning of the right renal parenchyma without hydronephrosis. Kidneys are both inferiorly located and slightly ectatic in appearance due to scoliosis.  The stomach is decompressed. There are no dilated or thickened bowel loops. Small volume of stool throughout the colon. There are scattered colonic diverticula without diverticulitis. Detailed bowel evaluation is limited given mild patient motion.  No retroperitoneal adenopathy. Abdominal aorta is tortuous in its course with moderate atherosclerosis. No aneurysm. No retroperitoneal adenopathy. Small fat containing umbilical hernia. No free air, free fluid, or intra-abdominal fluid collection. Appendix is not identified.  Within the pelvis the urinary bladder is near completely decompressed. Uterus is surgically absent. No adnexal mass. No pelvic free fluid. No inguinal adenopathy. There is fat in both inguinal canals.  The bones are under mineralized. There is moderate scoliotic curvature of the thoracolumbar spine with associated degenerative change. Compression deformity of T11 is again seen. Degenerative change about both hips, pubic symphysis, and sacroiliac joints. There are no acute or suspicious osseous abnormalities.  IMPRESSION: 1. Obstructing 4 mm stone at the left ureterovesicular junction with resultant mild left hydronephrosis and perinephric stranding. Stranding extends to the region of the left adrenal gland. 2. Chronic findings include hiatal hernia, atherosclerosis, small fat containing umbilical hernia, and chronic T11 compression fracture.   Electronically Signed   By: Rubye Oaks M.D.   On: 03/14/2015 23:22   Dg Chest 2 View  03/20/2015   CLINICAL DATA:  Congestive heart failure.  EXAM: CHEST  2 VIEW  COMPARISON:  February 17, 2015.  FINDINGS: Stable cardiomegaly. No pneumothorax is noted. Stable pacemakers are noted bilaterally. Minimal bilateral pleural effusions are noted. Stable bilateral pulmonary edema is noted.   IMPRESSION: Stable bilateral pulmonary edema and minimal pleural effusions are noted concerning for congestive heart failure.   Electronically Signed   By: Lupita Raider, M.D.   On: 03/20/2015 11:44   Dg Chest Port 1 View  03/22/2015   CLINICAL DATA:  History of stroke.  CHF.  EXAM: PORTABLE CHEST - 1 VIEW  COMPARISON:  03/20/2015  FINDINGS: There is a left chest wall pacer device with lead in the right atrial appendage and right ventricle. Right chest wall pacer device is also noted with leads in the right atrial appendage and right ventricle. Moderate cardiac enlargement  is identified. Pulmonary edema and small effusions are unchanged.  IMPRESSION: 1. Stable CHF pattern.   Electronically Signed   By: Signa Kell M.D.   On: 03/22/2015 12:40   Dg Chest Port 1 View  03/19/2015   CLINICAL DATA:  Respiratory distress, sepsis, history of cardiac dysrhythmia  EXAM: PORTABLE CHEST - 1 VIEW  COMPARISON:  Portable chest x-ray of Mar 18, 2015  FINDINGS: The lungs are adequately inflated. Small bilateral pleural effusions remain. The interstitial markings remain increased. The cardiac silhouette is enlarged. The pulmonary vascularity is more distinct today. The pacemaker is are unchanged in position. There is degenerative and likely post traumatic change of the right shoulder which is stable.  IMPRESSION: CHF superimposed upon COPD. There has been mild interval improvement in the appearance of the pulmonary interstitium. Small bilateral pleural effusions remain.   Electronically Signed   By: David  [place] M.D.   On: 03/19/2015 07:49   Dg Chest Port 1 View  03/18/2015   CLINICAL DATA:  Shortness of breath today.  Weakness.  Lethargy.  EXAM: PORTABLE CHEST - 1 VIEW  COMPARISON:  03/15/2015.  FINDINGS: There is less patient rotation to the right. No gross change in enlargement of the cardiac silhouette and prominence of the pulmonary vasculature. The interstitial markings appear less prominent. Interval patchy  opacity at both lung bases and small bilateral pleural effusions. Stable left subclavian pacemaker leads and right subclavian pacemaker leads, including one broken lead. Diffuse osteopenia. Old, healed right humeral neck fracture.  IMPRESSION: 1. Interval small bilateral pleural effusions and probable alveolar edema at both lung bases. 2. Less prominent interstitial pulmonary edema. 3. Stable cardiomegaly and pulmonary vascular congestion.   Electronically Signed   By: Beckie Salts M.D.   On: 03/18/2015 10:24   Dg Chest Port 1 View  03/15/2015   CLINICAL DATA:  CHF.  Vomiting.  Upper abdominal pain.  EXAM: PORTABLE CHEST - 1 VIEW  COMPARISON:  10/04/2014  FINDINGS: The patient is rotated. Lower lung volumes from prior exam. Enlargement of the cardiac silhouette with large hiatal hernia. Left-sided pacemaker overlying the chest wall with intact leads. Right-sided pacemaker with disruption of inferior lead, unchanged. Increased interstitial opacity from prior suspicious for pulmonary edema. No pleural effusion. Probable fibrotic change at the lung bases.  IMPRESSION: Interstitial opacities, suspect pulmonary edema. Low lung volumes with cardiomegaly. Findings may reflect CHF.   Electronically Signed   By: Rubye Oaks M.D.   On: 03/15/2015 01:17    Microbiology: No results found for this or any previous visit (from the past 240 hour(s)).   Labs: Basic Metabolic Panel:  Recent Labs Lab 03/21/15 0355 03/22/15 0405 03/23/15 0525 03/24/15 0352 03/25/15 0456  NA 139 141 137 139 136  K 3.5 3.6 3.4* 4.5 4.2  CL 101 100* 97* 99* 98*  CO2 GLUCOSE 106* 119* 103* 100* 108*  BUN 25* 26* 35* 37* 37*  CREATININE 0.90 1.04* 0.90 0.81 1.06*  CALCIUM 9.5 9.6 9.5 9.8 10.1  MG  --   --  1.9  --   --    Liver Function Tests: No results for input(s): AST, ALT, ALKPHOS, BILITOT, PROT, ALBUMIN in the last 168 hours. No results for input(s): LIPASE, AMYLASE in the last 168 hours. No results  for input(s): AMMONIA in the last 168 hours. CBC:  Recent Labs Lab 03/21/15 0355 03/24/15 0352  WBC 6.9 8.1  HGB 9.4* 8.8*  HCT 29.6* 28.9*  MCV 80.9  82.1  PLT 212 230   Cardiac Enzymes: No results for input(s): CKTOTAL, CKMB, CKMBINDEX, TROPONINI in the last 168 hours. BNP: BNP (last 3 results)  Recent Labs  03/21/15 0355  BNP 279.9*    ProBNP (last 3 results) No results for input(s): PROBNP in the last 8760 hours.  CBG: No results for input(s): GLUCAP in the last 168 hours.   Signed:  Satya Bohall K  [facility] 03/25/2015, 6:00 PM

## 2015-03-25 NOTE — Care Management Note (Addendum)
Case Management Note  Patient Details  Name: Brenda Cook MRN: [id number] Date of Birth: 06-24-1921      Expected Discharge Date:  03/19/15               Expected Discharge Plan:  Home w Home Health Services  In-House Referral:  NA, Clinical Social Work  Discharge planning Services  CM Consult  Post Acute Care Choice:  Home Health Choice offered to:     DME Arranged:    DME Agency:     HH Arranged:  PT, OT, Nurse's Aide [facility]  Status of Service:  Completed, signed off  Medicare Important Message Given:  Yes Date Medicare IM Given:  03/23/15 Medicare IM give by:  Sandford CrazeNora Clements RN,BSN,NCM Date Additional Medicare IM Given:  03/25/15 Additional Medicare Important Message give by:  Isidoro DonningAlesia Savahna Casados RN  If discussed at Long Length of Stay Meetings, dates discussed:    Additional Comments:  NCM spoke to dtr, Mickie HillierShirley Harris # [phone], she states pt has a bed at home that the head lifts. She does not want hospital bed at this time. Explained to dtr that pt's PCP can order DME if she decides in the future to have hospital bed. [facility]ided [facility].  [facility]. [facility].  Elliot CousinShavis, [place] Greb Ellen, RN 03/25/2015, 10:18 AM

## 2015-03-25 NOTE — Progress Notes (Signed)
Discharge instructions given to patient's daughter,verbalized understanding. Prescriptions given. Awaiting for the non emergency ambulance to transfer her home.

## 2015-03-25 NOTE — Progress Notes (Signed)
Patient d/c home via non emergency ambulance. Stable.No c/o pain.

## 2015-05-15 ENCOUNTER — Other Ambulatory Visit: Payer: Self-pay | Admitting: Cardiology

## 2015-05-28 ENCOUNTER — Ambulatory Visit
Admission: RE | Admit: 2015-05-28 | Discharge: 2015-05-28 | Disposition: A | Discharge status: Home / Self Care | Payer: Medicare Other | Source: Ambulatory Visit | Attending: Family Medicine | Admitting: Family Medicine

## 2015-05-28 ENCOUNTER — Other Ambulatory Visit: Payer: Self-pay | Admitting: Family Medicine

## 2015-05-28 DIAGNOSIS — M5489 Other dorsalgia: Secondary | ICD-10-CM

## 2015-07-03 ENCOUNTER — Encounter: Payer: Self-pay | Admitting: *Deleted

## 2015-07-09 ENCOUNTER — Telehealth: Payer: Self-pay | Admitting: Internal Medicine

## 2015-07-09 NOTE — Telephone Encounter (Signed)
New message      Daughter have a general question about pacemaker and patients as they get older.  Please call

## 2015-07-09 NOTE — Telephone Encounter (Signed)
Spoke to pt's POA/daughter, Brenda Cook. She was concerned about ICD therapies, updated her device cannot defibrillate her mother. Updated Brenda Cook that device has approx 2.47yrs remaining longevity. Let her know if pt does approach her own end of life, pacemaker will not be "turned off." Also updated her that pacemaker will not artificially keep her mother alive if death occurs. Brenda Cook expressed understanding and gratitude for clarity.   ROV w/ Dr. Johney Frame 08/13/15. (overdue)

## 2015-07-19 ENCOUNTER — Inpatient Hospital Stay (HOSPITAL_COMMUNITY): Payer: Medicare Other

## 2015-07-19 ENCOUNTER — Emergency Department (HOSPITAL_COMMUNITY): Payer: Medicare Other

## 2015-07-19 ENCOUNTER — Inpatient Hospital Stay (HOSPITAL_COMMUNITY)
Admission: EM | Admit: 2015-07-19 | Discharge: 2015-07-26 | DRG: 690 | Disposition: A | Discharge status: Hospice | Payer: Medicare Other | Attending: Internal Medicine | Admitting: Internal Medicine

## 2015-07-19 ENCOUNTER — Encounter (HOSPITAL_COMMUNITY): Payer: Self-pay | Admitting: *Deleted

## 2015-07-19 DIAGNOSIS — I482 Chronic atrial fibrillation: Secondary | ICD-10-CM | POA: Diagnosis present

## 2015-07-19 DIAGNOSIS — F0390 Unspecified dementia without behavioral disturbance: Secondary | ICD-10-CM | POA: Diagnosis present

## 2015-07-19 DIAGNOSIS — N390 Urinary tract infection, site not specified: Secondary | ICD-10-CM | POA: Diagnosis present

## 2015-07-19 DIAGNOSIS — I4891 Unspecified atrial fibrillation: Secondary | ICD-10-CM | POA: Diagnosis present

## 2015-07-19 DIAGNOSIS — Z66 Do not resuscitate: Secondary | ICD-10-CM | POA: Diagnosis present

## 2015-07-19 DIAGNOSIS — Z79891 Long term (current) use of opiate analgesic: Secondary | ICD-10-CM | POA: Diagnosis not present

## 2015-07-19 DIAGNOSIS — R05 Cough: Secondary | ICD-10-CM | POA: Diagnosis present

## 2015-07-19 DIAGNOSIS — R630 Anorexia: Secondary | ICD-10-CM | POA: Diagnosis present

## 2015-07-19 DIAGNOSIS — Z7982 Long term (current) use of aspirin: Secondary | ICD-10-CM

## 2015-07-19 DIAGNOSIS — R130 Aphagia: Secondary | ICD-10-CM | POA: Diagnosis present

## 2015-07-19 DIAGNOSIS — E875 Hyperkalemia: Secondary | ICD-10-CM | POA: Diagnosis not present

## 2015-07-19 DIAGNOSIS — R627 Adult failure to thrive: Secondary | ICD-10-CM | POA: Diagnosis present

## 2015-07-19 DIAGNOSIS — E876 Hypokalemia: Secondary | ICD-10-CM | POA: Diagnosis not present

## 2015-07-19 DIAGNOSIS — H9190 Unspecified hearing loss, unspecified ear: Secondary | ICD-10-CM | POA: Diagnosis present

## 2015-07-19 DIAGNOSIS — Z515 Encounter for palliative care: Secondary | ICD-10-CM

## 2015-07-19 DIAGNOSIS — Z806 Family history of leukemia: Secondary | ICD-10-CM | POA: Diagnosis not present

## 2015-07-19 DIAGNOSIS — N200 Calculus of kidney: Secondary | ICD-10-CM | POA: Diagnosis present

## 2015-07-19 DIAGNOSIS — Z221 Carrier of other intestinal infectious diseases: Secondary | ICD-10-CM | POA: Diagnosis not present

## 2015-07-19 DIAGNOSIS — I442 Atrioventricular block, complete: Secondary | ICD-10-CM | POA: Diagnosis present

## 2015-07-19 DIAGNOSIS — A047 Enterocolitis due to Clostridium difficile: Secondary | ICD-10-CM | POA: Diagnosis present

## 2015-07-19 DIAGNOSIS — K219 Gastro-esophageal reflux disease without esophagitis: Secondary | ICD-10-CM | POA: Diagnosis present

## 2015-07-19 DIAGNOSIS — I5032 Chronic diastolic (congestive) heart failure: Secondary | ICD-10-CM | POA: Diagnosis present

## 2015-07-19 DIAGNOSIS — I110 Hypertensive heart disease with heart failure: Secondary | ICD-10-CM | POA: Diagnosis present

## 2015-07-19 DIAGNOSIS — R197 Diarrhea, unspecified: Secondary | ICD-10-CM | POA: Diagnosis present

## 2015-07-19 DIAGNOSIS — R600 Localized edema: Secondary | ICD-10-CM | POA: Diagnosis present

## 2015-07-19 DIAGNOSIS — Z7189 Other specified counseling: Secondary | ICD-10-CM | POA: Diagnosis not present

## 2015-07-19 DIAGNOSIS — Z8249 Family history of ischemic heart disease and other diseases of the circulatory system: Secondary | ICD-10-CM

## 2015-07-19 DIAGNOSIS — Z888 Allergy status to other drugs, medicaments and biological substances status: Secondary | ICD-10-CM

## 2015-07-19 DIAGNOSIS — Z79899 Other long term (current) drug therapy: Secondary | ICD-10-CM

## 2015-07-19 DIAGNOSIS — Z8673 Personal history of transient ischemic attack (TIA), and cerebral infarction without residual deficits: Secondary | ICD-10-CM | POA: Diagnosis not present

## 2015-07-19 DIAGNOSIS — Z950 Presence of cardiac pacemaker: Secondary | ICD-10-CM

## 2015-07-19 DIAGNOSIS — Z87442 Personal history of urinary calculi: Secondary | ICD-10-CM | POA: Diagnosis not present

## 2015-07-19 DIAGNOSIS — M1990 Unspecified osteoarthritis, unspecified site: Secondary | ICD-10-CM | POA: Diagnosis present

## 2015-07-19 DIAGNOSIS — I495 Sick sinus syndrome: Secondary | ICD-10-CM | POA: Diagnosis present

## 2015-07-19 DIAGNOSIS — D649 Anemia, unspecified: Secondary | ICD-10-CM | POA: Diagnosis present

## 2015-07-19 DIAGNOSIS — I10 Essential (primary) hypertension: Secondary | ICD-10-CM | POA: Diagnosis not present

## 2015-07-19 DIAGNOSIS — Z823 Family history of stroke: Secondary | ICD-10-CM

## 2015-07-19 DIAGNOSIS — E860 Dehydration: Secondary | ICD-10-CM | POA: Diagnosis present

## 2015-07-19 DIAGNOSIS — R531 Weakness: Secondary | ICD-10-CM | POA: Diagnosis present

## 2015-07-19 HISTORY — DX: Urinary tract infection, site not specified: N39.0

## 2015-07-19 LAB — COMPREHENSIVE METABOLIC PANEL
ALT: 11 U/L — AB (ref 14–54)
ANION GAP: 10 (ref 5–15)
AST: 21 U/L (ref 15–41)
Albumin: 3.3 g/dL — ABNORMAL LOW (ref 3.5–5.0)
Alkaline Phosphatase: 72 U/L (ref 38–126)
BUN: 18 mg/dL (ref 6–20)
CALCIUM: 9.8 mg/dL (ref 8.9–10.3)
CHLORIDE: 103 mmol/L (ref 101–111)
CO2: 25 mmol/L (ref 22–32)
Creatinine, Ser: 0.79 mg/dL (ref 0.44–1.00)
Glucose, Bld: 150 mg/dL — ABNORMAL HIGH (ref 65–99)
Potassium: 3.8 mmol/L (ref 3.5–5.1)
SODIUM: 138 mmol/L (ref 135–145)
Total Bilirubin: 0.9 mg/dL (ref 0.3–1.2)
Total Protein: 6.4 g/dL — ABNORMAL LOW (ref 6.5–8.1)

## 2015-07-19 LAB — URINALYSIS, ROUTINE W REFLEX MICROSCOPIC
Glucose, UA: NEGATIVE mg/dL
Ketones, ur: NEGATIVE mg/dL
NITRITE: NEGATIVE
Protein, ur: 30 mg/dL — AB
Specific Gravity, Urine: 1.02 (ref 1.005–1.030)
UROBILINOGEN UA: 1 mg/dL (ref 0.0–1.0)
pH: 6 (ref 5.0–8.0)

## 2015-07-19 LAB — CBC WITH DIFFERENTIAL/PLATELET
Basophils Absolute: 0 10*3/uL (ref 0.0–0.1)
Basophils Relative: 0 %
EOS ABS: 0 10*3/uL (ref 0.0–0.7)
EOS PCT: 0 %
HCT: 31.7 % — ABNORMAL LOW (ref 36.0–46.0)
Hemoglobin: 10.8 g/dL — ABNORMAL LOW (ref 12.0–15.0)
LYMPHS PCT: 8 %
Lymphs Abs: 0.7 10*3/uL (ref 0.7–4.0)
MCH: 29.8 pg (ref 26.0–34.0)
MCHC: 34.1 g/dL (ref 30.0–36.0)
MCV: 87.3 fL (ref 78.0–100.0)
MONO ABS: 0.4 10*3/uL (ref 0.1–1.0)
MONOS PCT: 5 %
Neutro Abs: 8.1 10*3/uL — ABNORMAL HIGH (ref 1.7–7.7)
Neutrophils Relative %: 87 %
PLATELETS: 179 10*3/uL (ref 150–400)
RBC: 3.63 MIL/uL — AB (ref 3.87–5.11)
RDW: 14.5 % (ref 11.5–15.5)
WBC: 9.3 10*3/uL (ref 4.0–10.5)

## 2015-07-19 LAB — URINE MICROSCOPIC-ADD ON

## 2015-07-19 LAB — I-STAT TROPONIN, ED: TROPONIN I, POC: 0.01 ng/mL (ref 0.00–0.08)

## 2015-07-19 LAB — TSH: TSH: 1.107 u[IU]/mL (ref 0.350–4.500)

## 2015-07-19 MED ORDER — DEXTROSE 5 % IV SOLN
1.0000 g | Freq: Once | INTRAVENOUS | Status: AC
  Administered 2015-07-19: 1 g via INTRAVENOUS
  Filled 2015-07-19: qty 10

## 2015-07-19 MED ORDER — SODIUM CHLORIDE 0.9 % IV BOLUS (SEPSIS)
500.0000 mL | Freq: Once | INTRAVENOUS | Status: AC
  Administered 2015-07-19: 500 mL via INTRAVENOUS

## 2015-07-19 MED ORDER — SODIUM CHLORIDE 0.45 % IV SOLN: INTRAVENOUS | Status: DC

## 2015-07-19 MED ORDER — SODIUM CHLORIDE 0.9 % IV BOLUS (SEPSIS)
1000.0000 mL | Freq: Once | INTRAVENOUS | Status: AC
  Administered 2015-07-19: 1000 mL via INTRAVENOUS

## 2015-07-19 NOTE — H&P (Signed)
History and Physical  Brenda Cook  ZOX:[id number]  DOB: 1921/03/09  DOA: 07/19/2015  Referring physician: Melene Plan M.D. PCP: [name], MD   Chief Complaint: "She's not been able to swallow for about 3 weeks and she's been weaker and I started to wonder if she had another UTI."  HPI: Brenda Cook is a 81 y.o. female with a past medical history significant for atrial fibrillation, not on anticoagulation, chronic diastolic heart failure, tachybradycardia syndrome, with pacer, and recent severe sepsis from pyelonephritis in May 2016 who presents with subacute weakness and inability to take anything by mouth.  All history is collected from the patient's daughter who is a reliable historian and with whom she lives. At baseline the patient has not been able to walk since last December. She had a fall around that time, broke her right humerus and was put in rehabilitation. She had an episode of severe sepsis in May of this year when she was found to have a left obstructing stone, a Proteus UTI, and severe sepsis. She subsequently had a stent placed and had an uneventful recovery until the last few weeks.  Now, the daughter describes how the patient returned home from a SNF around July, but over the last 2-3 weeks she has noticed that her mother refuses to eat anything more than a small bite, refuses to take any of her medicines, has been less communicative even than before, and is more weak than usual. The daughter has tried many different consistencies of food but none of them successful. This week she began to suspect that her mother had a UTI, so she brought her to Dr. [name] office, where the urologist performed a urine cath suggested they go to the ER if her mother became sicker. Subsequently her mother started vomiting and so she presented to the ER tonight.  In the ED, the patient was afebrile, and hemodynamically stable. She had no leukocytosis, but a cath urine specimen had  copious white blood cells. She was given IV fluids, started on ceftriaxone and prepared for admission to [facility].   Review of Systems:  Patient seen 11:47 PM on 07/19/2015. Review of systems is not possible due to patient degree of dementia.  Past Medical History  Diagnosis Date  . Hypertension   . Hypertensive cardiovascular disease   . Peripheral edema   . Chronic cough   . GERD (gastroesophageal reflux disease)   . Arthritis   . Atrial fibrillation   . Complete heart block     Has old Thera pacemaker on right side that is turned off and new MDT Versa unit on left  . Stroke   . Urinary tract infection    The above past medical history was reviewed. Past Surgical History  Procedure Laterality Date  . Partial hysterectomy    . Appendectomy    . Tonsillectomy and adenoidectomy    . Insert / replace / remove pacemaker  7/10  . Insert / replace / remove pacemaker  5/79  . Insert / replace / remove pacemaker  7/95  . Shoulder surgery    . Tendon repair  07/20/2012    Procedure: TENDON REPAIR;  Surgeon: Nicki Reaper, MD;  Location: [facility];  Service: Orthopedics;  Laterality: Left;  Reconstruction Extensor hood Left middle finger and left ring finger  . Repair extensor tendon Right 12/17/2012    Procedure: REPAIR EXTENSOR TENDON;  Surgeon: Nicki Reaper, MD;  Location: [facility];  Service: Orthopedics;  Laterality: Right;  CENTRALIZATION EXTENSOR TENDON RIGHT RING FINGER/RIGHT SMALL FINGER ANESTHESIA  . Cystoscopy with stent placement Left 03/15/2015    Procedure: CYSTOSCOPY WITH STENT PLACEMENT left ureter TTEMPTED STONE EXTRACTION with basket;  Surgeon: Marcine Matar, MD;  Location: [facility];  Service: Urology;  Laterality: Left;   The above surgical history was reviewed.  Social History:  reports that she has never smoked. She does not have any smokeless tobacco history on file. She reports that she does not drink alcohol or use illicit  drugs. Patient lives with her daughter. She has never smoked. She is from [place] originally. She has not been able to walk since last December. Her memory is severely impaired.  Allergies  Allergen Reactions  . Depakote [Valproic Acid] Other (See Comments)    disorientation  . Benicar [Olmesartan Medoxomil] Other (See Comments)    Not effective    Family History  Problem Relation Age of Onset  . Stroke Father   . Heart failure Mother   . Leukemia Sister       Prior to Admission medications   Medication Sig Start Date End Date Taking? Authorizing Provider  aspirin EC 81 MG tablet Take 81 mg by mouth daily with breakfast.    Yes Historical Provider, MD  metoprolol succinate (TOPROL-XL) 25 MG 24 hr tablet TAKE ONE-HALF (1/2) TABLET DAILY 07/18/14  Yes Hillis Range, MD  pantoprazole (PROTONIX) 40 MG tablet Take 40 mg by mouth daily with breakfast.    Yes Historical Provider, MD  Polyethyl Glycol-Propyl Glycol (SYSTANE OP) Place 1 drop into both eyes 2 (two) times daily.    Yes Historical Provider, MD  polyethylene glycol (MIRALAX / GLYCOLAX) packet Take 17 g by mouth daily. Patient taking differently: Take 17 g by mouth daily as needed for moderate constipation.  03/25/15  Yes Jerald Kief, MD  QUEtiapine (SEROQUEL) 25 MG tablet Take 50 mg by mouth at bedtime. 06/22/15  Yes Historical Provider, MD  sertraline (ZOLOFT) 100 MG tablet Take 100 mg by mouth daily.    Yes Historical Provider, MD  traMADol (ULTRAM) 50 MG tablet Take 50 mg by mouth every 6 (six) hours as needed for moderate pain. for pain 06/28/15  Yes Historical Provider, MD  docusate sodium (COLACE) 100 MG capsule Take 1 capsule (100 mg total) by mouth daily. Patient not taking: Reported on 07/19/2015 03/25/15   Jerald Kief, MD  furosemide (LASIX) 40 MG tablet Take 1 tablet (40 mg total) by mouth daily. Patient not taking: Reported on 07/19/2015 03/25/15   Jerald Kief, MD  sulfamethoxazole-trimethoprim (BACTRIM,SEPTRA)  400-80 MG per tablet Take 1 tablet by mouth 2 (two) times daily. Patient not taking: Reported on 07/19/2015 03/25/15   Jerald Kief, MD    Physical Exam: BP 144/66 mmHg  Pulse 69  Temp(Src) 97.9 F (36.6 C) (Oral)  Resp 17  Wt 59 kg (130 lb 1.1 oz)  SpO2 98% General: Elderly adult female, sluggish but in no obvious distress.  Responds slowly to questions.  Hard of hearing.  HEENT: Head normal.  Eyes normal.  PERRL and EOMI.  Nose normal.  OP very dry, very dry oral mucosa, without ulcers or redness.  No airway deformities.  No thyromegaly.  Lymph: No cervical or axillary lymphadenopathy. Skin: Warm and dry.  Decreased turgor.  No jaundice.   Cardiac: RRR, nl S1-S2, no murmurs appreciated.  JVP not visible.  No LE edema.  Radial and DP pulses 1+ and symmetric. Respiratory:  Normal respiratory rate and rhythm.  CTAB without rales or wheezes. Abdomen: BS present.  No TTP or rebound all quadrants.  No masses or organomegaly.   Neuro: Oriented to person only.  Cranial nerves roughly 3-12 intact.  3/5 strength in arms and 2/5 in legs bilaterally.  Possibly decreased strength in left.  Speech is slurred due to dry mouth.  Memory is impaired.  Muscle bulk diminished moderately.             Labs on Admission:  The metabolic panel is notable for normal sodium, potassium, bicarbonate, and renal function. TSH normal. Troponin normal. Normocytic anemia, without leukocytosis or thrombocytopenia.     Radiological Exams on Admission: Personally reviewed: Dg Chest 2 View 07/19/2015   No acute disease.    Dg Abd 1 View 07/19/2015   IMPRESSION: Questionable nonobstructing RIGHT renal calculi versus superimposed artifacts from costal cartilage.   Ct Head Wo Contrast 07/19/2015    IMPRESSION:  1. No acute abnormality.  2. Moderate diffuse cerebral and cerebellar atrophy with progression.  3. Moderate chronic small vessel white matter ischemic changes in both cerebral hemispheres with  progression.      EKG: Independently reviewed. Paced.  Assessment/Plan Present on Admission:  . Dehydration . UTI (lower urinary tract infection) . Essential hypertension . PACEMAKER . SICK SINUS/ TACHY-BRADY SYNDROME   1. Dehydration and failure to thrive:  The patient is unable to take anything by mouth per report is clearly dehydrated on exam. Likely this is a large contributor to her weakness and decline over the last few weeks. -Bolus given in the ED -Normal saline at 125 mL per hour overnight   2. Inability to swallow: This is likely the driving factor behind #1 above. This is a global decrease in intake rather than dysphagia or pain with swallowing. It is likely multifactorial given the patient's age, frailty, and dementia -Speech consult tomorrow   3. Possible UTI The patient is unable to describe symptoms but her urinalysis is highly suspicious for repeat UTI. Her last isolate was pan susceptible -ceftriaxone 1 g daily  3. Atrial fibrillation with pacemaker: Continue metoprolol, and aspirin  DVT PPx: Lovenox  Diet: Regular diet  Consultants: Speech  Code Status: DO NOT RESUSCITATE  Family Communication: The patient's case was discussed with her daughter at the bedside, her code site was confirmed, and all questions were answered.   Disposition Plan:  The appropriate admission status for this patient is INPATIENT. Inpatient status is judged to be reasonable and necessary in order to provide the required intensity of service to ensure the patient's safety. The patient's presenting symptoms, physical exam findings, and initial radiographic and laboratory data in the context of their chronic comorbidities is felt to place them at high risk for further clinical deterioration. Furthermore, it is not anticipated that the patient will be medically stable for discharge from the hospital within 2 midnights of admission. The following factors support the admission status of  inpatient.   A. The patient's presenting symptoms include weakness, inability to take anything by mouth. B. The worrisome physical exam findings include weakness, dry mucous membranes. C. The initial radiographic and laboratory data are worrisome because of urinalysis. D. The chronic co-morbidities include atrial fibrillation and dementia. E. Patient requires inpatient status due to high intensity of service, high risk for further deterioration and high frequency of surveillance required. F. I certify that at the point of admission it is my clinical judgment that the patient will require inpatient hospital care  spanning beyond 2 midnights from the point of admission.     Alberteen Sam [facility] Pager [phone]

## 2015-07-19 NOTE — ED Notes (Signed)
Patients daughter states that the patient has been weak with very poor intake for about a month. Patient has a broken right arm from December of 2015. Patient does not respond well verbally. She is unable to answer any questions. She moans and groans. Patient is a DNR according to her daughter. She lives at home with her daughter.

## 2015-07-19 NOTE — ED Notes (Signed)
Johna Roles, RN and myself attempted to do the in and out cath on pt. With 2 attempts there was no luck of obtaining urine.

## 2015-07-19 NOTE — ED Notes (Signed)
Pt is out of room for CT

## 2015-07-19 NOTE — ED Notes (Signed)
Attempted in and out cath to obtain urinalysis, no urine obtained.

## 2015-07-19 NOTE — ED Notes (Signed)
Patient comes from home where she lives with her daughter.  According to patient's daughter at bedside, the patient has not been eating or drinking x2 weeks.  Patient is also unable to swallow her pills because they are too large.  Daughter took patient to her PCP for evaluation several weeks ago and the PCP discontinued several medications because patient is unable to swallow them.  Patient is alert, but uncertain as to where she is.  Patient occasionally verbalizes by groaning, but denies being in pain.  Patient states patient has been doing that at home as well.

## 2015-07-19 NOTE — ED Provider Notes (Signed)
CSN: [id number]     Arrival date & time 07/19/15  1412 History   First MD Initiated Contact with Patient 07/19/15 1459     Chief Complaint  Patient presents with  . Weakness     (Consider location/radiation/quality/duration/timing/severity/associated sxs/prior Treatment) Patient is a 81 y.o. female presenting with general illness. The history is provided by the patient.  Illness Severity:  Severe Onset quality:  Gradual Duration:  6 months Timing:  Constant Progression:  Worsening Chronicity:  Chronic  Level V caveat dementia.   81 year old female with poor intake for at least a month. Has had failure to thrive for at least 6 or 7 months now. Family here for worsening of her baseline status. Past Medical History  Diagnosis Date  . Hypertension   . Hypertensive cardiovascular disease   . Peripheral edema   . Chronic cough   . GERD (gastroesophageal reflux disease)   . Arthritis   . Atrial fibrillation   . Complete heart block     Has old Thera pacemaker on right side that is turned off and new MDT Versa unit on left  . Stroke   . Urinary tract infection    Past Surgical History  Procedure Laterality Date  . Partial hysterectomy    . Appendectomy    . Tonsillectomy and adenoidectomy    . Insert / replace / remove pacemaker  7/10  . Insert / replace / remove pacemaker  5/79  . Insert / replace / remove pacemaker  7/95  . Shoulder surgery    . Tendon repair  07/20/2012    Procedure: TENDON REPAIR;  Surgeon: Nicki Reaper, MD;  Location: [facility];  Service: Orthopedics;  Laterality: Left;  Reconstruction Extensor hood Left middle finger and left ring finger  . Repair extensor tendon Right 12/17/2012    Procedure: REPAIR EXTENSOR TENDON;  Surgeon: Nicki Reaper, MD;  [facility];  Service: Orthopedics;  Laterality: Right;  CENTRALIZATION EXTENSOR TENDON RIGHT RING FINGER/RIGHT SMALL FINGER ANESTHESIA  . Cystoscopy with stent placement  Left 03/15/2015    Procedure: CYSTOSCOPY WITH STENT PLACEMENT left ureter TTEMPTED STONE EXTRACTION with basket;  Surgeon: Marcine Matar, MD;  Location: [facility];  Service: Urology;  Laterality: Left;   Family History  Problem Relation Age of Onset  . Stroke Father   . Heart failure Mother   . Leukemia Sister    Social History  Substance Use Topics  . Smoking status: Never Smoker   . Smokeless tobacco: None  . Alcohol Use: No   OB History    No data available     Review of Systems    Allergies  Depakote and Benicar  Home Medications   Prior to Admission medications   Medication Sig Start Date End Date Taking? Authorizing Jamille Fisher  aspirin EC 81 MG tablet Take 81 mg by mouth daily with breakfast.    Yes Historical Deionna Marcantonio, MD  metoprolol succinate (TOPROL-XL) 25 MG 24 hr tablet TAKE ONE-HALF (1/2) TABLET DAILY 07/18/14  Yes Hillis Range, MD  pantoprazole (PROTONIX) 40 MG tablet Take 40 mg by mouth daily with breakfast.    Yes Historical Jennamarie Goings, MD  Polyethyl Glycol-Propyl Glycol (SYSTANE OP) Place 1 drop into both eyes 2 (two) times daily.    Yes Historical Karlo Goeden, MD  polyethylene glycol (MIRALAX / GLYCOLAX) packet Take 17 g by mouth daily. Patient taking differently: Take 17 g by mouth daily as needed for moderate constipation.  03/25/15  Yes Scheryl Marten  Rhona Leavens, MD  QUEtiapine (SEROQUEL) 25 MG tablet Take 50 mg by mouth at bedtime. 06/22/15  Yes Historical Idriss Quackenbush, MD  sertraline (ZOLOFT) 100 MG tablet Take 100 mg by mouth daily.    Yes Historical Yazir Koerber, MD  traMADol (ULTRAM) 50 MG tablet Take 50 mg by mouth every 6 (six) hours as needed for moderate pain. for pain 06/28/15  Yes Historical Breana Litts, MD   BP 157/70 mmHg  Pulse 135  Temp(Src) 98.1 F (36.7 C) (Oral)  Resp 17  SpO2 100% Physical Exam  Constitutional: She appears cachectic. No distress.  HENT:  Head: Normocephalic and atraumatic.  Eyes: EOM are normal. Pupils are equal, round, and reactive to light.   Neck: Normal range of motion. Neck supple.  Cardiovascular: Normal rate and regular rhythm.  Exam reveals no gallop and no friction rub.   No murmur heard. Pulmonary/Chest: Effort normal. She has no wheezes. She has no rales.  Abdominal: Soft. She exhibits no distension. There is no tenderness. There is no rebound and no guarding.  Musculoskeletal: She exhibits no edema or tenderness.  Neurological: She is alert. She is disoriented.  Skin: Skin is warm and dry. She is not diaphoretic.  Psychiatric: She has a normal mood and affect. Her behavior is normal.  Nursing note and vitals reviewed.   ED Course  Procedures (including critical care time) Labs Review Labs Reviewed  CBC WITH DIFFERENTIAL/PLATELET - Abnormal; Notable for the following:    RBC 3.63 (*)    Hemoglobin 10.8 (*)    HCT 31.7 (*)    Neutro Abs 8.1 (*)    All other components within normal limits  COMPREHENSIVE METABOLIC PANEL - Abnormal; Notable for the following:    Glucose, Bld 150 (*)    Total Protein 6.4 (*)    Albumin 3.3 (*)    ALT 11 (*)    All other components within normal limits  URINALYSIS, ROUTINE W REFLEX MICROSCOPIC (NOT AT [facility]) - Abnormal; Notable for the following:    Color, Urine ORANGE (*)    APPearance TURBID (*)    Hgb urine dipstick MODERATE (*)    Bilirubin Urine SMALL (*)    Protein, ur 30 (*)    Leukocytes, UA LARGE (*)    All other components within normal limits  URINE MICROSCOPIC-ADD ON - Abnormal; Notable for the following:    Bacteria, UA MANY (*)    All other components within normal limits  URINE CULTURE  TSH  I-STAT TROPOININ, ED    Imaging Review Dg Chest 2 View  07/19/2015   CLINICAL DATA:  One month history of weakness and anorexia.  EXAM: CHEST  2 VIEW  COMPARISON:  03/22/2015  FINDINGS: The heart is mildly enlarged but stable. There is tortuosity and mild ectasia of the thoracic aorta. Chronic bronchitic type lung changes along with low lung volumes and vascular  crowding. No infiltrates or effusions. No pneumothorax. The bony thorax is grossly intact. Remote compression fractures are noted.  IMPRESSION: Mild cardiac enlargement and chronic lung changes but no acute pulmonary findings.   Electronically Signed   By: Rudie Meyer M.D.   On: 07/19/2015 17:26   Dg Abd 1 View  07/19/2015   CLINICAL DATA:  Nephrolithiasis  EXAM: ABDOMEN - 1 VIEW  COMPARISON:  06/14/2015  FINDINGS: Numerous pelvic phleboliths.  Calcifications project over the RIGHT kidney but uncertain whether these represent small kidney stones or related to overlying costal cartilage.  Largest of these measures 6 mm diameter.  Slightly  increased stool in rectum.  Bowel gas pattern otherwise normal.  Scattered atherosclerotic calcifications.  Severe osseous demineralization with pronounced levoconvex thoracolumbar scoliosis.  Pacemaker leads project over RIGHT atrium and RIGHT ventricle.  IMPRESSION: Questionable nonobstructing RIGHT renal calculi versus superimposed artifacts from costal cartilage.   Electronically Signed   By: Ulyses Southward M.D.   On: 07/19/2015 23:30   Ct Head Wo Contrast  07/19/2015   CLINICAL DATA:  Right eye drooping since yesterday.  Not eating.  EXAM: CT HEAD WITHOUT CONTRAST  TECHNIQUE: Contiguous axial images were obtained from the base of the skull through the vertex without intravenous contrast.  COMPARISON:  02/14/2004.  FINDINGS: Diffusely enlarged ventricles and subarachnoid spaces. Patchy white matter low density in both cerebral hemispheres. No intracranial hemorrhage, mass lesion or CT evidence of acute infarction. Unremarkable bones and included paranasal sinuses.  IMPRESSION: 1. No acute abnormality. 2. Moderate diffuse cerebral and cerebellar atrophy with progression. 3. Moderate chronic small vessel white matter ischemic changes in both cerebral hemispheres with progression.   Electronically Signed   By: Beckie Salts M.D.   On: 07/19/2015 15:39   I have personally  reviewed and evaluated these images and lab results as part of my medical decision-making.   EKG Interpretation   Date/Time:  Thursday July 19 2015 14:52:47 EDT Ventricular Rate:  76 PR Interval:  122 QRS Duration: 140 QT Interval:  431 QTC Calculation: 485 R Axis:   -42 Text Interpretation:  A-V dual-paced rhythm with some inhibition No  further analysis attempted due to paced  No significant change since last  tracing Confirmed by FLOYD MD, Reuel Boom ([id number]) on 07/19/2015 11:57:10 PM      MDM   Final diagnoses:  Dehydration  UTI (lower urinary tract infection)    81 yo F with failure to thrive-like symptoms. Will obtain a TSH CBC CMP EKG troponin CT head UA chest x-ray.  Patient found to have a possible urinary tract infection. Will admit.  The patients results and plan were reviewed and discussed.   Any x-rays performed were independently reviewed by myself.   Differential diagnosis were considered with the presenting HPI.  Medications  0.45 % sodium chloride infusion (not administered)  sodium chloride 0.9 % bolus 500 mL (0 mLs Intravenous Stopped 07/19/15 1852)  sodium chloride 0.9 % bolus 1,000 mL (1,000 mLs Intravenous New Bag/Given 07/19/15 2102)  cefTRIAXone (ROCEPHIN) 1 g in dextrose 5 % 50 mL IVPB (1 g Intravenous New Bag/Given 07/19/15 2320)    Filed Vitals:   07/19/15 1822 07/19/15 2015 07/19/15 2200 07/19/15 2245  BP: 147/69 127/64 136/62 157/70  Pulse: 70 70 70 135  Temp:      TempSrc:      Resp: SpO2: 100% 100% 100% 100%    Final diagnoses:  Dehydration  UTI (lower urinary tract infection)    Admission/ observation were discussed with the admitting physician, patient and/or family and they are comfortable with the plan.    Melene Plan, DO 07/19/15 2358

## 2015-07-20 DIAGNOSIS — N390 Urinary tract infection, site not specified: Secondary | ICD-10-CM | POA: Diagnosis present

## 2015-07-20 DIAGNOSIS — E860 Dehydration: Secondary | ICD-10-CM

## 2015-07-20 LAB — BASIC METABOLIC PANEL
ANION GAP: 10 (ref 5–15)
BUN: 16 mg/dL (ref 6–20)
CHLORIDE: 108 mmol/L (ref 101–111)
CO2: 23 mmol/L (ref 22–32)
Calcium: 9.4 mg/dL (ref 8.9–10.3)
Creatinine, Ser: 0.5 mg/dL (ref 0.44–1.00)
GFR calc non Af Amer: >60 mL/min (ref >60)
Glucose, Bld: 93 mg/dL (ref 65–99)
POTASSIUM: 3.3 mmol/L — AB (ref 3.5–5.1)
SODIUM: 141 mmol/L (ref 135–145)

## 2015-07-20 LAB — CBC
HEMATOCRIT: 28.9 % — AB (ref 36.0–46.0)
HEMOGLOBIN: 10 g/dL — AB (ref 12.0–15.0)
MCH: 30.7 pg (ref 26.0–34.0)
MCHC: 34.6 g/dL (ref 30.0–36.0)
MCV: 88.7 fL (ref 78.0–100.0)
PLATELETS: 200 10*3/uL (ref 150–400)
RBC: 3.26 MIL/uL — AB (ref 3.87–5.11)
RDW: 14.5 % (ref 11.5–15.5)
WBC: 6.8 10*3/uL (ref 4.0–10.5)

## 2015-07-20 MED ORDER — DEXTROSE 5 % IV SOLN
1.0000 g | INTRAVENOUS | Status: DC
  Administered 2015-07-20 – 2015-07-21 (×2): 1 g via INTRAVENOUS
  Filled 2015-07-20 (×3): qty 10

## 2015-07-20 MED ORDER — ASPIRIN EC 81 MG PO TBEC
81.0000 mg | DELAYED_RELEASE_TABLET | Freq: Every day | ORAL | Status: DC
  Administered 2015-07-20 – 2015-07-24 (×4): 81 mg via ORAL
  Filled 2015-07-20 (×6): qty 1

## 2015-07-20 MED ORDER — PANTOPRAZOLE SODIUM 40 MG PO TBEC
40.0000 mg | DELAYED_RELEASE_TABLET | Freq: Every day | ORAL | Status: DC
Start: 2015-07-20 — End: 2015-07-24
  Administered 2015-07-20 – 2015-07-24 (×5): 40 mg via ORAL
  Filled 2015-07-20 (×7): qty 1

## 2015-07-20 MED ORDER — METOPROLOL SUCCINATE 12.5 MG HALF TABLET
12.5000 mg | ORAL_TABLET | Freq: Every day | ORAL | Status: DC
Start: 2015-07-20 — End: 2015-07-24
  Administered 2015-07-20 – 2015-07-24 (×5): 12.5 mg via ORAL
  Filled 2015-07-20 (×5): qty 1

## 2015-07-20 MED ORDER — ENSURE ENLIVE PO LIQD
237.0000 mL | Freq: Two times a day (BID) | ORAL | Status: DC
  Administered 2015-07-20 – 2015-07-26 (×11): 237 mL via ORAL

## 2015-07-20 MED ORDER — ENOXAPARIN SODIUM 40 MG/0.4ML ~~LOC~~ SOLN
40.0000 mg | SUBCUTANEOUS | Status: DC
  Administered 2015-07-20 – 2015-07-24 (×5): 40 mg via SUBCUTANEOUS
  Filled 2015-07-20 (×5): qty 0.4

## 2015-07-20 MED ORDER — SERTRALINE HCL 100 MG PO TABS
100.0000 mg | ORAL_TABLET | Freq: Every day | ORAL | Status: DC
Start: 2015-07-20 — End: 2015-07-24
  Administered 2015-07-20 – 2015-07-24 (×5): 100 mg via ORAL
  Filled 2015-07-20 (×5): qty 1

## 2015-07-20 MED ORDER — POTASSIUM CHLORIDE IN NACL 20-0.9 MEQ/L-% IV SOLN
INTRAVENOUS | Status: DC
  Administered 2015-07-20 – 2015-07-21 (×3): via INTRAVENOUS
  Filled 2015-07-20 (×5): qty 1000

## 2015-07-20 NOTE — Progress Notes (Signed)
[name] PROGRESS NOTE  Brenda Cook:[id number] DOB: 05-May-1921 DOA: 07/19/2015 PCP: [name], MD  Assessment/Plan: Principal Problem:   Dehydration Active Problems:   Essential hypertension   SICK SINUS/ TACHY-BRADY SYNDROME   PACEMAKER   UTI (lower urinary tract infection)   1. Dehydration and failure to thrive: Likely secondary to urinary tract infection decline over the last few weeks. Order PT/OT consult Continue hydration with normal saline, hold diuretics   2. Inability to swallow: Cleared by speech therapy today  Started on a regular diet with thin liquids   3. Urinary tract infection,-continue with Rocephin   4. Atrial fibrillation with pacemaker: Rhythm is paced on EKG, Continue metoprolol, and aspirin  5. HTN: continue metoprolol  6. GERD (gastroesophageal reflux disease)  7.CVA - aspirin,  DVT prophylaxsis Lovenox  Code Status:      Code Status Orders        Start     Ordered   07/20/15 0022  Do not attempt resuscitation (DNR)   Continuous    Question Answer Comment  In the event of cardiac or respiratory ARREST Do not call a "code blue"   In the event of cardiac or respiratory ARREST Do not perform Intubation, CPR, defibrillation or ACLS   In the event of cardiac or respiratory ARREST Use medication by any route, position, wound care, and other measures to relive pain and suffering. May use oxygen, suction and manual treatment of airway obstruction as needed for comfort.      07/20/15 0021    Advance Directive Documentation        Most Recent Value   Type of Advance Directive  Living will, Healthcare Power of Attorney   Pre-existing out of facility DNR order (yellow form or pink MOST form)     "MOST" Form in Place?       Family Communication: family updated about patient's clinical progress Disposition Plan:  As above    Brief narrative: Brenda Cook is a 81 y.o. female with a past medical history  significant for atrial fibrillation, not on anticoagulation, chronic diastolic heart failure, tachybradycardia syndrome, with pacer, and recent severe sepsis from pyelonephritis in May 2016 who presents with subacute weakness and inability to take anything by mouth.  All history is collected from the patient's daughter who is a reliable historian and with whom she lives. At baseline the patient has not been able to walk since last December. She had a fall around that time, broke her right humerus and was put in rehabilitation. She had an episode of severe sepsis in May of this year when she was found to have a left obstructing stone, a Proteus UTI, and severe sepsis. She subsequently had a stent placed and had an uneventful recovery until the last few weeks.  Now, the daughter describes how the patient returned home from a SNF around July, but over the last 2-3 weeks she has noticed that her mother refuses to eat anything more than a small bite, refuses to take any of her medicines, has been less communicative even than before, and is more weak than usual. The daughter has tried many different consistencies of food but none of them successful. This week she began to suspect that her mother had a UTI, so she brought her to Dr. [name] office, where the urologist performed a urine cath suggested they go to the ER if her mother became sicker. Subsequently her mother started vomiting and so she  presented to the ER tonight.  In the ED, the patient was afebrile, and hemodynamically stable. She had no leukocytosis, but a cath urine specimen had copious white blood cells. She was given IV fluids, started on ceftriaxone and prepared for admission to [facility].  Consultants:  None  Procedures:  None  Antibiotics: Anti-infectives    Start     Dose/Rate Route Frequency Ordered Stop   07/20/15 2200  cefTRIAXone (ROCEPHIN) 1 g in dextrose 5 % 50 mL IVPB     1 g 100 mL/hr over 30 Minutes Intravenous  Every 24 hours 07/20/15 0323     07/19/15 2245  cefTRIAXone (ROCEPHIN) 1 g in dextrose 5 % 50 mL IVPB     1 g 100 mL/hr over 30 Minutes Intravenous  Once 07/19/15 2241 07/19/15 2350         HPI/Subjective: Anxious and confused, hungry and would like to eat  Objective: Filed Vitals:   07/19/15 2200 07/19/15 2245 07/20/15 0016 07/20/15 0552  BP: 136/62 157/70 144/66 178/67  Pulse: 70 135 69 69  Temp:   97.9 F (36.6 C) 97.4 F (36.3 C)  TempSrc:   Oral Oral  Resp: 16 17  16   Weight:   59 kg (130 lb 1.1 oz)   SpO2: 100% 100% 98% 98%    Intake/Output Summary (Last 24 hours) at 07/20/15 1245 Last data filed at 07/20/15 6962  Gross per 24 hour  Intake    560 ml  Output      0 ml  Net    560 ml    Exam: General: Elderly adult female, sluggish but in no obvious distress. Responds slowly to questions. Hard of hearing.  HEENT: Head normal. Eyes normal. PERRL and EOMI. Nose normal. OP very dry, very dry oral mucosa, without ulcers or redness. No airway deformities. No thyromegaly.  Lymph: No cervical or axillary lymphadenopathy. Skin: Warm and dry. Decreased turgor. No jaundice.  Cardiac: RRR, nl S1-S2, no murmurs appreciated. JVP not visible. No LE edema. Radial and DP pulses 1+ and symmetric. Respiratory: Normal respiratory rate and rhythm. CTAB without rales or wheezes. Abdomen: BS present. No TTP or rebound all quadrants. No masses or organomegaly.  Neuro: Oriented to person only. Cranial nerves roughly 3-12 intact. 3/5 strength in arms and 2/5 in legs bilaterally. Possibly decreased strength in left. Speech is slurred due to dry mouth. Memory is impaired. Muscle bulk diminished moderately.  Data Review   Micro Results No results found for this or any previous visit (from the past 240 hour(s)).  Radiology Reports Dg Chest 2 View  07/19/2015   CLINICAL DATA:  One month history of weakness and anorexia.  EXAM: CHEST  2 VIEW  COMPARISON:  03/22/2015   FINDINGS: The heart is mildly enlarged but stable. There is tortuosity and mild ectasia of the thoracic aorta. Chronic bronchitic type lung changes along with low lung volumes and vascular crowding. No infiltrates or effusions. No pneumothorax. The bony thorax is grossly intact. Remote compression fractures are noted.  IMPRESSION: Mild cardiac enlargement and chronic lung changes but no acute pulmonary findings.   Electronically Signed   By: Rudie Meyer M.D.   On: 07/19/2015 17:26   Dg Abd 1 View  07/19/2015   CLINICAL DATA:  Nephrolithiasis  EXAM: ABDOMEN - 1 VIEW  COMPARISON:  06/14/2015  FINDINGS: Numerous pelvic phleboliths.  Calcifications project over the RIGHT kidney but uncertain whether these represent small kidney stones or related to overlying costal cartilage.  Largest  of these measures 6 mm diameter.  Slightly increased stool in rectum.  Bowel gas pattern otherwise normal.  Scattered atherosclerotic calcifications.  Severe osseous demineralization with pronounced levoconvex thoracolumbar scoliosis.  Pacemaker leads project over RIGHT atrium and RIGHT ventricle.  IMPRESSION: Questionable nonobstructing RIGHT renal calculi versus superimposed artifacts from costal cartilage.   Electronically Signed   By: Ulyses Southward M.D.   On: 07/19/2015 23:30   Ct Head Wo Contrast  07/19/2015   CLINICAL DATA:  Right eye drooping since yesterday.  Not eating.  EXAM: CT HEAD WITHOUT CONTRAST  TECHNIQUE: Contiguous axial images were obtained from the base of the skull through the vertex without intravenous contrast.  COMPARISON:  02/14/2004.  FINDINGS: Diffusely enlarged ventricles and subarachnoid spaces. Patchy white matter low density in both cerebral hemispheres. No intracranial hemorrhage, mass lesion or CT evidence of acute infarction. Unremarkable bones and included paranasal sinuses.  IMPRESSION: 1. No acute abnormality. 2. Moderate diffuse cerebral and cerebellar atrophy with progression. 3. Moderate  chronic small vessel white matter ischemic changes in both cerebral hemispheres with progression.   Electronically Signed   By: Beckie Salts M.D.   On: 07/19/2015 15:39     CBC  Recent Labs Lab 07/19/15 1543 07/20/15 0500  WBC 9.3 6.8  HGB 10.8* 10.0*  HCT 31.7* 28.9*  PLT 179 200  MCV 87.3 88.7  MCH 29.8 30.7  MCHC 34.1 34.6  RDW 14.5 14.5  LYMPHSABS 0.7  --   MONOABS 0.4  --   EOSABS 0.0  --   BASOSABS 0.0  --     Chemistries   Recent Labs Lab 07/19/15 1543 07/20/15 0500  NA 138 141  K 3.8 3.3*  CL 103 108  CO2 25 23  GLUCOSE 150* 93  BUN 18 16  CREATININE 0.79 0.50  CALCIUM 9.8 9.4  AST 21  --   ALT 11*  --   ALKPHOS 72  --   BILITOT 0.9  --    ------------------------------------------------------------------------------------------------------------------ estimated creatinine clearance is 37.9 mL/min (by C-G formula based on Cr of 0.5). ------------------------------------------------------------------------------------------------------------------ No results for input(s): HGBA1C in the last 72 hours. ------------------------------------------------------------------------------------------------------------------ No results for input(s): CHOL, HDL, LDLCALC, TRIG, CHOLHDL, LDLDIRECT in the last 72 hours. ------------------------------------------------------------------------------------------------------------------  Recent Labs  07/19/15 1543  TSH 1.107   ------------------------------------------------------------------------------------------------------------------ No results for input(s): VITAMINB12, FOLATE, FERRITIN, TIBC, IRON, RETICCTPCT in the last 72 hours.  Coagulation profile No results for input(s): INR, PROTIME in the last 168 hours.  No results for input(s): DDIMER in the last 72 hours.  Cardiac Enzymes No results for input(s): CKMB, TROPONINI, MYOGLOBIN in the last 168 hours.  Invalid input(s):  CK ------------------------------------------------------------------------------------------------------------------ Invalid input(s): POCBNP   CBG: No results for input(s): GLUCAP in the last 168 hours.     Studies: Dg Chest 2 View  07/19/2015   CLINICAL DATA:  One month history of weakness and anorexia.  EXAM: CHEST  2 VIEW  COMPARISON:  03/22/2015  FINDINGS: The heart is mildly enlarged but stable. There is tortuosity and mild ectasia of the thoracic aorta. Chronic bronchitic type lung changes along with low lung volumes and vascular crowding. No infiltrates or effusions. No pneumothorax. The bony thorax is grossly intact. Remote compression fractures are noted.  IMPRESSION: Mild cardiac enlargement and chronic lung changes but no acute pulmonary findings.   Electronically Signed   By: Rudie Meyer M.D.   On: 07/19/2015 17:26   Dg Abd 1 View  07/19/2015   CLINICAL DATA:  Nephrolithiasis  EXAM: ABDOMEN - 1 VIEW  COMPARISON:  06/14/2015  FINDINGS: Numerous pelvic phleboliths.  Calcifications project over the RIGHT kidney but uncertain whether these represent small kidney stones or related to overlying costal cartilage.  Largest of these measures 6 mm diameter.  Slightly increased stool in rectum.  Bowel gas pattern otherwise normal.  Scattered atherosclerotic calcifications.  Severe osseous demineralization with pronounced levoconvex thoracolumbar scoliosis.  Pacemaker leads project over RIGHT atrium and RIGHT ventricle.  IMPRESSION: Questionable nonobstructing RIGHT renal calculi versus superimposed artifacts from costal cartilage.   Electronically Signed   By: Ulyses Southward M.D.   On: 07/19/2015 23:30   Ct Head Wo Contrast  07/19/2015   CLINICAL DATA:  Right eye drooping since yesterday.  Not eating.  EXAM: CT HEAD WITHOUT CONTRAST  TECHNIQUE: Contiguous axial images were obtained from the base of the skull through the vertex without intravenous contrast.  COMPARISON:  02/14/2004.  FINDINGS:  Diffusely enlarged ventricles and subarachnoid spaces. Patchy white matter low density in both cerebral hemispheres. No intracranial hemorrhage, mass lesion or CT evidence of acute infarction. Unremarkable bones and included paranasal sinuses.  IMPRESSION: 1. No acute abnormality. 2. Moderate diffuse cerebral and cerebellar atrophy with progression. 3. Moderate chronic small vessel white matter ischemic changes in both cerebral hemispheres with progression.   Electronically Signed   By: Beckie Salts M.D.   On: 07/19/2015 15:39      No results found for: HGBA1C Lab Results  Component Value Date   CREATININE 0.50 07/20/2015       Scheduled Meds: . aspirin EC  81 mg Oral Q breakfast  . cefTRIAXone (ROCEPHIN)  IV  1 g Intravenous Q24H  . enoxaparin (LOVENOX) injection  40 mg Subcutaneous Q24H  . feeding supplement (ENSURE ENLIVE)  237 mL Oral BID BM  . metoprolol succinate  12.5 mg Oral Daily  . pantoprazole  40 mg Oral Q breakfast  . sertraline  100 mg Oral Daily   Continuous Infusions: . 0.9 % NaCl with KCl 20 mEq / L 125 mL/hr at 07/20/15 1610    Principal Problem:   Dehydration Active Problems:   Essential hypertension   SICK SINUS/ TACHY-BRADY SYNDROME   PACEMAKER   UTI (lower urinary tract infection)    Time spent: 45 minutes   [facility]  [facility] Pager [phone]. If 7PM-7AM, please contact night-coverage at [email or web address], password [name] 07/20/2015, 12:45 PM  LOS: 1 day

## 2015-07-20 NOTE — Evaluation (Addendum)
Clinical/Bedside Swallow Evaluation Patient Details  Name: Brenda Cook MRN: [id number] Date of Birth: 1921/02/03  Today's Date: 07/20/2015 Time: SLP Start Time (ACUTE ONLY): 0930 SLP Stop Time (ACUTE ONLY): 0955 SLP Time Calculation (min) (ACUTE ONLY): 25 min  Past Medical History:  Past Medical History  Diagnosis Date  . Hypertension   . Hypertensive cardiovascular disease   . Peripheral edema   . Chronic cough   . GERD (gastroesophageal reflux disease)   . Arthritis   . Atrial fibrillation   . Complete heart block     Has old Thera pacemaker on right side that is turned off and new MDT Versa unit on left  . Stroke   . Urinary tract infection    Past Surgical History:  Past Surgical History  Procedure Laterality Date  . Partial hysterectomy    . Appendectomy    . Tonsillectomy and adenoidectomy    . Insert / replace / remove pacemaker  7/10  . Insert / replace / remove pacemaker  5/79  . Insert / replace / remove pacemaker  7/95  . Shoulder surgery    . Tendon repair  07/20/2012    Procedure: TENDON REPAIR;  Surgeon: Nicki Reaper, MD;  Location: [facility];  Service: Orthopedics;  Laterality: Left;  Reconstruction Extensor hood Left middle finger and left ring finger  . Repair extensor tendon Right 12/17/2012    Procedure: REPAIR EXTENSOR TENDON;  Surgeon: Nicki Reaper, MD;  Location: [facility];  Service: Orthopedics;  Laterality: Right;  CENTRALIZATION EXTENSOR TENDON RIGHT RING FINGER/RIGHT SMALL FINGER ANESTHESIA  . Cystoscopy with stent placement Left 03/15/2015    Procedure: CYSTOSCOPY WITH STENT PLACEMENT left ureter TTEMPTED STONE EXTRACTION with basket;  Surgeon: Marcine Matar, MD;  Location: [facility];  Service: Urology;  Laterality: Left;   HPI:  81 y.o. female with a past medical history significant for atrial fibrillation, not on anticoagulation, chronic diastolic heart failure, tachybradycardia syndrome, with pacer, and recent  severe sepsis from pyelonephritis in May 2016 who presents with subacute weakness and inability to take anything by mouth.   Assessment / Plan / Recommendation Clinical Impression   Pt without overt s/s of aspiration overall; despite one cough initially with successive swallows of thin via straw with ice; subsequent swallows within functional limits and other consistencies assessed within functional limits as well as oropharyngeal swallow appeared WNL; pt was speaking during trials which may have impacted d/t confusion; pt exhibited some awareness as she stated, "I can't think straight, I have to get this confusion under control." CXR on 07/19/15 noted chronic lung changes, but no acute pulmonary changes. Daughter stated when pt was admitted on 07/19/15 that she has had limited po intake over last 3 weeks; recommendation for regular/thin diet with intermittent staff assistance with self-feeding; no f/u recommended from ST at this time.   Aspiration Risk  Mild d/t cognition   Diet Recommendation Thin;Other (Comment) (Regular)   Medication Administration: Whole meds with liquid Compensations: Slow rate;Small sips/bites    Other  Recommendations Oral Care Recommendations: Oral care BID   Follow Up Recommendations    none   Frequency and Duration   n/a     Pertinent Vitals/Pain WDL    SLP Swallow Goals  N/A   Swallow Study Prior Functional Status   Lives with daughter; min-moderate assistance with ADL's    General Date of Onset: 07/19/15 Other Pertinent Information: 81 y.o. female with a past medical history significant for atrial  fibrillation, not on anticoagulation, chronic diastolic heart failure, tachybradycardia syndrome, with pacer, and recent severe sepsis from pyelonephritis in May 2016 who presents with subacute weakness and inability to take anything by mouth. Type of Study: Bedside swallow evaluation Diet Prior to this Study: Regular;Thin liquids Temperature Spikes Noted:  No Respiratory Status: Room air History of Recent Intubation: No Behavior/Cognition: Alert;Cooperative;Confused Oral Cavity - Dentition: Adequate natural dentition/normal for age Self-Feeding Abilities: Needs assist Patient Positioning: Upright in bed Baseline Vocal Quality: Normal Volitional Cough: Strong Volitional Swallow: Able to elicit    Oral/Motor/Sensory Function Overall Oral Motor/Sensory Function: Appears within functional limits for tasks assessed   Ice Chips Ice chips: Not tested   Thin Liquid Thin Liquid: Within functional limits Presentation: Cup;Straw    Nectar Thick Nectar Thick Liquid: Not tested   Honey Thick Honey Thick Liquid: Not tested   Puree Puree: Within functional limits Presentation: Spoon   Solid       Solid: Within functional limits       ADAMS,PAT, M.S., CCC-SLP 07/20/2015,11:05 AM

## 2015-07-20 NOTE — Progress Notes (Signed)
Initial Nutrition Assessment  DOCUMENTATION CODES:   Not applicable  INTERVENTION:  - Will order Ensure Enlive BID, each supplement provides 350 kcal and 20 grams of protein - Will order Magic Cup BID with meals, each supplement provides 290 kcal and 9 grams of protein - Meal assistance and encouragement needed - RD will continue to monitor for needs  NUTRITION DIAGNOSIS:   Inadequate oral intake related to poor appetite as evidenced by per patient/family report, meal completion < 25%.  GOAL:   Patient will meet greater than or equal to 90% of their needs  MONITOR:   PO intake, Supplement acceptance, Weight trends, Labs, I & O's  REASON FOR ASSESSMENT:   Malnutrition Screening Tool  ASSESSMENT:   81 y.o. female with a past medical history significant for atrial fibrillation, not on anticoagulation, chronic diastolic heart failure, tachybradycardia syndrome, with pacer, and recent severe sepsis from pyelonephritis in May 2016 who presents with subacute weakness and inability to take anything by mouth.  Pt seen for MST. BMI indicates normal weight status. Per chart review, SLP recommended current diet order and pt ate a bowl of applesauce for breakfast this AM. Pt confused at time of visit and no family present; pt unable to provide any information. Per chart review, daughter had reported decreased appetite and intakes x2-4 weeks. Will need to find out further details of this at follow-up  Physical assessment showed mild muscle wasting, no fat wasting, and no edema. Per chart review, pt has gained 2 lbs since May 2016.  Not meeting needs. Likely needs assistance with meals. Will order supplements as outlined above and continue to monitor for needs, plan of care. Medications reviewed. Labs reviewed; K: 3.3 mmol/L.   Diet Order:  Diet regular Room service appropriate?: Yes; Fluid consistency:: Thin  Skin:  Reviewed, no issues  Last BM:  PTA  Height:   Ht Readings from Last  1 Encounters:  03/15/15  (1.6 m)    Weight:   Wt Readings from Last 1 Encounters:  07/20/15 130 lb 1.1 oz (59 kg)    Ideal Body Weight:  52.27 kg (kg)  BMI:  Body mass index is 23.05 kg/(m^2).  Estimated Nutritional Needs:   Kcal:  1180-1380  Protein:  50-60 grams  Fluid:  2 L/day  EDUCATION NEEDS:   No education needs identified at this time     Trenton Gammon, RD, LDN Inpatient Clinical Dietitian Pager # [phone] After hours/weekend pager # [phone]

## 2015-07-21 LAB — COMPREHENSIVE METABOLIC PANEL
ALBUMIN: 2.9 g/dL — AB (ref 3.5–5.0)
ALK PHOS: 63 U/L (ref 38–126)
ALT: 12 U/L — AB (ref 14–54)
AST: 20 U/L (ref 15–41)
Anion gap: 9 (ref 5–15)
BILIRUBIN TOTAL: 0.4 mg/dL (ref 0.3–1.2)
BUN: 12 mg/dL (ref 6–20)
CO2: 21 mmol/L — ABNORMAL LOW (ref 22–32)
CREATININE: 0.53 mg/dL (ref 0.44–1.00)
Calcium: 9.2 mg/dL (ref 8.9–10.3)
Chloride: 109 mmol/L (ref 101–111)
GFR calc Af Amer: >60 mL/min (ref >60)
GFR calc non Af Amer: >60 mL/min (ref >60)
GLUCOSE: 105 mg/dL — AB (ref 65–99)
POTASSIUM: 3.1 mmol/L — AB (ref 3.5–5.1)
Sodium: 139 mmol/L (ref 135–145)
TOTAL PROTEIN: 5.3 g/dL — AB (ref 6.5–8.1)

## 2015-07-21 LAB — CBC
HEMATOCRIT: 26.9 % — AB (ref 36.0–46.0)
Hemoglobin: 9.1 g/dL — ABNORMAL LOW (ref 12.0–15.0)
MCH: 29.7 pg (ref 26.0–34.0)
MCHC: 33.8 g/dL (ref 30.0–36.0)
MCV: 87.9 fL (ref 78.0–100.0)
PLATELETS: 201 10*3/uL (ref 150–400)
RBC: 3.06 MIL/uL — ABNORMAL LOW (ref 3.87–5.11)
RDW: 14.4 % (ref 11.5–15.5)
WBC: 5.7 10*3/uL (ref 4.0–10.5)

## 2015-07-21 LAB — C DIFFICILE QUICK SCREEN W PCR REFLEX
C DIFFICILE (CDIFF) TOXIN: NEGATIVE
C Diff antigen: POSITIVE — AB

## 2015-07-21 MED ORDER — HYDRALAZINE HCL 20 MG/ML IJ SOLN
5.0000 mg | Freq: Four times a day (QID) | INTRAMUSCULAR | Status: DC | PRN
  Administered 2015-07-21 – 2015-07-24 (×2): 5 mg via INTRAVENOUS
  Filled 2015-07-21 (×2): qty 1

## 2015-07-21 MED ORDER — ACETAMINOPHEN 325 MG PO TABS
650.0000 mg | ORAL_TABLET | Freq: Four times a day (QID) | ORAL | Status: DC | PRN
  Administered 2015-07-21 – 2015-07-24 (×4): 650 mg via ORAL
  Filled 2015-07-21 (×4): qty 2

## 2015-07-21 MED ORDER — VANCOMYCIN 50 MG/ML ORAL SOLUTION
125.0000 mg | Freq: Four times a day (QID) | ORAL | Status: DC
  Administered 2015-07-21 – 2015-07-24 (×13): 125 mg via ORAL
  Filled 2015-07-21 (×21): qty 2.5

## 2015-07-21 NOTE — Evaluation (Signed)
Occupational Therapy Evaluation Patient Details Name: Brenda Cook MRN: [id number] DOB: 04/27/1921 Today's Date: 07/21/2015    History of Present Illness 81 y.o. female with a past medical history significant for atrial fibrillation, not on anticoagulation, chronic diastolic heart failure, tachybradycardia syndrome, with pacer, and recent severe sepsis from pyelonephritis in May 2016 who presents with subacute weakness and inability to take anything by mouth.   Clinical Impression   No acute OT needs identified, as pt was total assist PTA and daughter assisting with lift transfers in/out of bed. Please send text page to OT services if any questions, concerns, or with new orders: [phone] OR call office at [phone]. Thank you for the order.    According to chart, pt left [facility] July 2016 and has been taken care of by her daughter who provides 24/7 supervision/assistance and uses a lift for transfers.     Follow Up Recommendations  No OT follow up;Supervision/Assistance - 24 hour    Equipment Recommendations  None recommended by OT    Recommendations for Other Services  None at this time   Precautions / Restrictions Precautions Precautions: Fall Restrictions Weight Bearing Restrictions: No    Mobility Bed Mobility Overal bed mobility: Needs Assistance Bed Mobility: Rolling;Supine to Sit Rolling: Mod assist;+2 for physical assistance;+2 for safety/equipment   Supine to sit: Max assist;+2 for physical assistance;+2 for safety/equipment - from my understanding, pt does not usually sit EOB. Hoyer lift transfers completed by daughter to assist pt in/ou of bed.   Transfers General transfer comment: Did not occur, pt using hoyer lift at home and per daughter has not walked since december 2015    Balance Overall balance assessment: Needs assistance Sitting-balance support: No upper extremity supported;Feet supported Sitting balance-Leahy Scale: Zero     ADL Overall  ADL's : Needs assistance/impaired;At baseline General ADL Comments: Daughter reports that she aids pt with ADLs at bed level and uses hoyer lift for all transfers. Daughter reports that she has been using depends for toileting needs and cleaning up pt in bed.     Pertinent Vitals/Pain Pain Assessment: Faces Faces Pain Scale: Hurts little more Pain Location: buttock when getting cleaned Pain Descriptors / Indicators: Grimacing;Discomfort Pain Intervention(s): Monitored during session     Hand Dominance Right   Extremity/Trunk Assessment Upper Extremity Assessment Upper Extremity Assessment: Generalized weakness   Lower Extremity Assessment Lower Extremity Assessment: Defer to PT evaluation   Cervical / Trunk Assessment Cervical / Trunk Assessment: Kyphotic   Communication Communication Communication: No difficulties   Cognition Arousal/Alertness: Awake/alert Behavior During Therapy: WFL for tasks assessed/performed Overall Cognitive Status: History of cognitive impairments - at baseline              Home Living Family/patient expects to be discharged to:: Private residence Living Arrangements: Children (daughter) Available Help at Discharge: Family;Available 24 hours/day   Home Access: Ramped entrance     Home Layout: One level Home Equipment: Wheelchair - manual;Other (comment) (hoyer lift, adjustable bed)    Prior Functioning/Environment Level of Independence: Needs assistance  Gait / Transfers Assistance Needed: Pt does not stand and has been non ambulatory for some time.  Daughter and son in law use hoyer for transfers  ADL's / Homemaking Assistance Needed: Pt is able to feed herself ~50% of meal once daughter sets her up.  Otherwise she is total A.  Pt utilizes a bedpan for toileting,   Comments: daughter helped pt extensively with ADLs    OT Diagnosis: Generalized weakness  OT Problem List:  n/a, no acute OT needs identified    OT  Treatment/Interventions:   n/a, no acute OT needs identified    OT Goals(Current goals can be found in the care plan section) Acute Rehab OT Goals Patient Stated Goal: none stated OT Goal Formulation: All assessment and education complete, DC therapy  OT Frequency:   n/a, no acute OT needs identified    Barriers to D/C:  None known at this time     End of Session Nurse Communication: Mobility status;Other (comment) (wet bed - to check foley)  Activity Tolerance: Patient limited by fatigue;Patient limited by lethargy Patient left: in bed;with call bell/phone within reach;with bed alarm set   Time: 1411-1434 OT Time Calculation (min): 23 min Charges:  OT General Charges $OT Visit: 1 Procedure   CLAY,PATRICIA , MS, OTR/L, CLT Pager: [phone]   07/21/2015, 2:57 PM

## 2015-07-21 NOTE — Progress Notes (Signed)
MEDICATION RELATED CONSULT NOTE - INITIAL   Pharmacy Consult for  C. Diff management  Allergies  Allergen Reactions  . Depakote [Valproic Acid] Other (See Comments)    disorientation  . Benicar [Olmesartan Medoxomil] Other (See Comments)    Not effective    Patient Measurements: Height:  (162.6 cm) Weight: 130 lb 1.1 oz (59 kg) IBW/kg (Calculated) : 54.7  Vital Signs: Temp: 98.4 F (36.9 C) (09/23 2123) Temp Source: Oral (09/23 2123) BP: 154/73 mmHg (09/23 2123) Pulse Rate: 70 (09/23 2123) Intake/Output from previous day: 09/23 0701 - 09/24 0700 In: 2388.3 [P.O.:315; I.V.:2073.3] Out: 900 [Urine:900] Intake/Output from this shift: Total I/O In: 356.7 [P.O.:60; I.V.:296.7] Out: 500 [Urine:500]  Labs:  Recent Labs  07/19/15 1543 07/20/15 0500  WBC 9.3 6.8  HGB 10.8* 10.0*  HCT 31.7* 28.9*  PLT 179 200  CREATININE 0.79 0.50  ALBUMIN 3.3*  --   PROT 6.4*  --   AST 21  --   ALT 11*  --   ALKPHOS 72  --   BILITOT 0.9  --    Estimated Creatinine Clearance: 39.6 mL/min (by C-G formula based on Cr of 0.5).   Microbiology: Recent Results (from the past 720 hour(s))  C difficile quick scan w PCR reflex     Status: Abnormal   Collection Time: 07/21/15  2:02 AM  Result Value Ref Range Status   C Diff antigen POSITIVE (A) NEGATIVE Final   C Diff toxin NEGATIVE NEGATIVE Final   C Diff interpretation   Final    C. difficile present, but toxin not detected. This indicates colonization. In most cases, this does not require treatment. If patient has signs and symptoms consistent with colitis, consider treatment.    Medical History: Past Medical History  Diagnosis Date  . Hypertension   . Hypertensive cardiovascular disease   . Peripheral edema   . Chronic cough   . GERD (gastroesophageal reflux disease)   . Arthritis   . Atrial fibrillation   . Complete heart block     Has old Thera pacemaker on right side that is turned off and new MDT Versa unit on left   . Stroke   . Urinary tract infection     Medications:  Scheduled:  . aspirin EC  81 mg Oral Q breakfast  . cefTRIAXone (ROCEPHIN)  IV  1 g Intravenous Q24H  . enoxaparin (LOVENOX) injection  40 mg Subcutaneous Q24H  . feeding supplement (ENSURE ENLIVE)  237 mL Oral BID BM  . metoprolol succinate  12.5 mg Oral Daily  . pantoprazole  40 mg Oral Q breakfast  . sertraline  100 mg Oral Daily  . vancomycin  125 mg Oral 4 times per day   Infusions:  . 0.9 % NaCl with KCl 20 mEq / L 50 mL/hr at 07/20/15 2356    Assessment:  81 yr female admitted on 9/22 with dehydration, failure to thrive, UTI  CDiff quick scan 9(/24) - + C Diff antigen (no toxin detected); +Antigen indicates colonization  Pharmacy consulted to initiate treatment for first occurrence of C Diff  Goal of Therapy:  Eradication of infection  Plan:  Vancomycin  PO QID (typical length of therapy = 14 days)  Maryellen Pile, PharmD 07/21/2015,6:09 AM

## 2015-07-21 NOTE — Evaluation (Signed)
Physical Therapy Evaluation Patient Details Name: TENIQUA MARRON MRN: [id number] DOB: 04-Aug-1921 Today's Date: 07/21/2015   History of Present Illness  81 y.o. female with a past medical history significant for atrial fibrillation, not on anticoagulation, chronic diastolic heart failure, tachybradycardia syndrome, with pacer, and recent severe sepsis from pyelonephritis in May 2016 who presents with subacute weakness and inability to take anything by mouth.  Clinical Impression  No acute PT needs identified, as pt was total assist PTA and daughter assisting with lift transfers in/out of bed with mechanical lift.  Pt nonambulatory since Dec 2015.   PT services dc at this time.    Follow Up Recommendations No PT follow up    Equipment Recommendations  None recommended by PT    Recommendations for Other Services       Precautions / Restrictions Precautions Precautions: Fall Restrictions Weight Bearing Restrictions: No      Mobility  Bed Mobility Overal bed mobility: Needs Assistance Bed Mobility: Rolling;Supine to Sit Rolling: Mod assist;+2 for physical assistance;+2 for safety/equipment   Supine to sit: Max assist;+2 for physical assistance;+2 for safety/equipment     General bed mobility comments: step by step cues and assist for each movement.  Transfers                 General transfer comment: Did not occur, pt using hoyer lift at home and per daughter has not walked since december 2015.  Pt sat at EOB x 5 min with mod assist to correct for R and post lean.  Pt fatigues easily  Ambulation/Gait                Stairs            Wheelchair Mobility    Modified Rankin (Stroke Patients Only)       Balance Overall balance assessment: Needs assistance Sitting-balance support: No upper extremity supported Sitting balance-Leahy Scale: Zero                                       Pertinent Vitals/Pain Pain Assessment: Faces Faces  Pain Scale: Hurts little more Pain Location: buttock Pain Descriptors / Indicators: Grimacing Pain Intervention(s): Limited activity within patient's tolerance;Monitored during session    Home Living Family/patient expects to be discharged to:: Private residence Living Arrangements: Children Available Help at Discharge: Family;Available 24 hours/day Type of Home: House Home Access: Ramped entrance     Home Layout: One level Home Equipment: Wheelchair - manual;Other (comment)      Prior Function Level of Independence: Needs assistance   Gait / Transfers Assistance Needed: Pt does not stand and has been non ambulatory for some time.  Daughter and son in law use hoyer for transfers   ADL's / Homemaking Assistance Needed: Pt is able to feed herself ~50% of meal once daughter sets her up.  Otherwise she is total A.  Pt utilizes a bedpan for toileting,  Comments: daughter helped pt extensively with ADLs     Hand Dominance   Dominant Hand: Right    Extremity/Trunk Assessment   Upper Extremity Assessment: Generalized weakness           Lower Extremity Assessment: Generalized weakness      Cervical / Trunk Assessment: Kyphotic  Communication   Communication: No difficulties  Cognition Arousal/Alertness: Awake/alert Behavior During Therapy: WFL for tasks assessed/performed Overall Cognitive Status: History of cognitive impairments - at  baseline                      General Comments      Exercises        Assessment/Plan    PT Assessment Patent does not need any further PT services  PT Diagnosis Difficulty walking   PT Problem List    PT Treatment Interventions     PT Goals (Current goals can be found in the Care Plan section) Acute Rehab PT Goals Patient Stated Goal: none stated    Frequency     Barriers to discharge        Co-evaluation PT/OT/SLP Co-Evaluation/Treatment: Yes Reason for Co-Treatment: For patient/therapist safety PT goals  addressed during session: Mobility/safety with mobility OT goals addressed during session: ADL's and self-care       End of Session   Activity Tolerance: Patient limited by fatigue Patient left: in bed;with call bell/phone within reach Nurse Communication: Mobility status         Time: 0454-0981 PT Time Calculation (min) (ACUTE ONLY): 20 min   Charges:   PT Evaluation $Initial PT Evaluation Tier I: 1 Procedure     PT G Codes:        BRADSHAW,HUNTER 08/16/2015, 4:09 PM

## 2015-07-21 NOTE — Progress Notes (Signed)
[name] PROGRESS NOTE  Brenda Cook HYQ:[id number] DOB: 07/16/1921 DOA: 07/19/2015 PCP: [name], MD  Assessment/Plan: Principal Problem:   Dehydration Active Problems:   Essential hypertension   SICK SINUS/ TACHY-BRADY SYNDROME   PACEMAKER   UTI (lower urinary tract infection)   1. Dehydration and failure to thrive: Likely secondary to urinary tract infectionand poor po intake from  Cognitive impairment with  decline over the last few weeks. Ordered PT/OT consult Continue hydration with normal saline, hold diuretics.    2. Inability to swallow: Cleared by speech therapy today  Started on a regular diet with thin liquids. Monitor.    3. Urinary tract infection,-continue with Rocephin, urine cultures ae pending. Last cultures from may grew proteus and sensitive to rocephin.    4. Atrial fibrillation with pacemaker: Rhythm is paced on EKG, Continue metoprolol, and aspirin  5. HTN: continue metoprolol, better controlled.   6. GERD (gastroesophageal reflux disease)  7.CVA - aspirin,  8. Hypokalemia repleted as needed. Repeat in am.  Get magnesium levels.   9. c diff antigen positive but toxin is negative: Probably a colonization. She was started on po vancomycin earlier this am after she had many bouts of diarrhea.  She is not on any laxatives that would explain her loose bowel movements. Would continue the vancomycin for now .   DVT prophylaxsis Lovenox  Code Status:      Code Status Orders        Start     Ordered   07/20/15 0022  Do not attempt resuscitation (DNR)   Continuous    Question Answer Comment  In the event of cardiac or respiratory ARREST Do not call a "code blue"   In the event of cardiac or respiratory ARREST Do not perform Intubation, CPR, defibrillation or ACLS   In the event of cardiac or respiratory ARREST Use medication by any route, position, wound care, and other measures to relive pain and suffering. May use  oxygen, suction and manual treatment of airway obstruction as needed for comfort.      07/20/15 0021    Advance Directive Documentation        Most Recent Value   Type of Advance Directive  Living will, Healthcare Power of Attorney   Pre-existing out of facility DNR order (yellow form or pink MOST form)     "MOST" Form in Place?       Family Communication: family updated about patient's clinical progress Disposition Plan:  As above    Brief narrative: Brenda Cook is a 81 y.o. female with a past medical history significant for atrial fibrillation, not on anticoagulation, chronic diastolic heart failure, tachybradycardia syndrome, with pacer, and recent severe sepsis from pyelonephritis in May 2016 who presents with subacute weakness and inability to take anything by mouth.  All history is collected from the patient's daughter who is a reliable historian and with whom she lives. At baseline the patient has not been able to walk since last December. She had a fall around that time, broke her right humerus and was put in rehabilitation. She had an episode of severe sepsis in May of this year when she was found to have a left obstructing stone, a Proteus UTI, and severe sepsis. She subsequently had a stent placed and had an uneventful recovery until the last few weeks.  Now, the daughter describes how the patient returned home from a SNF around July, but over the last 2-3 weeks  she has noticed that her mother refuses to eat anything more than a small bite, refuses to take any of her medicines, has been less communicative even than before, and is more weak than usual. The daughter has tried many different consistencies of food but none of them successful. This week she began to suspect that her mother had a UTI, so she brought her to Dr. [name] office, where the urologist performed a urine cath suggested they go to the ER if her mother became sicker. Subsequently her mother started vomiting and  so she presented to the ER.   In the ED, the patient was afebrile, and hemodynamically stable. She had no leukocytosis, but a cath urine specimen had copious white blood cells. She was given IV fluids, started on ceftriaxone and prepared for admission to [facility].  Consultants:  None  Procedures:  None  Antibiotics: Anti-infectives    Start     Dose/Rate Route Frequency Ordered Stop   07/21/15 0615  vancomycin (VANCOCIN) 50 mg/mL oral solution 125 mg     125 mg Oral 4 times per day 07/21/15 0609     07/20/15 2200  cefTRIAXone (ROCEPHIN) 1 g in dextrose 5 % 50 mL IVPB     1 g 100 mL/hr over 30 Minutes Intravenous Every 24 hours 07/20/15 0323     07/19/15 2245  cefTRIAXone (ROCEPHIN) 1 g in dextrose 5 % 50 mL IVPB     1 g 100 mL/hr over 30 Minutes Intravenous  Once 07/19/15 2241 07/19/15 2350         HPI/Subjective: Appears to be calm . With her family at bedside.   Objective: Filed Vitals:   07/20/15 1440 07/20/15 2123 07/21/15 0636 07/21/15 1033  BP: 146/72 154/73 178/73 180/88  Pulse: 76 70 78 95  Temp: 97.6 F (36.4 C) 98.4 F (36.9 C) 98 F (36.7 C)   TempSrc: Oral Oral Oral   Resp: 16 16 16    Height:      Weight:      SpO2: 98% 97% 99%     Intake/Output Summary (Last 24 hours) at 07/21/15 1339 Last data filed at 07/21/15 1200  Gross per 24 hour  Intake 1682.5 ml  Output   1900 ml  Net -217.5 ml    Exam: General: Elderly adult female, sluggish but in no obvious distress. Hard of hearing.  Skin: Warm and dry. Decreased turgor. No jaundice.  Cardiac: RRR, nl S1-S2, no murmurs appreciated. JVP not visible. No LE edema. Respiratory: Normal respiratory rate and rhythm. CTAB without rales or wheezes. Abdomen: BS present.non tender non distended.  No masses or organomegaly.  Neuro: Oriented to person only. no new focal deficits.   Data Review   Micro Results Recent Results (from the past 240 hour(s))  C difficile quick scan w PCR  reflex     Status: Abnormal   Collection Time: 07/21/15  2:02 AM  Result Value Ref Range Status   C Diff antigen POSITIVE (A) NEGATIVE Final   C Diff toxin NEGATIVE NEGATIVE Final   C Diff interpretation   Final    C. difficile present, but toxin not detected. This indicates colonization. In most cases, this does not require treatment. If patient has signs and symptoms consistent with colitis, consider treatment.    Radiology Reports Dg Chest 2 View  07/19/2015   CLINICAL DATA:  One month history of weakness and anorexia.  EXAM: CHEST  2 VIEW  COMPARISON:  03/22/2015  FINDINGS: The heart is  mildly enlarged but stable. There is tortuosity and mild ectasia of the thoracic aorta. Chronic bronchitic type lung changes along with low lung volumes and vascular crowding. No infiltrates or effusions. No pneumothorax. The bony thorax is grossly intact. Remote compression fractures are noted.  IMPRESSION: Mild cardiac enlargement and chronic lung changes but no acute pulmonary findings.   Electronically Signed   By: Rudie Meyer M.D.   On: 07/19/2015 17:26   Dg Abd 1 View  07/19/2015   CLINICAL DATA:  Nephrolithiasis  EXAM: ABDOMEN - 1 VIEW  COMPARISON:  06/14/2015  FINDINGS: Numerous pelvic phleboliths.  Calcifications project over the RIGHT kidney but uncertain whether these represent small kidney stones or related to overlying costal cartilage.  Largest of these measures 6 mm diameter.  Slightly increased stool in rectum.  Bowel gas pattern otherwise normal.  Scattered atherosclerotic calcifications.  Severe osseous demineralization with pronounced levoconvex thoracolumbar scoliosis.  Pacemaker leads project over RIGHT atrium and RIGHT ventricle.  IMPRESSION: Questionable nonobstructing RIGHT renal calculi versus superimposed artifacts from costal cartilage.   Electronically Signed   By: Ulyses Southward M.D.   On: 07/19/2015 23:30   Ct Head Wo Contrast  07/19/2015   CLINICAL DATA:  Right eye drooping since  yesterday.  Not eating.  EXAM: CT HEAD WITHOUT CONTRAST  TECHNIQUE: Contiguous axial images were obtained from the base of the skull through the vertex without intravenous contrast.  COMPARISON:  02/14/2004.  FINDINGS: Diffusely enlarged ventricles and subarachnoid spaces. Patchy white matter low density in both cerebral hemispheres. No intracranial hemorrhage, mass lesion or CT evidence of acute infarction. Unremarkable bones and included paranasal sinuses.  IMPRESSION: 1. No acute abnormality. 2. Moderate diffuse cerebral and cerebellar atrophy with progression. 3. Moderate chronic small vessel white matter ischemic changes in both cerebral hemispheres with progression.   Electronically Signed   By: Beckie Salts M.D.   On: 07/19/2015 15:39     CBC  Recent Labs Lab 07/19/15 1543 07/20/15 0500 07/21/15 0555  WBC 9.3 6.8 5.7  HGB 10.8* 10.0* 9.1*  HCT 31.7* 28.9* 26.9*  PLT 179 200 201  MCV 87.3 88.7 87.9  MCH 29.8 30.7 29.7  MCHC 34.1 34.6 33.8  RDW 14.5 14.5 14.4  LYMPHSABS 0.7  --   --   MONOABS 0.4  --   --   EOSABS 0.0  --   --   BASOSABS 0.0  --   --     Chemistries   Recent Labs Lab 07/19/15 1543 07/20/15 0500 07/21/15 0555  NA 138 141 139  K 3.8 3.3* 3.1*  CL 103 108 109  CO2 25 23 21*  GLUCOSE 150* 93 105*  BUN CREATININE 0.79 0.50 0.53  CALCIUM 9.8 9.4 9.2  AST 21  --  20  ALT 11*  --  12*  ALKPHOS 72  --  63  BILITOT 0.9  --  0.4   ------------------------------------------------------------------------------------------------------------------ estimated creatinine clearance is 39.6 mL/min (by C-G formula based on Cr of 0.53). ------------------------------------------------------------------------------------------------------------------ No results for input(s): HGBA1C in the last 72 hours. ------------------------------------------------------------------------------------------------------------------ No results for input(s): CHOL, HDL,  LDLCALC, TRIG, CHOLHDL, LDLDIRECT in the last 72 hours. ------------------------------------------------------------------------------------------------------------------  Recent Labs  07/19/15 1543  TSH 1.107   ------------------------------------------------------------------------------------------------------------------ No results for input(s): VITAMINB12, FOLATE, FERRITIN, TIBC, IRON, RETICCTPCT in the last 72 hours.  Coagulation profile No results for input(s): INR, PROTIME in the last 168 hours.  No results for input(s): DDIMER in the last 72 hours.  Cardiac Enzymes No results for input(s): CKMB, TROPONINI, MYOGLOBIN in the last 168 hours.  Invalid input(s): CK ------------------------------------------------------------------------------------------------------------------ Invalid input(s): POCBNP   CBG: No results for input(s): GLUCAP in the last 168 hours.     Studies: Dg Chest 2 View  07/19/2015   CLINICAL DATA:  One month history of weakness and anorexia.  EXAM: CHEST  2 VIEW  COMPARISON:  03/22/2015  FINDINGS: The heart is mildly enlarged but stable. There is tortuosity and mild ectasia of the thoracic aorta. Chronic bronchitic type lung changes along with low lung volumes and vascular crowding. No infiltrates or effusions. No pneumothorax. The bony thorax is grossly intact. Remote compression fractures are noted.  IMPRESSION: Mild cardiac enlargement and chronic lung changes but no acute pulmonary findings.   Electronically Signed   By: Rudie Meyer M.D.   On: 07/19/2015 17:26   Dg Abd 1 View  07/19/2015   CLINICAL DATA:  Nephrolithiasis  EXAM: ABDOMEN - 1 VIEW  COMPARISON:  06/14/2015  FINDINGS: Numerous pelvic phleboliths.  Calcifications project over the RIGHT kidney but uncertain whether these represent small kidney stones or related to overlying costal cartilage.  Largest of these measures 6 mm diameter.  Slightly increased stool in rectum.  Bowel gas pattern  otherwise normal.  Scattered atherosclerotic calcifications.  Severe osseous demineralization with pronounced levoconvex thoracolumbar scoliosis.  Pacemaker leads project over RIGHT atrium and RIGHT ventricle.  IMPRESSION: Questionable nonobstructing RIGHT renal calculi versus superimposed artifacts from costal cartilage.   Electronically Signed   By: Ulyses Southward M.D.   On: 07/19/2015 23:30   Ct Head Wo Contrast  07/19/2015   CLINICAL DATA:  Right eye drooping since yesterday.  Not eating.  EXAM: CT HEAD WITHOUT CONTRAST  TECHNIQUE: Contiguous axial images were obtained from the base of the skull through the vertex without intravenous contrast.  COMPARISON:  02/14/2004.  FINDINGS: Diffusely enlarged ventricles and subarachnoid spaces. Patchy white matter low density in both cerebral hemispheres. No intracranial hemorrhage, mass lesion or CT evidence of acute infarction. Unremarkable bones and included paranasal sinuses.  IMPRESSION: 1. No acute abnormality. 2. Moderate diffuse cerebral and cerebellar atrophy with progression. 3. Moderate chronic small vessel white matter ischemic changes in both cerebral hemispheres with progression.   Electronically Signed   By: Beckie Salts M.D.   On: 07/19/2015 15:39      No results found for: HGBA1C Lab Results  Component Value Date   CREATININE 0.53 07/21/2015       Scheduled Meds: . aspirin EC  81 mg Oral Q breakfast  . cefTRIAXone (ROCEPHIN)  IV  1 g Intravenous Q24H  . enoxaparin (LOVENOX) injection  40 mg Subcutaneous Q24H  . feeding supplement (ENSURE ENLIVE)  237 mL Oral BID BM  . metoprolol succinate  12.5 mg Oral Daily  . pantoprazole  40 mg Oral Q breakfast  . sertraline  100 mg Oral Daily  . vancomycin  125 mg Oral 4 times per day   Continuous Infusions: . 0.9 % NaCl with KCl 20 mEq / L 50 mL/hr at 07/20/15 2356    Principal Problem:   Dehydration Active Problems:   Essential hypertension   SICK SINUS/ TACHY-BRADY SYNDROME    PACEMAKER   UTI (lower urinary tract infection)    Time spent: 25 minutes   [facility]  [facility] Pager [phone]. If 7PM-7AM, please contact night-coverage at [email or web address], password [facility] 07/21/2015, 1:39 PM  LOS: 2 days

## 2015-07-22 DIAGNOSIS — N200 Calculus of kidney: Secondary | ICD-10-CM | POA: Diagnosis present

## 2015-07-22 DIAGNOSIS — I495 Sick sinus syndrome: Secondary | ICD-10-CM

## 2015-07-22 DIAGNOSIS — Z950 Presence of cardiac pacemaker: Secondary | ICD-10-CM

## 2015-07-22 LAB — BASIC METABOLIC PANEL
ANION GAP: 10 (ref 5–15)
BUN: 6 mg/dL (ref 6–20)
CHLORIDE: 111 mmol/L (ref 101–111)
CO2: 20 mmol/L — ABNORMAL LOW (ref 22–32)
Calcium: 9.5 mg/dL (ref 8.9–10.3)
Creatinine, Ser: 0.43 mg/dL — ABNORMAL LOW (ref 0.44–1.00)
GFR calc Af Amer: >60 mL/min (ref >60)
Glucose, Bld: 101 mg/dL — ABNORMAL HIGH (ref 65–99)
POTASSIUM: 3 mmol/L — AB (ref 3.5–5.1)
SODIUM: 141 mmol/L (ref 135–145)

## 2015-07-22 LAB — CBC
HCT: 29.2 % — ABNORMAL LOW (ref 36.0–46.0)
HEMOGLOBIN: 10.2 g/dL — AB (ref 12.0–15.0)
MCH: 30.4 pg (ref 26.0–34.0)
MCHC: 34.9 g/dL (ref 30.0–36.0)
MCV: 87.2 fL (ref 78.0–100.0)
PLATELETS: 261 10*3/uL (ref 150–400)
RBC: 3.35 MIL/uL — AB (ref 3.87–5.11)
RDW: 14.5 % (ref 11.5–15.5)
WBC: 6.8 10*3/uL (ref 4.0–10.5)

## 2015-07-22 LAB — URINE CULTURE

## 2015-07-22 LAB — MAGNESIUM: MAGNESIUM: 1.6 mg/dL — AB (ref 1.7–2.4)

## 2015-07-22 MED ORDER — MAGNESIUM SULFATE 2 GM/50ML IV SOLN
2.0000 g | Freq: Once | INTRAVENOUS | Status: AC
  Administered 2015-07-22: 2 g via INTRAVENOUS
  Filled 2015-07-22: qty 50

## 2015-07-22 MED ORDER — AMPICILLIN 500 MG PO CAPS
500.0000 mg | ORAL_CAPSULE | Freq: Three times a day (TID) | ORAL | Status: DC
  Administered 2015-07-22 – 2015-07-24 (×7): 500 mg via ORAL
  Filled 2015-07-22 (×9): qty 1

## 2015-07-22 MED ORDER — POTASSIUM CHLORIDE CRYS ER 20 MEQ PO TBCR
40.0000 meq | EXTENDED_RELEASE_TABLET | Freq: Three times a day (TID) | ORAL | Status: AC
  Administered 2015-07-22 (×3): 40 meq via ORAL
  Filled 2015-07-22 (×3): qty 2

## 2015-07-22 MED ORDER — LORAZEPAM 2 MG/ML IJ SOLN
0.5000 mg | Freq: Once | INTRAMUSCULAR | Status: AC
  Administered 2015-07-22: 0.5 mg via INTRAVENOUS
  Filled 2015-07-22: qty 1

## 2015-07-22 MED ORDER — SODIUM CHLORIDE 0.9 % IV SOLN
INTRAVENOUS | Status: DC
  Administered 2015-07-22 – 2015-07-23 (×2): via INTRAVENOUS
  Filled 2015-07-22 (×4): qty 1000

## 2015-07-22 MED ORDER — POTASSIUM CHLORIDE 10 MEQ/100ML IV SOLN
10.0000 meq | INTRAVENOUS | Status: AC
  Administered 2015-07-22: 10 meq via INTRAVENOUS
  Filled 2015-07-22 (×2): qty 100

## 2015-07-22 NOTE — Progress Notes (Addendum)
[name] PROGRESS NOTE  Brenda Cook ZOX:[id number] DOB: 1921-05-17 DOA: 07/19/2015 PCP: [name], MD  Assessment/Plan: Principal Problem:   Dehydration Active Problems:   Essential hypertension   SICK SINUS/ TACHY-BRADY SYNDROME   PACEMAKER   UTI (lower urinary tract infection)   Nephrolithiasis   1. Dehydration and failure to thrive: Likely secondary to urinary tract infectionand poor po intake from  Cognitive impairment with  decline over the last few weeks. As per physical therapy evaluation, no PT  follow-up needed, Continue hydration with normal saline, hold diuretics.    2. Inability to swallow: Cleared by speech therapy today  Started on a regular diet with thin liquids. Monitor.    3. Urinary tract infection,- urine cultures shows enterococcus and proteus sensitive to ampicillin,rocephine DC'd  Start  ampicillin 500 TID    5 days at discharge   4. Atrial fibrillation with pacemaker: Rhythm is paced on EKG, Continue metoprolol, and aspirin  5. HTN: continue metoprolol, better controlled.   6. GERD (gastroesophageal reflux disease)  7.CVA - aspirin,  8. Hypokalemia replete  . Repeat in am.  Magnesium 1.6, will replete. We will replete  9. Diarrhea  c diff antigen positive but toxin is negative: Probably a colonization. She was started on po vancomycin earlier this am after she had many bouts of diarrhea.  She is not on any laxatives that would explain her loose bowel movements. Would continue the vancomycin for now .   DVT prophylaxsis Lovenox  Code Status:      Code Status Orders        Start     Ordered   07/20/15 0022  Do not attempt resuscitation (DNR)   Continuous    Question Answer Comment  In the event of cardiac or respiratory ARREST Do not call a "code blue"   In the event of cardiac or respiratory ARREST Do not perform Intubation, CPR, defibrillation or ACLS   In the event of cardiac or respiratory ARREST Use  medication by any route, position, wound care, and other measures to relive pain and suffering. May use oxygen, suction and manual treatment of airway obstruction as needed for comfort.      07/20/15 0021    Advance Directive Documentation        Most Recent Value   Type of Advance Directive  Living will, Healthcare Power of Attorney   Pre-existing out of facility DNR order (yellow form or pink MOST form)     "MOST" Form in Place?       Family Communication: family updated about patient's clinical progress Disposition Plan:  Anticipate dc in am    Brief narrative: Brenda Cook is a 81 y.o. female with a past medical history significant for atrial fibrillation, not on anticoagulation, chronic diastolic heart failure, tachybradycardia syndrome, with pacer, and recent severe sepsis from pyelonephritis in May 2016 who presents with subacute weakness and inability to take anything by mouth.  All history is collected from the patient's daughter who is a reliable historian and with whom she lives. At baseline the patient has not been able to walk since last December. She had a fall around that time, broke her right humerus and was put in rehabilitation. She had an episode of severe sepsis in May of this year when she was found to have a left obstructing stone, a Proteus UTI, and severe sepsis. She subsequently had a stent placed and had an uneventful recovery until the last  few weeks.  Now, the daughter describes how the patient returned home from a SNF around July, but over the last 2-3 weeks she has noticed that her mother refuses to eat anything more than a small bite, refuses to take any of her medicines, has been less communicative even than before, and is more weak than usual. The daughter has tried many different consistencies of food but none of them successful. This week she began to suspect that her mother had a UTI, so she brought her to Dr. [name] office, where the urologist performed a  urine cath suggested they go to the ER if her mother became sicker. Subsequently her mother started vomiting and so she presented to the ER.   In the ED, the patient was afebrile, and hemodynamically stable. She had no leukocytosis, but a cath urine specimen had copious white blood cells. She was given IV fluids, started on ceftriaxone for UTI.   Consultants:  None  Procedures:  None  Antibiotics: Anti-infectives    Start     Dose/Rate Route Frequency Ordered Stop   07/21/15 0615  vancomycin (VANCOCIN) 50 mg/mL oral solution 125 mg     125 mg Oral 4 times per day 07/21/15 0609     07/20/15 2200  cefTRIAXone (ROCEPHIN) 1 g in dextrose 5 % 50 mL IVPB     1 g 100 mL/hr over 30 Minutes Intravenous Every 24 hours 07/20/15 0323     07/19/15 2245  cefTRIAXone (ROCEPHIN) 1 g in dextrose 5 % 50 mL IVPB     1 g 100 mL/hr over 30 Minutes Intravenous  Once 07/19/15 2241 07/19/15 2350         HPI/Subjective: Denies any nausea vomiting, hypertensive overnight, diarrheas improving  Objective: Filed Vitals:   07/21/15 1033 07/21/15 1439 07/21/15 2306 07/22/15 0535  BP: 180/88 153/90 172/72 167/79  Pulse: 95 70 72 81  Temp:  98.2 F (36.8 C) 98.8 F (37.1 C) 99.5 F (37.5 C)  TempSrc:  Oral Oral Oral  Resp:  Height:      Weight:      SpO2:  95% 96% 95%    Intake/Output Summary (Last 24 hours) at 07/22/15 0959 Last data filed at 07/22/15 0600  Gross per 24 hour  Intake   1200 ml  Output   2950 ml  Net  -1750 ml    Exam: General: Elderly adult female, sluggish but in no obvious distress. Hard of hearing.  Skin: Warm and dry. Decreased turgor. No jaundice.  Cardiac: RRR, nl S1-S2, no murmurs appreciated. JVP not visible. No LE edema. Respiratory: Normal respiratory rate and rhythm. CTAB without rales or wheezes. Abdomen: BS present.non tender non distended.  No masses or organomegaly.  Neuro: Oriented to person only. no new focal deficits.   Data  Review   Micro Results Recent Results (from the past 240 hour(s))  Urine culture     Status: None (Preliminary result)   Collection Time: 07/19/15  9:43 PM  Result Value Ref Range Status   Specimen Description URINE, CATHETERIZED  Final   Special Requests NONE  Final   Culture   Final    60,000 COLONIES/ml ENTEROCOCCUS SPECIES 20,000 COLONIES/mL GRAM NEGATIVE RODS Performed at [facility]    Report Status PENDING  Incomplete  C difficile quick scan w PCR reflex     Status: Abnormal   Collection Time: 07/21/15  2:02 AM  Result Value Ref Range Status   C Diff antigen  POSITIVE (A) NEGATIVE Final   C Diff toxin NEGATIVE NEGATIVE Final   C Diff interpretation   Final    C. difficile present, but toxin not detected. This indicates colonization. In most cases, this does not require treatment. If patient has signs and symptoms consistent with colitis, consider treatment.    Radiology Reports Dg Chest 2 View  07/19/2015   CLINICAL DATA:  One month history of weakness and anorexia.  EXAM: CHEST  2 VIEW  COMPARISON:  03/22/2015  FINDINGS: The heart is mildly enlarged but stable. There is tortuosity and mild ectasia of the thoracic aorta. Chronic bronchitic type lung changes along with low lung volumes and vascular crowding. No infiltrates or effusions. No pneumothorax. The bony thorax is grossly intact. Remote compression fractures are noted.  IMPRESSION: Mild cardiac enlargement and chronic lung changes but no acute pulmonary findings.   Electronically Signed   By: Rudie Meyer M.D.   On: 07/19/2015 17:26   Dg Abd 1 View  07/19/2015   CLINICAL DATA:  Nephrolithiasis  EXAM: ABDOMEN - 1 VIEW  COMPARISON:  06/14/2015  FINDINGS: Numerous pelvic phleboliths.  Calcifications project over the RIGHT kidney but uncertain whether these represent small kidney stones or related to overlying costal cartilage.  Largest of these measures 6 mm diameter.  Slightly increased stool in rectum.  Bowel gas  pattern otherwise normal.  Scattered atherosclerotic calcifications.  Severe osseous demineralization with pronounced levoconvex thoracolumbar scoliosis.  Pacemaker leads project over RIGHT atrium and RIGHT ventricle.  IMPRESSION: Questionable nonobstructing RIGHT renal calculi versus superimposed artifacts from costal cartilage.   Electronically Signed   By: Ulyses Southward M.D.   On: 07/19/2015 23:30   Ct Head Wo Contrast  07/19/2015   CLINICAL DATA:  Right eye drooping since yesterday.  Not eating.  EXAM: CT HEAD WITHOUT CONTRAST  TECHNIQUE: Contiguous axial images were obtained from the base of the skull through the vertex without intravenous contrast.  COMPARISON:  02/14/2004.  FINDINGS: Diffusely enlarged ventricles and subarachnoid spaces. Patchy white matter low density in both cerebral hemispheres. No intracranial hemorrhage, mass lesion or CT evidence of acute infarction. Unremarkable bones and included paranasal sinuses.  IMPRESSION: 1. No acute abnormality. 2. Moderate diffuse cerebral and cerebellar atrophy with progression. 3. Moderate chronic small vessel white matter ischemic changes in both cerebral hemispheres with progression.   Electronically Signed   By: Beckie Salts M.D.   On: 07/19/2015 15:39     CBC  Recent Labs Lab 07/19/15 1543 07/20/15 0500 07/21/15 0555 07/22/15 0436  WBC 9.3 6.8 5.7 6.8  HGB 10.8* 10.0* 9.1* 10.2*  HCT 31.7* 28.9* 26.9* 29.2*  PLT 179 200 201 261  MCV 87.3 88.7 87.9 87.2  MCH 29.8 30.7 29.7 30.4  MCHC 34.1 34.6 33.8 34.9  RDW 14.5 14.5 14.4 14.5  LYMPHSABS 0.7  --   --   --   MONOABS 0.4  --   --   --   EOSABS 0.0  --   --   --   BASOSABS 0.0  --   --   --     Chemistries   Recent Labs Lab 07/19/15 1543 07/20/15 0500 07/21/15 0555 07/22/15 0436  NA 138 141 139 141  K 3.8 3.3* 3.1* 3.0*  CL 103 108 109 111  CO2 25 23 21* 20*  GLUCOSE 150* 93 105* 101*  BUN CREATININE 0.79 0.50 0.53 0.43*  CALCIUM 9.8 9.4 9.2 9.5  MG  --    --   --  1.6*  AST 21  --  20  --   ALT 11*  --  12*  --   ALKPHOS 72  --  63  --   BILITOT 0.9  --  0.4  --    ------------------------------------------------------------------------------------------------------------------ estimated creatinine clearance is 39.6 mL/min (by C-G formula based on Cr of 0.43). ------------------------------------------------------------------------------------------------------------------ No results for input(s): HGBA1C in the last 72 hours. ------------------------------------------------------------------------------------------------------------------ No results for input(s): CHOL, HDL, LDLCALC, TRIG, CHOLHDL, LDLDIRECT in the last 72 hours. ------------------------------------------------------------------------------------------------------------------  Recent Labs  07/19/15 1543  TSH 1.107   ------------------------------------------------------------------------------------------------------------------ No results for input(s): VITAMINB12, FOLATE, FERRITIN, TIBC, IRON, RETICCTPCT in the last 72 hours.  Coagulation profile No results for input(s): INR, PROTIME in the last 168 hours.  No results for input(s): DDIMER in the last 72 hours.  Cardiac Enzymes No results for input(s): CKMB, TROPONINI, MYOGLOBIN in the last 168 hours.  Invalid input(s): CK ------------------------------------------------------------------------------------------------------------------ Invalid input(s): POCBNP   CBG: No results for input(s): GLUCAP in the last 168 hours.     Studies: No results found.    No results found for: HGBA1C Lab Results  Component Value Date   CREATININE 0.43* 07/22/2015       Scheduled Meds: . aspirin EC  81 mg Oral Q breakfast  . cefTRIAXone (ROCEPHIN)  IV  1 g Intravenous Q24H  . enoxaparin (LOVENOX) injection  40 mg Subcutaneous Q24H  . feeding supplement (ENSURE ENLIVE)  237 mL Oral BID BM  . metoprolol succinate   12.5 mg Oral Daily  . pantoprazole  40 mg Oral Q breakfast  . potassium chloride  10 mEq Intravenous Q1 Hr x 2  . potassium chloride  40 mEq Oral TID  . sertraline  100 mg Oral Daily  . vancomycin  125 mg Oral 4 times per day   Continuous Infusions: . 0.9 % sodium chloride with kcl      Principal Problem:   Dehydration Active Problems:   Essential hypertension   SICK SINUS/ TACHY-BRADY SYNDROME   PACEMAKER   UTI (lower urinary tract infection)   Nephrolithiasis    Time spent: 25 minutes   [facility]  [facility] Pager [phone]. If 7PM-7AM, please contact night-coverage at [email or web address], password [facility] 07/22/2015, 9:58 AM  LOS: 3 days

## 2015-07-23 DIAGNOSIS — R627 Adult failure to thrive: Secondary | ICD-10-CM

## 2015-07-23 LAB — CBC
HCT: 27.1 % — ABNORMAL LOW (ref 36.0–46.0)
HEMOGLOBIN: 9.2 g/dL — AB (ref 12.0–15.0)
MCH: 30.4 pg (ref 26.0–34.0)
MCHC: 33.9 g/dL (ref 30.0–36.0)
MCV: 89.4 fL (ref 78.0–100.0)
PLATELETS: 236 10*3/uL (ref 150–400)
RBC: 3.03 MIL/uL — AB (ref 3.87–5.11)
RDW: 15.1 % (ref 11.5–15.5)
WBC: 6.3 10*3/uL (ref 4.0–10.5)

## 2015-07-23 LAB — COMPREHENSIVE METABOLIC PANEL
ALT: 13 U/L — AB (ref 14–54)
ANION GAP: 4 — AB (ref 5–15)
AST: 23 U/L (ref 15–41)
Albumin: 2.8 g/dL — ABNORMAL LOW (ref 3.5–5.0)
Alkaline Phosphatase: 59 U/L (ref 38–126)
BUN: 9 mg/dL (ref 6–20)
CALCIUM: 9.4 mg/dL (ref 8.9–10.3)
CHLORIDE: 116 mmol/L — AB (ref 101–111)
CO2: 21 mmol/L — AB (ref 22–32)
CREATININE: 0.59 mg/dL (ref 0.44–1.00)
Glucose, Bld: 96 mg/dL (ref 65–99)
Potassium: 5.3 mmol/L — ABNORMAL HIGH (ref 3.5–5.1)
SODIUM: 141 mmol/L (ref 135–145)
Total Bilirubin: 0.3 mg/dL (ref 0.3–1.2)
Total Protein: 5.2 g/dL — ABNORMAL LOW (ref 6.5–8.1)

## 2015-07-23 MED ORDER — SODIUM CHLORIDE 0.9 % IV SOLN
INTRAVENOUS | Status: DC
  Administered 2015-07-23 – 2015-07-24 (×3): via INTRAVENOUS

## 2015-07-23 MED ORDER — LORAZEPAM 2 MG/ML IJ SOLN
0.5000 mg | Freq: Once | INTRAMUSCULAR | Status: AC
  Administered 2015-07-23: 0.5 mg via INTRAVENOUS
  Filled 2015-07-23: qty 1

## 2015-07-23 NOTE — Progress Notes (Signed)
Progress Note   Brenda Cook ZOX:[id number] DOB: 1921/01/02 DOA: 07/19/2015 PCP: [name], MD   Brief Narrative:   Brenda Cook is an 81 y.o. female with a PMH of atrial fibrillation not on anticoagulation, chronic diastolic CHF, tachybradycardia syndrome status post pacemaker, and hospitalization for severe sepsis secondary to complicated pyelonephritis with nephrolithiasis requiring placement of a ureteral stent 02/2015, who was admitted with generalized weakness and poor oral intake. She has been non-ambulatory since 09/2014. She ultimately went to a SNF for rehabilitation and was discharged from there in July.  Assessment/Plan:   Principal Problem:   Acute urinary tract infection with dehydration and adult failure to thrive - Patient has had poor intake leading to dehydration. She is currently being hydrated. Diarrhetic therapy is on hold. - Urine cultures positive for enterococcus and Proteus. Continue ampicillin.  Active Problems:   Essential hypertension - Reasonable on metoprolol. Systolic pressures as high as 811B.    SICK SINUS/ TACHY-BRADY SYNDROME/PACEMAKER/history of CVA - Continue metoprolol and aspirin.    Nephrolithiasis - Nonobstructing. Incidentally noted on plain abdominal films.    Hypokalemia/hyperkalemia - Initially hypokalemic, potassium elevated this morning in the setting of aggressive replacement. - Discontinue potassium from IV fluids.    Diarrhea - C. difficile antigen positive but toxin negative, consistent with colonization. - Likely antibiotics associated diarrhea. WBC WNL.    DVT Prophylaxis - Lovenox ordered.  Family Communication: No family currently at the bedside. Disposition Plan: Home when stable. Will not qualify for SNF. Code Status:     Code Status Orders        Start     Ordered   07/20/15 0022  Do not attempt resuscitation (DNR)   Continuous    Question Answer Comment  In the event of cardiac or respiratory  ARREST Do not call a "code blue"   In the event of cardiac or respiratory ARREST Do not perform Intubation, CPR, defibrillation or ACLS   In the event of cardiac or respiratory ARREST Use medication by any route, position, wound care, and other measures to relive pain and suffering. May use oxygen, suction and manual treatment of airway obstruction as needed for comfort.      07/20/15 0021    Advance Directive Documentation        Most Recent Value   Type of Advance Directive  Living will, Healthcare Power of Attorney   Pre-existing out of facility DNR order (yellow form or pink MOST form)     "MOST" Form in Place?          IV Access:    Peripheral IV   Procedures and diagnostic studies:   Dg Chest 2 View  07/19/2015   CLINICAL DATA:  One month history of weakness and anorexia.  EXAM: CHEST  2 VIEW  COMPARISON:  03/22/2015  FINDINGS: The heart is mildly enlarged but stable. There is tortuosity and mild ectasia of the thoracic aorta. Chronic bronchitic type lung changes along with low lung volumes and vascular crowding. No infiltrates or effusions. No pneumothorax. The bony thorax is grossly intact. Remote compression fractures are noted.  IMPRESSION: Mild cardiac enlargement and chronic lung changes but no acute pulmonary findings.   Electronically Signed   By: Rudie Meyer M.D.   On: 07/19/2015 17:26   Dg Abd 1 View  07/19/2015   CLINICAL DATA:  Nephrolithiasis  EXAM: ABDOMEN - 1 VIEW  COMPARISON:  06/14/2015  FINDINGS: Numerous pelvic phleboliths.  Calcifications project over the  RIGHT kidney but uncertain whether these represent small kidney stones or related to overlying costal cartilage.  Largest of these measures 6 mm diameter.  Slightly increased stool in rectum.  Bowel gas pattern otherwise normal.  Scattered atherosclerotic calcifications.  Severe osseous demineralization with pronounced levoconvex thoracolumbar scoliosis.  Pacemaker leads project over RIGHT atrium and RIGHT  ventricle.  IMPRESSION: Questionable nonobstructing RIGHT renal calculi versus superimposed artifacts from costal cartilage.   Electronically Signed   By: Ulyses Southward M.D.   On: 07/19/2015 23:30   Ct Head Wo Contrast  07/19/2015   CLINICAL DATA:  Right eye drooping since yesterday.  Not eating.  EXAM: CT HEAD WITHOUT CONTRAST  TECHNIQUE: Contiguous axial images were obtained from the base of the skull through the vertex without intravenous contrast.  COMPARISON:  02/14/2004.  FINDINGS: Diffusely enlarged ventricles and subarachnoid spaces. Patchy white matter low density in both cerebral hemispheres. No intracranial hemorrhage, mass lesion or CT evidence of acute infarction. Unremarkable bones and included paranasal sinuses.  IMPRESSION: 1. No acute abnormality. 2. Moderate diffuse cerebral and cerebellar atrophy with progression. 3. Moderate chronic small vessel white matter ischemic changes in both cerebral hemispheres with progression.   Electronically Signed   By: Beckie Salts M.D.   On: 07/19/2015 15:39     Medical Consultants:    None.  Anti-Infectives:   Anti-infectives    Start     Dose/Rate Route Frequency Ordered Stop   07/22/15 1430  ampicillin (PRINCIPEN) capsule 500 mg     500 mg Oral 3 times per day 07/22/15 1348     07/21/15 0615  vancomycin (VANCOCIN) 50 mg/mL oral solution 125 mg     125 mg Oral 4 times per day 07/21/15 0609     07/20/15 2200  cefTRIAXone (ROCEPHIN) 1 g in dextrose 5 % 50 mL IVPB  Status:  Discontinued     1 g 100 mL/hr over 30 Minutes Intravenous Every 24 hours 07/20/15 0323 07/22/15 1345   07/19/15 2245  cefTRIAXone (ROCEPHIN) 1 g in dextrose 5 % 50 mL IVPB     1 g 100 mL/hr over 30 Minutes Intravenous  Once 07/19/15 2241 07/19/15 2350      Subjective:   Brenda Cook is confused, and difficult to understand.  Stated "Where am I?" but unable to clearly answer my questions about her current symptoms, but did tell me "no" when asked specifically about  pain, diarrhea and nausea.  Objective:    Filed Vitals:   07/22/15 0535 07/22/15 1412 07/22/15 2250 07/23/15 0553  BP: 167/79 160/67 154/73 134/65  Pulse: 81 69 70 70  Temp: 99.5 F (37.5 C) 99.5 F (37.5 C) 99 F (37.2 C) 99.1 F (37.3 C)  TempSrc: Oral Oral Oral Axillary  Resp: Height:      Weight:      SpO2: 95% 97% 97% 98%    Intake/Output Summary (Last 24 hours) at 07/23/15 7829 Last data filed at 07/23/15 0600  Gross per 24 hour  Intake   2460 ml  Output   1350 ml  Net   1110 ml   Filed Weights   07/20/15 0016  Weight: 59 kg (130 lb 1.1 oz)    Exam: Gen:  NAD, confused/disoriented but calm Cardiovascular:  RRR, No M/R/G Respiratory:  Lungs diminished Gastrointestinal:  Abdomen soft, NT/ND, + BS Extremities:  No C/E/C, soft silicone dressings right ankle/heel   Data Reviewed:    Labs: Basic Metabolic Panel:  Recent Labs Lab 07/19/15 1543 07/20/15 0500 07/21/15 0555 07/22/15 0436 07/23/15 0450  NA 138 141 139 141 141  K 3.8 3.3* 3.1* 3.0* 5.3*  CL 103 108 109 111 116*  CO2 25 23 21* 20* 21*  GLUCOSE 150* 93 105* 101* 96  BUN CREATININE 0.79 0.50 0.53 0.43* 0.59  CALCIUM 9.8 9.4 9.2 9.5 9.4  MG  --   --   --  1.6*  --    GFR Estimated Creatinine Clearance: 39.6 mL/min (by C-G formula based on Cr of 0.59). Liver Function Tests:  Recent Labs Lab 07/19/15 1543 07/21/15 0555 07/23/15 0450  AST ALT 11* 12* 13*  ALKPHOS 72 63 59  BILITOT 0.9 0.4 0.3  PROT 6.4* 5.3* 5.2*  ALBUMIN 3.3* 2.9* 2.8*   CBC:  Recent Labs Lab 07/19/15 1543 07/20/15 0500 07/21/15 0555 07/22/15 0436 07/23/15 0450  WBC 9.3 6.8 5.7 6.8 6.3  NEUTROABS 8.1*  --   --   --   --   HGB 10.8* 10.0* 9.1* 10.2* 9.2*  HCT 31.7* 28.9* 26.9* 29.2* 27.1*  MCV 87.3 88.7 87.9 87.2 89.4  PLT 179 200 201 261 236   Microbiology Recent Results (from the past 240 hour(s))  Urine culture     Status: None   Collection Time: 07/19/15   9:43 PM  Result Value Ref Range Status   Specimen Description URINE, CATHETERIZED  Final   Special Requests NONE  Final   Culture   Final    60,000 COLONIES/ml ENTEROCOCCUS SPECIES 20,000 COLONIES/mL PROTEUS MIRABILIS Performed at [facility]    Report Status 07/22/2015 FINAL  Final   Organism ID, Bacteria ENTEROCOCCUS SPECIES  Final   Organism ID, Bacteria PROTEUS MIRABILIS  Final      Susceptibility   Proteus mirabilis - MIC*    AMPICILLIN <=2 SENSITIVE Sensitive     CEFAZOLIN <=4 SENSITIVE Sensitive     CEFTRIAXONE <=1 SENSITIVE Sensitive     CIPROFLOXACIN <=0.25 SENSITIVE Sensitive     GENTAMICIN <=1 SENSITIVE Sensitive     IMIPENEM 1 SENSITIVE Sensitive     NITROFURANTOIN 64 RESISTANT Resistant     TRIMETH/SULFA <=20 SENSITIVE Sensitive     AMPICILLIN/SULBACTAM <=2 SENSITIVE Sensitive     PIP/TAZO <=4 SENSITIVE Sensitive     * 20,000 COLONIES/mL PROTEUS MIRABILIS   Enterococcus species - MIC*    AMPICILLIN <=2 SENSITIVE Sensitive     LEVOFLOXACIN >=8 RESISTANT Resistant     NITROFURANTOIN <=16 SENSITIVE Sensitive     VANCOMYCIN 1 SENSITIVE Sensitive     * 60,000 COLONIES/ml ENTEROCOCCUS SPECIES  C difficile quick scan w PCR reflex     Status: Abnormal   Collection Time: 07/21/15  2:02 AM  Result Value Ref Range Status   C Diff antigen POSITIVE (A) NEGATIVE Final   C Diff toxin NEGATIVE NEGATIVE Final   C Diff interpretation   Final    C. difficile present, but toxin not detected. This indicates colonization. In most cases, this does not require treatment. If patient has signs and symptoms consistent with colitis, consider treatment.     Medications:   . ampicillin  500 mg Oral 3 times per day  . aspirin EC  81 mg Oral Q breakfast  . enoxaparin (LOVENOX) injection  40 mg Subcutaneous Q24H  . feeding supplement (ENSURE ENLIVE)  237 mL Oral BID BM  . metoprolol succinate  12.5 mg Oral Daily  . pantoprazole  40 mg Oral Q breakfast  . sertraline  100 mg Oral  Daily  . vancomycin  125 mg Oral 4 times per day   Continuous Infusions: . 0.9 % sodium chloride with kcl 75 mL/hr at 07/23/15 0135    Time spent: 25 minutes.   LOS: 4 days   [place]  [facility] Pager [phone]. If unable to reach me by pager, please call my cell phone at [phone].  *Please refer to [email or web address], password [email or web address] to get updated schedule on who will round on this patient, as hospitalists switch teams weekly. If 7PM-7AM, please contact night-coverage at [email or web address], password [email or web address] for any overnight needs.  07/23/2015, 8:11 AM

## 2015-07-23 NOTE — Care Management Note (Signed)
Case Management Note  Patient Details  Name: Brenda Cook MRN: [id number] Date of Birth: 1921/07/03  Subjective/Objective: 81 y/o f admitted w/UTI. C diff, diarrhea.vanc po. Reg-thin diet,ensure. PTA-total care, has hoyer lift.PT-no f/u-No skilled PT need.Spoke to dtr on phone-d/c plan home w/HHC, provided w/HHC agency. Await choice.Recommend HHRN, HH Nurse's aide. Await HHC order, face to face.Will need ambulance transp-CSW already following.                  Action/Plan:d/c home w/HHC.   Expected Discharge Date:  07/23/15               Expected Discharge Plan:  Home w Home Health Services  In-House Referral:  Clinical Social Work  Discharge planning Services  CM Consult  Post Acute Care Choice:  Home Health Choice offered to:  Patient  DME Arranged:  N/A DME Agency:  NA  HH Arranged:  NA HH Agency:  NA  Status of Service:  In process, will continue to follow  Medicare Important Message Given:  Yes-second notification given Date Medicare IM Given:    Medicare IM give by:    Date Additional Medicare IM Given:    Additional Medicare Important Message give by:     If discussed at Long Length of Stay Meetings, dates discussed:    Additional Comments:  Lanier Clam, RN 07/23/2015, 7:48 PM

## 2015-07-23 NOTE — Progress Notes (Signed)
CSW consulted to assist with [facility] services. RNCM will assist with d/c planning / HH services. CSW will assist with assist with [facility] transport home if requested.   Cori Razor LCSW [phone]

## 2015-07-23 NOTE — Care Management Important Message (Signed)
Important Message  Patient Details IM Letter given to Kathy/ Case Manager to present to PatientImportant Message  Patient Details  Name: TIEASHA LARSEN MRN: [id number] Date of Birth: 1921/08/04   Medicare Important Message Given:  Yes-second notification given    Haskell Flirt 07/23/2015, 12:47 PM Name: CARMON BRIGANDI MRN: [id number] Date of Birth: November 08, 1920   Medicare Important Message Given:  Yes-second notification given    Haskell Flirt 07/23/2015, 12:46 PM

## 2015-07-24 DIAGNOSIS — Z7189 Other specified counseling: Secondary | ICD-10-CM

## 2015-07-24 DIAGNOSIS — R197 Diarrhea, unspecified: Secondary | ICD-10-CM | POA: Diagnosis present

## 2015-07-24 DIAGNOSIS — Z515 Encounter for palliative care: Secondary | ICD-10-CM

## 2015-07-24 LAB — GI PATHOGEN PANEL BY PCR, STOOL
C DIFFICILE TOXIN A/B: NOT DETECTED
CRYPTOSPORIDIUM BY PCR: NOT DETECTED
Campylobacter by PCR: NOT DETECTED
E COLI (ETEC) LT/ST: NOT DETECTED
E COLI 0157 BY PCR: NOT DETECTED
E coli (STEC): NOT DETECTED
G LAMBLIA BY PCR: NOT DETECTED
Norovirus GI/GII: NOT DETECTED
Rotavirus A by PCR: NOT DETECTED
Salmonella by PCR: NOT DETECTED
Shigella by PCR: NOT DETECTED

## 2015-07-24 LAB — BASIC METABOLIC PANEL
Anion gap: 6 (ref 5–15)
BUN: 6 mg/dL (ref 6–20)
CHLORIDE: 110 mmol/L (ref 101–111)
CO2: 22 mmol/L (ref 22–32)
CREATININE: 0.47 mg/dL (ref 0.44–1.00)
Calcium: 9.3 mg/dL (ref 8.9–10.3)
GFR calc non Af Amer: >60 mL/min (ref >60)
GLUCOSE: 98 mg/dL (ref 65–99)
Potassium: 3.7 mmol/L (ref 3.5–5.1)
Sodium: 138 mmol/L (ref 135–145)

## 2015-07-24 MED ORDER — SACCHAROMYCES BOULARDII 250 MG PO CAPS
250.0000 mg | ORAL_CAPSULE | Freq: Two times a day (BID) | ORAL | Status: DC
  Administered 2015-07-24: 250 mg via ORAL
  Filled 2015-07-24 (×2): qty 1

## 2015-07-24 MED ORDER — LORAZEPAM 2 MG/ML IJ SOLN: 1.0000 mg | Freq: Four times a day (QID) | INTRAMUSCULAR | Status: DC | PRN

## 2015-07-24 MED ORDER — MORPHINE SULFATE (PF) 2 MG/ML IV SOLN: 1.0000 mg | INTRAVENOUS | Status: DC | PRN

## 2015-07-24 MED ORDER — ALUM & MAG HYDROXIDE-SIMETH 200-200-20 MG/5ML PO SUSP: 30.0000 mL | ORAL | Status: DC | PRN

## 2015-07-24 MED ORDER — CETYLPYRIDINIUM CHLORIDE 0.05 % MT LIQD
7.0000 mL | Freq: Two times a day (BID) | OROMUCOSAL | Status: DC
  Administered 2015-07-24 – 2015-07-26 (×5): 7 mL via OROMUCOSAL

## 2015-07-24 NOTE — Progress Notes (Signed)
Progress Note   Brenda Cook VWU:[id number] DOB: 05/26/1921 DOA: 07/19/2015 PCP: [name], MD   Brief Narrative:   Brenda Cook is an 81 y.o. female with a PMH of atrial fibrillation not on anticoagulation, chronic diastolic CHF, tachybradycardia syndrome status post pacemaker, and hospitalization for severe sepsis secondary to complicated pyelonephritis with nephrolithiasis requiring placement of a ureteral stent 02/2015, who was admitted with generalized weakness and poor oral intake. She has been non-ambulatory since 09/2014. She ultimately went to a SNF for rehabilitation and was discharged from there in July.  Assessment/Plan:   Principal Problem:   Acute urinary tract infection with dehydration and adult failure to thrive - Patient has had poor intake leading to dehydration. She is currently being hydrated. Diuretic therapy is on hold. - Urine cultures positive for enterococcus and Proteus. Continue ampicillin. - Consult palliative care, as patient frail and is at high risk for recurrent infections.   - Lengthy discussion held with daughter who is receptive to palliative care meeting/goals of care discussion.  Active Problems:   Essential hypertension - Reasonable on metoprolol. Systolic pressures as high as 295A.    SICK SINUS/ TACHY-BRADY SYNDROME/PACEMAKER/history of CVA - Continue metoprolol and aspirin.    Nephrolithiasis - Nonobstructing. Incidentally noted on plain abdominal films.    Hypokalemia/hyperkalemia - Initially hypokalemic, potassium elevated 07/23/15 in the setting of aggressive replacement. - Potassium WNL after discontinuing potassium from IV fluids.    Diarrhea - C. difficile antigen positive but toxin negative, consistent with colonization. - Likely antibiotic associated diarrhea. WBC WNL. - D/C PPI therapy to decrease risk of C. Difficile given colonization and treatment with antibiotics. - Start probiotics. - On oral vancomycin  empirically.    DVT Prophylaxis - Lovenox ordered.  Family Communication: No family currently at the bedside. Mickie Hillier (daughter) called and updated by telephone. Disposition Plan: Home with hospice when stable. Will not qualify for SNF. Code Status:     Code Status Orders        Start     Ordered   07/20/15 0022  Do not attempt resuscitation (DNR)   Continuous    Question Answer Comment  In the event of cardiac or respiratory ARREST Do not call a "code blue"   In the event of cardiac or respiratory ARREST Do not perform Intubation, CPR, defibrillation or ACLS   In the event of cardiac or respiratory ARREST Use medication by any route, position, wound care, and other measures to relive pain and suffering. May use oxygen, suction and manual treatment of airway obstruction as needed for comfort.      07/20/15 0021    Advance Directive Documentation        Most Recent Value   Type of Advance Directive  Living will, Healthcare Power of Attorney   Pre-existing out of facility DNR order (yellow form or pink MOST form)     "MOST" Form in Place?          IV Access:    Peripheral IV   Procedures and diagnostic studies:   Dg Chest 2 View  07/19/2015   CLINICAL DATA:  One month history of weakness and anorexia.  EXAM: CHEST  2 VIEW  COMPARISON:  03/22/2015  FINDINGS: The heart is mildly enlarged but stable. There is tortuosity and mild ectasia of the thoracic aorta. Chronic bronchitic type lung changes along with low lung volumes and vascular crowding. No infiltrates or effusions. No pneumothorax. The bony thorax is grossly intact.  Remote compression fractures are noted.  IMPRESSION: Mild cardiac enlargement and chronic lung changes but no acute pulmonary findings.   Electronically Signed   By: Rudie Meyer M.D.   On: 07/19/2015 17:26   Dg Abd 1 View  07/19/2015   CLINICAL DATA:  Nephrolithiasis  EXAM: ABDOMEN - 1 VIEW  COMPARISON:  06/14/2015  FINDINGS: Numerous pelvic  phleboliths.  Calcifications project over the RIGHT kidney but uncertain whether these represent small kidney stones or related to overlying costal cartilage.  Largest of these measures 6 mm diameter.  Slightly increased stool in rectum.  Bowel gas pattern otherwise normal.  Scattered atherosclerotic calcifications.  Severe osseous demineralization with pronounced levoconvex thoracolumbar scoliosis.  Pacemaker leads project over RIGHT atrium and RIGHT ventricle.  IMPRESSION: Questionable nonobstructing RIGHT renal calculi versus superimposed artifacts from costal cartilage.   Electronically Signed   By: Ulyses Southward M.D.   On: 07/19/2015 23:30   Ct Head Wo Contrast  07/19/2015   CLINICAL DATA:  Right eye drooping since yesterday.  Not eating.  EXAM: CT HEAD WITHOUT CONTRAST  TECHNIQUE: Contiguous axial images were obtained from the base of the skull through the vertex without intravenous contrast.  COMPARISON:  02/14/2004.  FINDINGS: Diffusely enlarged ventricles and subarachnoid spaces. Patchy white matter low density in both cerebral hemispheres. No intracranial hemorrhage, mass lesion or CT evidence of acute infarction. Unremarkable bones and included paranasal sinuses.  IMPRESSION: 1. No acute abnormality. 2. Moderate diffuse cerebral and cerebellar atrophy with progression. 3. Moderate chronic small vessel white matter ischemic changes in both cerebral hemispheres with progression.   Electronically Signed   By: Beckie Salts M.D.   On: 07/19/2015 15:39     Medical Consultants:    None.  Anti-Infectives:   Anti-infectives    Start     Dose/Rate Route Frequency Ordered Stop   07/22/15 1430  ampicillin (PRINCIPEN) capsule 500 mg     500 mg Oral 3 times per day 07/22/15 1348     07/21/15 0615  vancomycin (VANCOCIN) 50 mg/mL oral solution 125 mg     125 mg Oral 4 times per day 07/21/15 0609     07/20/15 2200  cefTRIAXone (ROCEPHIN) 1 g in dextrose 5 % 50 mL IVPB  Status:  Discontinued     1 g 100  mL/hr over 30 Minutes Intravenous Every 24 hours 07/20/15 0323 07/22/15 1345   07/19/15 2245  cefTRIAXone (ROCEPHIN) 1 g in dextrose 5 % 50 mL IVPB     1 g 100 mL/hr over 30 Minutes Intravenous  Once 07/19/15 2241 07/19/15 2350      Subjective:   Brenda Cook is lethargic, with poor appetite. 3 stools documented.  Objective:    Filed Vitals:   07/23/15 2110 07/23/15 2231 07/24/15 0121 07/24/15 0515  BP: 186/90 170/84 179/86 156/64  Pulse: 94 96 98 70  Temp: 98.1 F (36.7 C) 98.7 F (37.1 C) 98 F (36.7 C) 99.1 F (37.3 C)  TempSrc: Oral Axillary Oral Oral  Resp: Height:      Weight:      SpO2: 98% 97% 96% 97%    Intake/Output Summary (Last 24 hours) at 07/24/15 0824 Last data filed at 07/24/15 0600  Gross per 24 hour  Intake 1942.5 ml  Output   1950 ml  Net   -7.5 ml   Filed Weights   07/20/15 0016  Weight: 59 kg (130 lb 1.1 oz)    Exam: Gen:  NAD,  lethargic Cardiovascular:  RRR, No M/R/G Respiratory:  Lungs diminished Gastrointestinal:  Abdomen soft, NT/ND, + BS Extremities:  No C/E/C, soft silicone dressings right ankle/heel   Data Reviewed:    Labs: Basic Metabolic Panel:  Recent Labs Lab 07/20/15 0500 07/21/15 0555 07/22/15 0436 07/23/15 0450 07/24/15 0530  NA 141 139 141 141 138  K 3.3* 3.1* 3.0* 5.3* 3.7  CL 108 109 111 116* 110  CO2 23 21* 20* 21* 22  GLUCOSE 93 105* 101* 96 98  BUN 16 12 6 9 6   CREATININE 0.50 0.53 0.43* 0.59 0.47  CALCIUM 9.4 9.2 9.5 9.4 9.3  MG  --   --  1.6*  --   --    GFR Estimated Creatinine Clearance: 39.6 mL/min (by C-G formula based on Cr of 0.47). Liver Function Tests:  Recent Labs Lab 07/19/15 1543 07/21/15 0555 07/23/15 0450  AST 21 20 23   ALT 11* 12* 13*  ALKPHOS 72 63 59  BILITOT 0.9 0.4 0.3  PROT 6.4* 5.3* 5.2*  ALBUMIN 3.3* 2.9* 2.8*   CBC:  Recent Labs Lab 07/19/15 1543 07/20/15 0500 07/21/15 0555 07/22/15 0436 07/23/15 0450  WBC 9.3 6.8 5.7 6.8 6.3  NEUTROABS  8.1*  --   --   --   --   HGB 10.8* 10.0* 9.1* 10.2* 9.2*  HCT 31.7* 28.9* 26.9* 29.2* 27.1*  MCV 87.3 88.7 87.9 87.2 89.4  PLT 179 200 201 261 236   Microbiology Recent Results (from the past 240 hour(s))  Urine culture     Status: None   Collection Time: 07/19/15  9:43 PM  Result Value Ref Range Status   Specimen Description URINE, CATHETERIZED  Final   Special Requests NONE  Final   Culture   Final    60,000 COLONIES/ml ENTEROCOCCUS SPECIES 20,000 COLONIES/mL PROTEUS MIRABILIS Performed at [facility]    Report Status 07/22/2015 FINAL  Final   Organism ID, Bacteria ENTEROCOCCUS SPECIES  Final   Organism ID, Bacteria PROTEUS MIRABILIS  Final      Susceptibility   Proteus mirabilis - MIC*    AMPICILLIN <=2 SENSITIVE Sensitive     CEFAZOLIN <=4 SENSITIVE Sensitive     CEFTRIAXONE <=1 SENSITIVE Sensitive     CIPROFLOXACIN <=0.25 SENSITIVE Sensitive     GENTAMICIN <=1 SENSITIVE Sensitive     IMIPENEM 1 SENSITIVE Sensitive     NITROFURANTOIN 64 RESISTANT Resistant     TRIMETH/SULFA <=20 SENSITIVE Sensitive     AMPICILLIN/SULBACTAM <=2 SENSITIVE Sensitive     PIP/TAZO <=4 SENSITIVE Sensitive     * 20,000 COLONIES/mL PROTEUS MIRABILIS   Enterococcus species - MIC*    AMPICILLIN <=2 SENSITIVE Sensitive     LEVOFLOXACIN >=8 RESISTANT Resistant     NITROFURANTOIN <=16 SENSITIVE Sensitive     VANCOMYCIN 1 SENSITIVE Sensitive     * 60,000 COLONIES/ml ENTEROCOCCUS SPECIES  C difficile quick scan w PCR reflex     Status: Abnormal   Collection Time: 07/21/15  2:02 AM  Result Value Ref Range Status   C Diff antigen POSITIVE (A) NEGATIVE Final   C Diff toxin NEGATIVE NEGATIVE Final   C Diff interpretation   Final    C. difficile present, but toxin not detected. This indicates colonization. In most cases, this does not require treatment. If patient has signs and symptoms consistent with colitis, consider treatment.     Medications:   . ampicillin  500 mg Oral 3 times per  day  . aspirin EC  81 mg Oral Q breakfast  . enoxaparin (LOVENOX) injection  40 mg Subcutaneous Q24H  . feeding supplement (ENSURE ENLIVE)  237 mL Oral BID BM  . metoprolol succinate  12.5 mg Oral Daily  . pantoprazole  40 mg Oral Q breakfast  . sertraline  100 mg Oral Daily  . vancomycin  125 mg Oral 4 times per day   Continuous Infusions: . sodium chloride 75 mL/hr at 07/23/15 2132    Time spent: 35 minutes with > 50% of time discussing current diagnostic test results, clinical impression and plan of care.   LOS: 5 days   RAMA,CHRISTINA  [facility] Pager [phone]. If unable to reach me by pager, please call my cell phone at [phone].  *Please refer to [email or web address], password [email or web address] to get updated schedule on who will round on this patient, as hospitalists switch teams weekly. If 7PM-7AM, please contact night-coverage at [email or web address], password [email or web address] for any overnight needs.  07/24/2015, 8:24 AM

## 2015-07-24 NOTE — Consult Note (Signed)
Consultation Note Date: 07/24/2015   Patient Name: Brenda Cook  DOB: 29-Sep-1921  MRN: [id number]  Age / Sex: 81 y.o., female   PCP: Blair Heys, MD Referring Physician: Maryruth Bun Rama, MD  Reason for Consultation: Disposition, Establishing goals of care, Non pain symptom management, Pain control and Psychosocial/spiritual support  Palliative Care Assessment and Plan Summary of Established Goals of Care and Medical Treatment Preferences    Palliative Care Discussion Held Today:    This NP Lorinda Creed reviewed medical records, received report from team, assessed the patient and then meet at the patient's bedside and spoke by telephone with daughter Mickie Hillier  to discuss diagnosis, prognosis, GOC, EOL wishes disposition and options.  A detailed discussion was had today regarding advanced directives.  Concepts specific to code status, artifical feeding and hydration, continued IV antibiotics and rehospitalization was had.  The difference between a aggressive medical intervention path  and a palliative comfort care path for this patient at this time was had.  Values and goals of care important to patient and family were attempted to be elicited.  Concept of Hospice and Palliative Care were discussed  Natural trajectory and expectations at EOL were discussed.  Questions and concerns addressed. Family encouraged to call with questions or concerns.  PMT will continue to support holistically.   Primary Decision Maker: daughter  Goals of Care/Code Status/Advance Care Planning:  Focus is comfort, quality and dignity.  No further diagnostics, medical interventions to prolong life.   Code Status:  DNR/DNI  Artificial feeding: none, comfort feeds as tolerated   Antibiotics: none  Diagnostics: none  Rehospitalization: avoid   Symptom Management:    Pain/Dyspnea: Morphine 1 mg IV every 2 hrs prn  Agitation: Ativan 1 mg IV every 6 hrs prn  Psycho-social/Spiritual:     Support System: family   Prognosis: < 2 weeks  Discharge Planning:    Evaluate in morning for hospice facility       Chief Complaint: poor po intake, altered mental status  History of Present Illness:  81 y.o. female with a past medical history significant for atrial fibrillation, not on anticoagulation, chronic diastolic heart failure, tachybradycardia syndrome, with pacer, dementia and recent severe sepsis from pyelonephritis in May 2016 who presents with subacute weakness and inability to take anything by mouth.   At baseline the patient has not been able to walk since last December. She had a fall around that time, broke her right humerus and was put in rehabilitation. She had an episode of severe sepsis in May of this year when she was found to have a left obstructing stone, a Proteus UTI, and severe sepsis. She subsequently had a stent placed and had an uneventful recovery until the last few weeks.  Now, the daughter describes how the patient returned home from a SNF around July, but over the last 2-3 weeks she has noticed that her mother refuses to eat anything more than a small bite, refuses to take any of her medicines, has been less communicative even than before, and is more weak than usual. The daughter has tried many different consistencies of food but none of them successful. This week she began to suspect that her mother had a UTI, so she brought her to Dr. [name] office, where the urologist performed a urine cath suggested they go to the ER if her mother became sicker. Subsequently her mother started vomiting and so she presented to the ER tonight.  In the ED, the patient was  afebrile, and hemodynamically stable. She had no leukocytosis, but a cath urine specimen had copious white blood cells. She was given IV fluids, started on ceftriaxone and prepared for admission  Continued physical, functional and cognitive decline.  Family faced with advanced directives and  anticipatory care needs     Primary Diagnoses  Present on Admission:  . Dehydration . UTI (lower urinary tract infection) . Essential hypertension . PACEMAKER . SICK SINUS/ TACHY-BRADY SYNDROME . Nephrolithiasis . Adult failure to thrive . Acute urinary tract infection . Diarrhea with C. diff colonization  Palliative Review of Systems:    -unable to illicit, speech is garbled    I have reviewed the medical record, interviewed the patient and family, and examined the patient. The following aspects are pertinent.  Past Medical History  Diagnosis Date  . Hypertension   . Hypertensive cardiovascular disease   . Peripheral edema   . Chronic cough   . GERD (gastroesophageal reflux disease)   . Arthritis   . Atrial fibrillation   . Complete heart block     Has old Thera pacemaker on right side that is turned off and new MDT Versa unit on left  . Stroke   . Urinary tract infection    Social History   Social History  . Marital Status: Widowed    Spouse Name: N/A  . Number of Children: N/A  . Years of Education: N/A   Social History Main Topics  . Smoking status: Never Smoker   . Smokeless tobacco: None  . Alcohol Use: No  . Drug Use: No  . Sexual Activity: No   Other Topics Concern  . None   Social History Narrative   Family History  Problem Relation Age of Onset  . Stroke Father   . Heart failure Mother   . Leukemia Sister    Scheduled Meds: . ampicillin  500 mg Oral 3 times per day  . antiseptic oral rinse  7 mL Mouth Rinse BID  . aspirin EC  81 mg Oral Q breakfast  . enoxaparin (LOVENOX) injection  40 mg Subcutaneous Q24H  . feeding supplement (ENSURE ENLIVE)  237 mL Oral BID BM  . metoprolol succinate  12.5 mg Oral Daily  . saccharomyces boulardii  250 mg Oral BID  . sertraline  100 mg Oral Daily  . vancomycin  125 mg Oral 4 times per day   Continuous Infusions: . sodium chloride 75 mL/hr at 07/24/15 0946   PRN Meds:.acetaminophen, alum & mag  hydroxide-simeth, hydrALAZINE Medications Prior to Admission:  Prior to Admission medications   Medication Sig Start Date End Date Taking? Authorizing Provider  aspirin EC 81 MG tablet Take 81 mg by mouth daily with breakfast.    Yes Historical Provider, MD  metoprolol succinate (TOPROL-XL) 25 MG 24 hr tablet TAKE ONE-HALF (1/2) TABLET DAILY 07/18/14  Yes Hillis Range, MD  pantoprazole (PROTONIX) 40 MG tablet Take 40 mg by mouth daily with breakfast.    Yes Historical Provider, MD  Polyethyl Glycol-Propyl Glycol (SYSTANE OP) Place 1 drop into both eyes 2 (two) times daily.    Yes Historical Provider, MD  polyethylene glycol (MIRALAX / GLYCOLAX) packet Take 17 g by mouth daily. Patient taking differently: Take 17 g by mouth daily as needed for moderate constipation.  03/25/15  Yes Jerald Kief, MD  QUEtiapine (SEROQUEL) 25 MG tablet Take 50 mg by mouth at bedtime. 06/22/15  Yes Historical Provider, MD  sertraline (ZOLOFT) 100 MG tablet Take 100 mg by  mouth daily.    Yes Historical Provider, MD  traMADol (ULTRAM) 50 MG tablet Take 50 mg by mouth every 6 (six) hours as needed for moderate pain. for pain 06/28/15  Yes Historical Provider, MD   Allergies  Allergen Reactions  . Depakote [Valproic Acid] Other (See Comments)    disorientation  . Benicar [Olmesartan Medoxomil] Other (See Comments)    Not effective   CBC:    Component Value Date/Time   WBC 6.3 07/23/2015 0450   HGB 9.2* 07/23/2015 0450   HCT 27.1* 07/23/2015 0450   PLT 236 07/23/2015 0450   MCV 89.4 07/23/2015 0450   NEUTROABS 8.1* 07/19/2015 1543   LYMPHSABS 0.7 07/19/2015 1543   MONOABS 0.4 07/19/2015 1543   EOSABS 0.0 07/19/2015 1543   BASOSABS 0.0 07/19/2015 1543   Comprehensive Metabolic Panel:    Component Value Date/Time   NA 138 07/24/2015 0530   K 3.7 07/24/2015 0530   CL 110 07/24/2015 0530   CO2 22 07/24/2015 0530   BUN 6 07/24/2015 0530   CREATININE 0.47 07/24/2015 0530   CREATININE 1.11* 01/16/2012 1551    GLUCOSE 98 07/24/2015 0530   CALCIUM 9.3 07/24/2015 0530   AST 23 07/23/2015 0450   ALT 13* 07/23/2015 0450   ALKPHOS 59 07/23/2015 0450   BILITOT 0.3 07/23/2015 0450   PROT 5.2* 07/23/2015 0450   ALBUMIN 2.8* 07/23/2015 0450    Physical Exam:  Vital Signs: BP 147/83 mmHg  Pulse 69  Temp(Src) 98.1 F (36.7 C) (Oral)  Resp 16  Ht  (1.626 m)  Wt 59 kg (130 lb 1.1 oz)  BMI 22.32 kg/m2  SpO2 97% SpO2: SpO2: 97 % O2 Device: O2 Device: Not Delivered O2 Flow Rate:   Intake/output summary:  Intake/Output Summary (Last 24 hours) at 07/24/15 1515 Last data filed at 07/24/15 1400  Gross per 24 hour  Intake   2130 ml  Output   2500 ml  Net   -370 ml   LBM: Last BM Date: 07/23/15 Baseline Weight: Weight: 59 kg (130 lb 1.1 oz) Most recent weight: Weight: 59 kg (130 lb 1.1 oz)  Exam Findings:   General: ill appearing,  HEENT: moist buccal membranes CVS: RRR Resp: decreased in bases Abd: soft  Skin: skin is warm and dry Neuro: lethargic, confused           Palliative Performance Scale: 30 % at best                Additional Data Reviewed: Recent Labs     07/22/15  0436  07/23/15  0450  07/24/15  0530  WBC  6.8  6.3   --   HGB  10.2*  9.2*   --   PLT  261  236   --   NA  141  141  138  BUN  CREATININE  0.43*  0.59  0.47     Time In: 1400 Time Out: 1515 Time Total: 75 min  Greater than 50%  of this time was spent counseling and coordinating care related to the above assessment and plan.  Discussed with  Dr Darnelle Catalan  Signed by: Lorinda Creed, NP  Canary Brim, NP  07/24/2015, 3:15 PM  Please contact Palliative Medicine Team phone at [phone] for questions and concerns.   See AMION for contact information

## 2015-07-25 DIAGNOSIS — N390 Urinary tract infection, site not specified: Principal | ICD-10-CM

## 2015-07-25 DIAGNOSIS — Z7189 Other specified counseling: Secondary | ICD-10-CM

## 2015-07-25 DIAGNOSIS — N200 Calculus of kidney: Secondary | ICD-10-CM

## 2015-07-25 DIAGNOSIS — I10 Essential (primary) hypertension: Secondary | ICD-10-CM

## 2015-07-25 DIAGNOSIS — Z515 Encounter for palliative care: Secondary | ICD-10-CM

## 2015-07-25 LAB — COMPREHENSIVE METABOLIC PANEL
ALBUMIN: 3.2 g/dL — AB (ref 3.5–5.0)
ALT: 17 U/L (ref 14–54)
AST: 31 U/L (ref 15–41)
Alkaline Phosphatase: 64 U/L (ref 38–126)
Anion gap: 7 (ref 5–15)
BUN: 8 mg/dL (ref 6–20)
CHLORIDE: 107 mmol/L (ref 101–111)
CO2: 24 mmol/L (ref 22–32)
CREATININE: 0.55 mg/dL (ref 0.44–1.00)
Calcium: 9.8 mg/dL (ref 8.9–10.3)
GFR calc Af Amer: >60 mL/min (ref >60)
GFR calc non Af Amer: >60 mL/min (ref >60)
Glucose, Bld: 124 mg/dL — ABNORMAL HIGH (ref 65–99)
POTASSIUM: 3.5 mmol/L (ref 3.5–5.1)
SODIUM: 138 mmol/L (ref 135–145)
Total Bilirubin: 0.4 mg/dL (ref 0.3–1.2)
Total Protein: 6 g/dL — ABNORMAL LOW (ref 6.5–8.1)

## 2015-07-25 LAB — CBC
HCT: 32.2 % — ABNORMAL LOW (ref 36.0–46.0)
Hemoglobin: 11 g/dL — ABNORMAL LOW (ref 12.0–15.0)
MCH: 30.2 pg (ref 26.0–34.0)
MCHC: 34.2 g/dL (ref 30.0–36.0)
MCV: 88.5 fL (ref 78.0–100.0)
PLATELETS: 307 10*3/uL (ref 150–400)
RBC: 3.64 MIL/uL — AB (ref 3.87–5.11)
RDW: 14.4 % (ref 11.5–15.5)
WBC: 7.9 10*3/uL (ref 4.0–10.5)

## 2015-07-25 MED ORDER — MORPHINE SULFATE (CONCENTRATE) 10 MG/0.5ML PO SOLN: 5.0000 mg | ORAL | Status: DC | PRN

## 2015-07-25 MED ORDER — LORAZEPAM 1 MG PO TABS: 1.0000 mg | ORAL_TABLET | ORAL | Status: DC | PRN

## 2015-07-25 NOTE — Progress Notes (Signed)
Daily Progress Note   Patient Name: Brenda Cook       Date: 07/25/2015 DOB: [birth year]  Age: 81 y.o. MRN#: [id number] Attending Physician: Marinda Elk, MD Primary Care Physician: [name], MD Admit Date: 07/19/2015  Reason for Consultation/Follow-up: Establishing goals of care, Inpatient hospice referral, Non pain symptom management, Pain control, Psychosocial/spiritual support and Terminal care  Subjective:     -demonstrates continued physical decline, focus of care is full comfort  - was  hopeful for hospice facility but at this time will not meet criteria (still taking sips and no active symptom management), so family  is open to home with hospice until patient can transition to hospice facility   Length of Stay: 6 days  Current Medications: Scheduled Meds:  . antiseptic oral rinse  7 mL Mouth Rinse BID  . feeding supplement (ENSURE ENLIVE)  237 mL Oral BID BM    Continuous Infusions: . sodium chloride 10 mL/hr at 07/24/15 1545    PRN Meds: acetaminophen, alum & mag hydroxide-simeth, LORazepam, morphine injection  Palliative Performance Scale: 20 %     Vital Signs: BP 148/79 mmHg  Pulse 72  Temp(Src) 98.4 F (36.9 C) (Oral)  Resp 16  Ht  (1.626 m)  Wt 59 kg (130 lb 1.1 oz)  BMI 22.32 kg/m2  SpO2 98% SpO2: SpO2: 98 % O2 Device: O2 Device: Not Delivered O2 Flow Rate:    Intake/output summary:  Intake/Output Summary (Last 24 hours) at 07/25/15 1031 Last data filed at 07/25/15 1024  Gross per 24 hour  Intake 1173.75 ml  Output   1500 ml  Net -326.25 ml   LBM: Last BM Date: 07/23/15 Baseline Weight: Weight: 59 kg (130 lb 1.1 oz) Most recent weight: Weight: 59 kg (130 lb 1.1 oz)  Physical Exam:              General: ill appearing,  HEENT: moist buccal membranes CVS: RRR Resp: decreased in bases Abd: soft  Skin: skin is warm and dry Neuro: lethargic, confused  Additional Data Reviewed: Recent Labs     07/23/15  0450   07/24/15  0530  07/25/15  1000  WBC  6.3   --   7.9  HGB  9.2*   --   11.0*  PLT  236   --   307  NA  141  138   --   BUN  9  6   --   CREATININE  0.59  0.47   --      Problem List:  Patient Active Problem List   Diagnosis Date Noted  . DNR (do not resuscitate) discussion 07/25/2015  . Palliative care encounter 07/25/2015  . Diarrhea with C. diff colonization 07/24/2015  . Adult failure to thrive 07/23/2015  . Nephrolithiasis   . UTI (lower urinary tract infection) 07/20/2015  . Dehydration 07/19/2015  . Fracture of right humerus 10/04/2014  . Hyponatremia 10/04/2014  . Acute urinary tract infection   . Humeral surgical neck fracture   . Complete heart block 12/24/2011  . Advanced age 43/13/2012  . Edema 05/09/2011  . Essential hypertension 09/18/2010  . SICK SINUS/ TACHY-BRADY SYNDROME 09/18/2010  . PACEMAKER 09/17/2010     Palliative Care Assessment & Plan    Code Status:  DNR  Goals of Care   Focus is comfort, quality and dignity. No further life prolonging intervetnions    Symptom Management  Pain/Dyspnea: Morphine/Roxanol  5 mg po/sl every 1 hrs prn  Agitation:  Ativan 1 mg  Po/sl every 4 hrs prn    Prognosis: < 4 weeks  Discharge Planning: Home with hospice, will write for choice.  Focus of care is comfort.    Care plan was discussed with  Neill Loft  Thank you for allowing the Palliative Medicine Team to assist in the care of this patient.   Time In: 0800 Time Out: 0835 Total Time 35 min Prolonged Time Billed  no     Greater than 50%  of this time was spent counseling and coordinating care related to the above assessment and plan.     Canary Brim, NP  07/25/2015, 10:31 AM  Please contact Palliative Medicine Team phone at [phone] for questions and concerns.

## 2015-07-25 NOTE — Progress Notes (Addendum)
Medicare Important Message Given: 07/26/15 Third Notification.  CM notes plan is for residential hospice; CSW aware.  No other CM needs were communicated.

## 2015-07-25 NOTE — Care Management Note (Signed)
Case Management Note  Patient Details  Name: Brenda Cook MRN: [id number] Date of Birth: 21-Nov-1920  Subjective/Objective:                   Acute urinary tract infection/acute failure thrive: Action/Plan: Discharge planning  Expected Discharge Date: 07/26/15                Expected Discharge Plan:  Home w Hospice Care  In-House Referral:     Discharge planning Services  CM Consult  Post Acute Care Choice:    Choice offered to:  Adult Children  DME Arranged:    DME Agency:  NA  HH Arranged:  NA HH Agency:  [facility]  Status of Service:  Completed, signed off  Medicare Important Message Given:  Yes-second notification given Date Medicare IM Given:    Medicare IM give by:    Date Additional Medicare IM Given:    Additional Medicare Important Message give by:     If discussed at Long Length of Stay Meetings, dates discussed:    Additional Comments: CM received call pt was not accepted at facility hospice and will be going home with hospice.  CM called daughter of pt as pt has dementia, Brenda Cook [phone].  Brenda Cook confirms pt will be returning home with her and she chooses [facility].  Pt is now bed bound and Pt has bed, hoyer lift at home and Brenda Cook expresses pt will need oxygen, and a bedside tray on wheels.  CM called [facility] and spoke with Brenda Cook.  Brenda Cook plans to call [facility].  [facility] has access to EPIC and no faxing was necessary.  CM will notify Brenda Cook when MD writes DC summary and will notify CSW to arrange for transport home on ambulance when pt is ready for discharge.  Will continue to follow for needs. Yves Dill, RN 07/25/2015, 3:11 PM

## 2015-07-25 NOTE — Progress Notes (Signed)
[facility] PROGRESS NOTE    Progress Note   Brenda Cook:[id number] DOB: 09-Jul-1921 DOA: 07/19/2015 PCP: [name], MD   Brief Narrative:   Brenda Cook is an 81 y.o. female with past medical history of atrial fibrillation on coagulation, chronic diastolic heart failure, tachybradycardia syndrome status post pacemaker hospitalized for severe sepsis secondary to complicated pyelonephritis with nephrolithiasis requiring ureteral stent placement on 03/16/2015. Comes back in to the hospital for possible UTI.  Assessment/Plan:  Acute urinary tract infection/acute failure thrive: Diuretic therapy were held. Urine cultures were positive for enterococcus and Proteus sensitive to ampicillin. After long discussion due to the patient for agility and high risk of recurrent infection daughter decided to move towards comfort care.  Essential hypertension Continue metoprolol.  SICK SINUS/ TACHY-BRADY SYNDROME Continue metoprolol and aspirin.  Nephrolithiasis Incidental noted on abdominal film.  Diarrhea with C. diff colonization  DNR (do not resuscitate) discussion  Palliative care encounter    DVT Prophylaxis - Lovenox ordered.  Family Communication: None at bedside will update daughter by phone Disposition Plan: Home when stable. Code Status:     Code Status Orders        Start     Ordered   07/20/15 0022  Do not attempt resuscitation (DNR)   Continuous    Question Answer Comment  In the event of cardiac or respiratory ARREST Do not call a "code blue"   In the event of cardiac or respiratory ARREST Do not perform Intubation, CPR, defibrillation or ACLS   In the event of cardiac or respiratory ARREST Use medication by any route, position, wound care, and other measures to relive pain and suffering. May use oxygen, suction and manual treatment of airway obstruction as needed for comfort.      07/20/15 0021    Advance Directive Documentation        Most  Recent Value   Type of Advance Directive  Living will, Healthcare Power of Attorney   Pre-existing out of facility DNR order (yellow form or pink MOST form)     "MOST" Form in Place?          IV Access:    Peripheral IV   Procedures and diagnostic studies:   No results found.   Medical Consultants:    None.  Anti-Infectives:   Anti-infectives    Start     Dose/Rate Route Frequency Ordered Stop   07/22/15 1430  ampicillin (PRINCIPEN) capsule 500 mg  Status:  Discontinued     500 mg Oral 3 times per day 07/22/15 1348 07/24/15 1545   07/21/15 0615  vancomycin (VANCOCIN) 50 mg/mL oral solution 125 mg  Status:  Discontinued     125 mg Oral 4 times per day 07/21/15 0609 07/24/15 1545   07/20/15 2200  cefTRIAXone (ROCEPHIN) 1 g in dextrose 5 % 50 mL IVPB  Status:  Discontinued     1 g 100 mL/hr over 30 Minutes Intravenous Every 24 hours 07/20/15 0323 07/22/15 1345   07/19/15 2245  cefTRIAXone (ROCEPHIN) 1 g in dextrose 5 % 50 mL IVPB     1 g 100 mL/hr over 30 Minutes Intravenous  Once 07/19/15 2241 07/19/15 2350      Subjective:    Brenda Cook   Objective:    Filed Vitals:   07/24/15 1423 07/24/15 1448 07/24/15 2147 07/25/15 0552  BP: 182/76 147/83 165/88 148/79  Pulse: 71 69 77 72  Temp: 98.1 F (36.7 C)  98.7 F (37.1 C)  98.4 F (36.9 C)  TempSrc: Oral  Oral Oral  Resp: Height:      Weight:      SpO2: 98% 97% 97% 98%    Intake/Output Summary (Last 24 hours) at 07/25/15 1038 Last data filed at 07/25/15 1024  Gross per 24 hour  Intake 1173.75 ml  Output   1500 ml  Net -326.25 ml   Filed Weights   07/20/15 0016  Weight: 59 kg (130 lb 1.1 oz)    Exam: Gen:  NAD, nonverbal Cardiovascular:  RRR. Respiratory:  Lungs CTAB Gastrointestinal:  Abdomen soft, NT/ND, + BS Extremities:  No C/E/C   Data Reviewed:    Labs: Basic Metabolic Panel:  Recent Labs Lab 07/20/15 0500 07/21/15 0555 07/22/15 0436 07/23/15 0450  07/24/15 0530  NA 141 139 141 141 138  K 3.3* 3.1* 3.0* 5.3* 3.7  CL 108 109 111 116* 110  CO2 23 21* 20* 21* 22  GLUCOSE 93 105* 101* 96 98  BUN CREATININE 0.50 0.53 0.43* 0.59 0.47  CALCIUM 9.4 9.2 9.5 9.4 9.3  MG  --   --  1.6*  --   --    GFR Estimated Creatinine Clearance: 39.6 mL/min (by C-G formula based on Cr of 0.47). Liver Function Tests:  Recent Labs Lab 07/19/15 1543 07/21/15 0555 07/23/15 0450  AST ALT 11* 12* 13*  ALKPHOS 72 63 59  BILITOT 0.9 0.4 0.3  PROT 6.4* 5.3* 5.2*  ALBUMIN 3.3* 2.9* 2.8*   No results for input(s): LIPASE, AMYLASE in the last 168 hours. No results for input(s): AMMONIA in the last 168 hours. Coagulation profile No results for input(s): INR, PROTIME in the last 168 hours.  CBC:  Recent Labs Lab 07/19/15 1543 07/20/15 0500 07/21/15 0555 07/22/15 0436 07/23/15 0450 07/25/15 1000  WBC 9.3 6.8 5.7 6.8 6.3 7.9  NEUTROABS 8.1*  --   --   --   --   --   HGB 10.8* 10.0* 9.1* 10.2* 9.2* 11.0*  HCT 31.7* 28.9* 26.9* 29.2* 27.1* 32.2*  MCV 87.3 88.7 87.9 87.2 89.4 88.5  PLT 179 200 201 261 236 307   Cardiac Enzymes: No results for input(s): CKTOTAL, CKMB, CKMBINDEX, TROPONINI in the last 168 hours. BNP (last 3 results) No results for input(s): PROBNP in the last 8760 hours. CBG: No results for input(s): GLUCAP in the last 168 hours. D-Dimer: No results for input(s): DDIMER in the last 72 hours. Hgb A1c: No results for input(s): HGBA1C in the last 72 hours. Lipid Profile: No results for input(s): CHOL, HDL, LDLCALC, TRIG, CHOLHDL, LDLDIRECT in the last 72 hours. Thyroid function studies: No results for input(s): TSH, T4TOTAL, T3FREE, THYROIDAB in the last 72 hours.  Invalid input(s): FREET3 Anemia work up: No results for input(s): VITAMINB12, FOLATE, FERRITIN, TIBC, IRON, RETICCTPCT in the last 72 hours. Sepsis Labs:  Recent Labs Lab 07/21/15 0555 07/22/15 0436 07/23/15 0450 07/25/15 1000  WBC  5.7 6.8 6.3 7.9   Microbiology Recent Results (from the past 240 hour(s))  Urine culture     Status: None   Collection Time: 07/19/15  9:43 PM  Result Value Ref Range Status   Specimen Description URINE, CATHETERIZED  Final   Special Requests NONE  Final   Culture   Final    60,000 COLONIES/ml ENTEROCOCCUS SPECIES 20,000 COLONIES/mL PROTEUS MIRABILIS Performed at [facility]    Report Status 07/22/2015 FINAL  Final  Organism ID, Bacteria ENTEROCOCCUS SPECIES  Final   Organism ID, Bacteria PROTEUS MIRABILIS  Final      Susceptibility   Proteus mirabilis - MIC*    AMPICILLIN <=2 SENSITIVE Sensitive     CEFAZOLIN <=4 SENSITIVE Sensitive     CEFTRIAXONE <=1 SENSITIVE Sensitive     CIPROFLOXACIN <=0.25 SENSITIVE Sensitive     GENTAMICIN <=1 SENSITIVE Sensitive     IMIPENEM 1 SENSITIVE Sensitive     NITROFURANTOIN 64 RESISTANT Resistant     TRIMETH/SULFA <=20 SENSITIVE Sensitive     AMPICILLIN/SULBACTAM <=2 SENSITIVE Sensitive     PIP/TAZO <=4 SENSITIVE Sensitive     * 20,000 COLONIES/mL PROTEUS MIRABILIS   Enterococcus species - MIC*    AMPICILLIN <=2 SENSITIVE Sensitive     LEVOFLOXACIN >=8 RESISTANT Resistant     NITROFURANTOIN <=16 SENSITIVE Sensitive     VANCOMYCIN 1 SENSITIVE Sensitive     * 60,000 COLONIES/ml ENTEROCOCCUS SPECIES  C difficile quick scan w PCR reflex     Status: Abnormal   Collection Time: 07/21/15  2:02 AM  Result Value Ref Range Status   C Diff antigen POSITIVE (A) NEGATIVE Final   C Diff toxin NEGATIVE NEGATIVE Final   C Diff interpretation   Final    C. difficile present, but toxin not detected. This indicates colonization. In most cases, this does not require treatment. If patient has signs and symptoms consistent with colitis, consider treatment.     Medications:   . antiseptic oral rinse  7 mL Mouth Rinse BID  . feeding supplement (ENSURE ENLIVE)  237 mL Oral BID BM   Continuous Infusions: . sodium chloride 10 mL/hr at 07/24/15 1545     Time spent: 15 min   LOS: 6 days   Marinda Elk  [facility] Pager [phone]  *Please refer to [email or web address], password [email or web address] to get updated schedule on who will round on this patient, as hospitalists switch teams weekly. If 7PM-7AM, please contact night-coverage at [email or web address], password [email or web address] for any overnight needs.  07/25/2015, 10:38 AM

## 2015-07-26 DIAGNOSIS — R197 Diarrhea, unspecified: Secondary | ICD-10-CM

## 2015-07-26 MED ORDER — MORPHINE SULFATE (CONCENTRATE) 10 MG/0.5ML PO SOLN: 5.0000 mg | ORAL | Status: AC | PRN

## 2015-07-26 MED ORDER — LORAZEPAM 1 MG PO TABS: 1.0000 mg | ORAL_TABLET | ORAL | Status: AC | PRN

## 2015-07-26 NOTE — Care Management Important Message (Signed)
Important Message  Patient Details  Name: Brenda Cook MRN: [id number] Date of Birth: January 30, 1921   Medicare Important Message Given:  Yes-third notification given    Renie Ora 07/26/2015, 3:49 PMImportant Message  Patient Details  Name: Brenda Cook MRN: [id number] Date of Birth: Jun 07, 1921   Medicare Important Message Given:  Yes-third notification given    Renie Ora 07/26/2015, 3:49 PM

## 2015-07-26 NOTE — Progress Notes (Signed)
CM called Mickie Hillier to arrange for time for ambulance transport.  16:00 is the time pt will be transported home.  Address verfied by Talbert Forest to be the same as facesheet.  CM left message on [facility] rep, Margie's secured and confidential voicemail stating MD has completed DC summary.  No other CM needs were communicated.

## 2015-07-26 NOTE — Progress Notes (Signed)
Nutrition Follow-up  DOCUMENTATION CODES:   Not applicable  INTERVENTION:  Ensure Enlive po BID, each supplement provides 350 kcal and 20 grams of protein    NUTRITION DIAGNOSIS:   Inadequate oral intake related to poor appetite as evidenced by per patient/family report, meal completion < 25%.    GOAL:   Patient will meet greater than or equal to 90% of their needs    MONITOR:   PO intake, Supplement acceptance, I & O's, Weight trends, Labs, Skin  REASON FOR ASSESSMENT:   Malnutrition Screening Tool    ASSESSMENT:   81 y.o. female with a past medical history significant for atrial fibrillation, not on anticoagulation, chronic diastolic heart failure, tachybradycardia syndrome, with pacer, and recent severe sepsis from pyelonephritis in May 2016 who presents with subacute weakness and inability to take anything by mouth.  9/29 - Pt has not changed any, will be discharged at 1600 today to residential hospice. Pt continues to refuse food, meal completion 11%  Diet Order:  Diet regular Room service appropriate?: No; Fluid consistency:: Thin Diet - low sodium heart healthy  Skin:  Wound (see comment) (Echymossis to left, right arm, and to labia.)  Last BM:  9/29  Height:   Ht Readings from Last 1 Encounters:  07/20/15  (1.626 m)    Weight:   Wt Readings from Last 1 Encounters:  07/20/15 130 lb 1.1 oz (59 kg)    Ideal Body Weight:  52.27 kg (kg)  BMI:  Body mass index is 22.32 kg/(m^2).  Estimated Nutritional Needs:   Kcal:  1180-1380  Protein:  50-60 grams  Fluid:  >=/ 1.1L/day  EDUCATION NEEDS:   No education needs identified at this time  Dionne Ano. Jannet Calip, MS, RD LDN After Hours/Weekend Pager [phone]

## 2015-07-26 NOTE — Discharge Summary (Signed)
Physician Discharge Summary  Brenda Cook KGM:[id number] DOB: 1921-08-04 DOA: 07/19/2015  PCP: [name], MD  Admit date: 07/19/2015 Discharge date: 07/26/2015  Time spent: 25  minutes  Recommendations for Outpatient Follow-up:  1. She will go home with residential hospice. 2. [facility] to follow at home.  Discharge Diagnoses:  Principal Problem:   Acute urinary tract infection Active Problems:   Essential hypertension   SICK SINUS/ TACHY-BRADY SYNDROME   PACEMAKER   Dehydration   UTI (lower urinary tract infection)   Nephrolithiasis   Adult failure to thrive   Diarrhea with C. diff colonization   DNR (do not resuscitate) discussion   Palliative care encounter   Discharge Condition: guarded  Diet recommendation: Comfort feeds  Filed Weights   07/20/15 0016  Weight: 59 kg (130 lb 1.1 oz)    History of present illness:  81 year old female with past medical history of chronic atrial fibrillation not on anticoagulation due to falls and advanced dementia, chronic diastolic heart failure, tachybradycardia syndrome with pacer recently admitted on May 2016 for severe sepsis due to pyelonephritis that comes in as the patient refuses to eat anything has not been able to take are not willing to take her medication, less communicative. And vomiting that started today she was out to the ER.  Hospital Course:  Acute urinary tract infection with dehydration and adult failure to thrive Essential hypertension Sick sinus syndrome Nephrolithiasis Hypokalemia Hyperkalemia Diarrhea Due to her multiple comorbidities and her significant decline over the last several month it was discussed with the family about goals of care and agreed to meet with the palliative care team. After the meeting the family decided to move towards comfort care. There were no beds at hospice facility so family read they could take care of her at home. Her treatments were stopped. And she was  started on oral morphine and Ativan which the patient tolerated the hospital. She will be discharged home and will be follow-up with residential hospice as an outpatient.  Procedures:  CT head  Abdominal x-ray  Chest x-ray  Consultations:  pmt  Discharge Exam: Filed Vitals:   07/26/15 0957  BP: 128/78  Pulse: 70  Temp: 98 F (36.7 C)  Resp: 15    General: Awake alert and oriented 1 Cardiovascular: Irregular rate and rhythm Respiratory: Good air movement clear to auscultation  Discharge Instructions   Discharge Instructions    Diet - low sodium heart healthy    Complete by:  As directed      Increase activity slowly    Complete by:  As directed           Current Discharge Medication List    START taking these medications   Details  LORazepam (ATIVAN) 1 MG tablet Take 1 tablet (1 mg total) by mouth every 4 (four) hours as needed for anxiety. Qty: 30 tablet, Refills: 0    Morphine Sulfate (MORPHINE CONCENTRATE) 10 MG/0.5ML SOLN concentrated solution Take 0.25 mLs (5 mg total) by mouth every hour as needed for moderate pain, severe pain or shortness of breath. Qty: 42 mL, Refills: 0      CONTINUE these medications which have NOT CHANGED   Details  metoprolol succinate (TOPROL-XL) 25 MG 24 hr tablet TAKE ONE-HALF (1/2) TABLET DAILY Qty: 45 tablet, Refills: 0    polyethylene glycol (MIRALAX / GLYCOLAX) packet Take 17 g by mouth daily. Qty: 14 each, Refills: 0    QUEtiapine (SEROQUEL) 25 MG tablet Take 50 mg by mouth  at bedtime. Refills: 6    sertraline (ZOLOFT) 100 MG tablet Take 100 mg by mouth daily.     traMADol (ULTRAM) 50 MG tablet Take 50 mg by mouth every 6 (six) hours as needed for moderate pain. for pain Refills: 0      STOP taking these medications     aspirin EC 81 MG tablet      pantoprazole (PROTONIX) 40 MG tablet      Polyethyl Glycol-Propyl Glycol (SYSTANE OP)        Allergies  Allergen Reactions  . Depakote [Valproic Acid]  Other (See Comments)    disorientation  . Benicar [Olmesartan Medoxomil] Other (See Comments)    Not effective   Follow-up Information    Follow up with [facility].   Why:  for hospice services   Contact information:   [address] [place] [address] [phone]        The results of significant diagnostics from this hospitalization (including imaging, microbiology, ancillary and laboratory) are listed below for reference.    Significant Diagnostic Studies: Dg Chest 2 View  07/19/2015   CLINICAL DATA:  One month history of weakness and anorexia.  EXAM: CHEST  2 VIEW  COMPARISON:  03/22/2015  FINDINGS: The heart is mildly enlarged but stable. There is tortuosity and mild ectasia of the thoracic aorta. Chronic bronchitic type lung changes along with low lung volumes and vascular crowding. No infiltrates or effusions. No pneumothorax. The bony thorax is grossly intact. Remote compression fractures are noted.  IMPRESSION: Mild cardiac enlargement and chronic lung changes but no acute pulmonary findings.   Electronically Signed   By: Rudie Meyer M.D.   On: 07/19/2015 17:26   Dg Abd 1 View  07/19/2015   CLINICAL DATA:  Nephrolithiasis  EXAM: ABDOMEN - 1 VIEW  COMPARISON:  06/14/2015  FINDINGS: Numerous pelvic phleboliths.  Calcifications project over the RIGHT kidney but uncertain whether these represent small kidney stones or related to overlying costal cartilage.  Largest of these measures 6 mm diameter.  Slightly increased stool in rectum.  Bowel gas pattern otherwise normal.  Scattered atherosclerotic calcifications.  Severe osseous demineralization with pronounced levoconvex thoracolumbar scoliosis.  Pacemaker leads project over RIGHT atrium and RIGHT ventricle.  IMPRESSION: Questionable nonobstructing RIGHT renal calculi versus superimposed artifacts from costal cartilage.   Electronically Signed   By: Ulyses Southward M.D.   On: 07/19/2015 23:30   Ct Head Wo  Contrast  07/19/2015   CLINICAL DATA:  Right eye drooping since yesterday.  Not eating.  EXAM: CT HEAD WITHOUT CONTRAST  TECHNIQUE: Contiguous axial images were obtained from the base of the skull through the vertex without intravenous contrast.  COMPARISON:  02/14/2004.  FINDINGS: Diffusely enlarged ventricles and subarachnoid spaces. Patchy white matter low density in both cerebral hemispheres. No intracranial hemorrhage, mass lesion or CT evidence of acute infarction. Unremarkable bones and included paranasal sinuses.  IMPRESSION: 1. No acute abnormality. 2. Moderate diffuse cerebral and cerebellar atrophy with progression. 3. Moderate chronic small vessel white matter ischemic changes in both cerebral hemispheres with progression.   Electronically Signed   By: Beckie Salts M.D.   On: 07/19/2015 15:39    Microbiology: Recent Results (from the past 240 hour(s))  Urine culture     Status: None   Collection Time: 07/19/15  9:43 PM  Result Value Ref Range Status   Specimen Description URINE, CATHETERIZED  Final   Special Requests NONE  Final   Culture  Final    60,000 COLONIES/ml ENTEROCOCCUS SPECIES 20,000 COLONIES/mL PROTEUS MIRABILIS Performed at [facility]    Report Status 07/22/2015 FINAL  Final   Organism ID, Bacteria ENTEROCOCCUS SPECIES  Final   Organism ID, Bacteria PROTEUS MIRABILIS  Final      Susceptibility   Proteus mirabilis - MIC*    AMPICILLIN <=2 SENSITIVE Sensitive     CEFAZOLIN <=4 SENSITIVE Sensitive     CEFTRIAXONE <=1 SENSITIVE Sensitive     CIPROFLOXACIN <=0.25 SENSITIVE Sensitive     GENTAMICIN <=1 SENSITIVE Sensitive     IMIPENEM 1 SENSITIVE Sensitive     NITROFURANTOIN 64 RESISTANT Resistant     TRIMETH/SULFA <=20 SENSITIVE Sensitive     AMPICILLIN/SULBACTAM <=2 SENSITIVE Sensitive     PIP/TAZO <=4 SENSITIVE Sensitive     * 20,000 COLONIES/mL PROTEUS MIRABILIS   Enterococcus species - MIC*    AMPICILLIN <=2 SENSITIVE Sensitive     LEVOFLOXACIN >=8  RESISTANT Resistant     NITROFURANTOIN <=16 SENSITIVE Sensitive     VANCOMYCIN 1 SENSITIVE Sensitive     * 60,000 COLONIES/ml ENTEROCOCCUS SPECIES  C difficile quick scan w PCR reflex     Status: Abnormal   Collection Time: 07/21/15  2:02 AM  Result Value Ref Range Status   C Diff antigen POSITIVE (A) NEGATIVE Final   C Diff toxin NEGATIVE NEGATIVE Final   C Diff interpretation   Final    C. difficile present, but toxin not detected. This indicates colonization. In most cases, this does not require treatment. If patient has signs and symptoms consistent with colitis, consider treatment.     Labs: Basic Metabolic Panel:  Recent Labs Lab 07/21/15 0555 07/22/15 0436 07/23/15 0450 07/24/15 0530 07/25/15 1000  NA 139 141 141 138 138  K 3.1* 3.0* 5.3* 3.7 3.5  CL 109 111 116* 110 107  CO2 21* 20* 21* 22 24  GLUCOSE 105* 101* 96 98 124*  BUN CREATININE 0.53 0.43* 0.59 0.47 0.55  CALCIUM 9.2 9.5 9.4 9.3 9.8  MG  --  1.6*  --   --   --    Liver Function Tests:  Recent Labs Lab 07/19/15 1543 07/21/15 0555 07/23/15 0450 07/25/15 1000  AST ALT 11* 12* 13* 17  ALKPHOS 72 63 59 64  BILITOT 0.9 0.4 0.3 0.4  PROT 6.4* 5.3* 5.2* 6.0*  ALBUMIN 3.3* 2.9* 2.8* 3.2*   No results for input(s): LIPASE, AMYLASE in the last 168 hours. No results for input(s): AMMONIA in the last 168 hours. CBC:  Recent Labs Lab 07/19/15 1543 07/20/15 0500 07/21/15 0555 07/22/15 0436 07/23/15 0450 07/25/15 1000  WBC 9.3 6.8 5.7 6.8 6.3 7.9  NEUTROABS 8.1*  --   --   --   --   --   HGB 10.8* 10.0* 9.1* 10.2* 9.2* 11.0*  HCT 31.7* 28.9* 26.9* 29.2* 27.1* 32.2*  MCV 87.3 88.7 87.9 87.2 89.4 88.5  PLT 179 200 201 261 236 307   Cardiac Enzymes: No results for input(s): CKTOTAL, CKMB, CKMBINDEX, TROPONINI in the last 168 hours. BNP: BNP (last 3 results)  Recent Labs  03/21/15 0355  BNP 279.9*    ProBNP (last 3 results) No results for input(s): PROBNP in the last  8760 hours.  CBG: No results for input(s): GLUCAP in the last 168 hours.   Signed:  Marinda Elk  [facility] 07/26/2015, 10:20 AM

## 2015-08-13 ENCOUNTER — Encounter: Payer: Medicare Other | Admitting: Internal Medicine

## 2015-08-28 DEATH — deceased

## 2015-11-18 IMAGING — CT CT HEAD W/O CM
3 series · 18 of 30 positions shown, 20 images · non-contrast
Comparison: 02/14/2004.

CLINICAL DATA: Right eye drooping since yesterday.  Not eating.

EXAM:
CT HEAD WITHOUT CONTRAST
TECHNIQUE: Contiguous axial images were obtained from the base of the skull
through the vertex without intravenous contrast.

[Series 2: ang.axial · axial · 0.45mm/px · z∈[-132,-28]mm · 8 of 29 slices shown]
[im 4/29  brain]
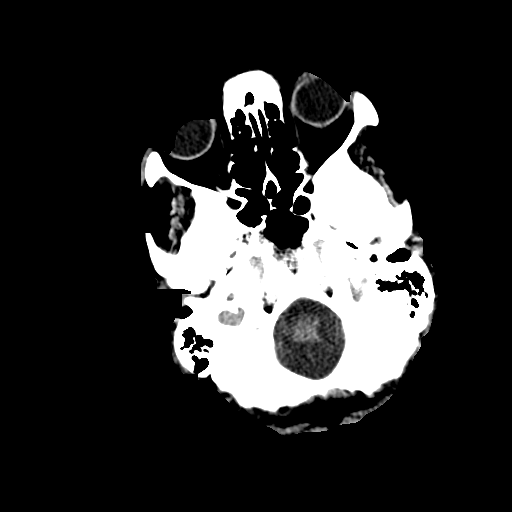
[im 7/29  brain]
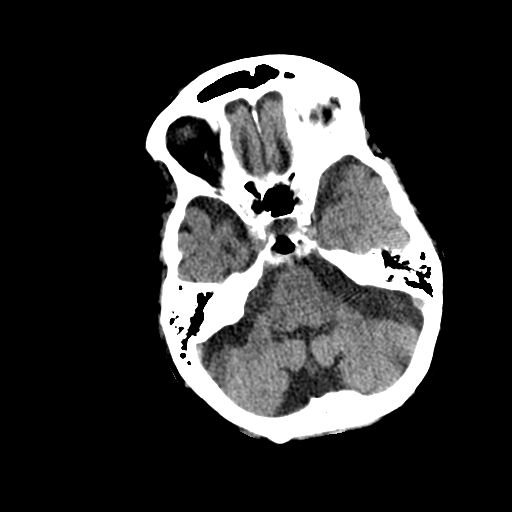
[im 10/29  brain]
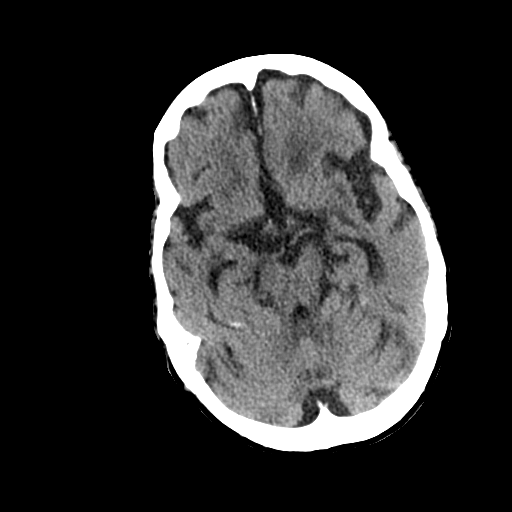
[im 13/29  brain]
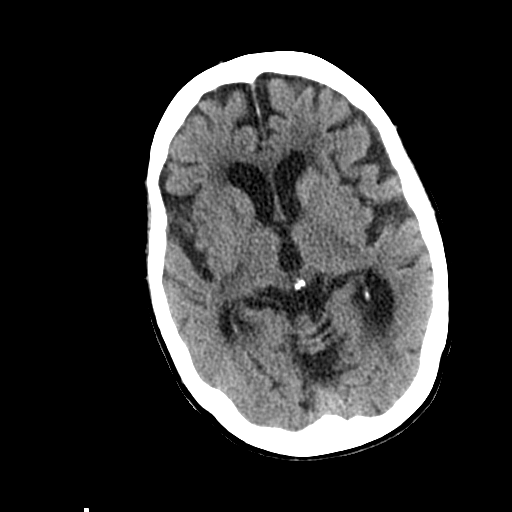
[im 16/29  brain]
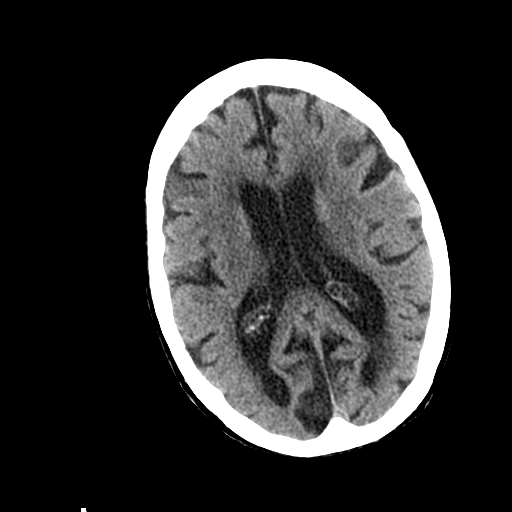
[im 19/29  brain]
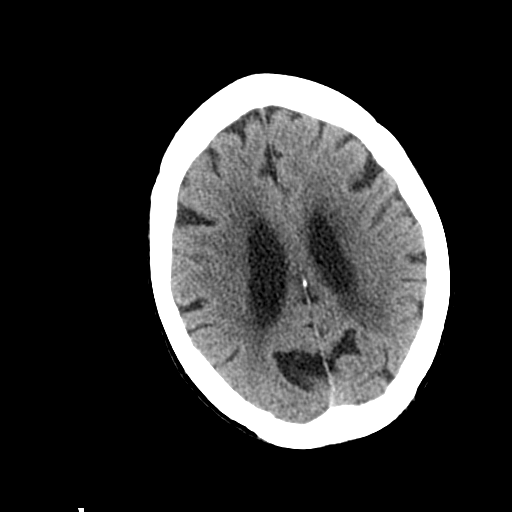
[im 22/29  brain]
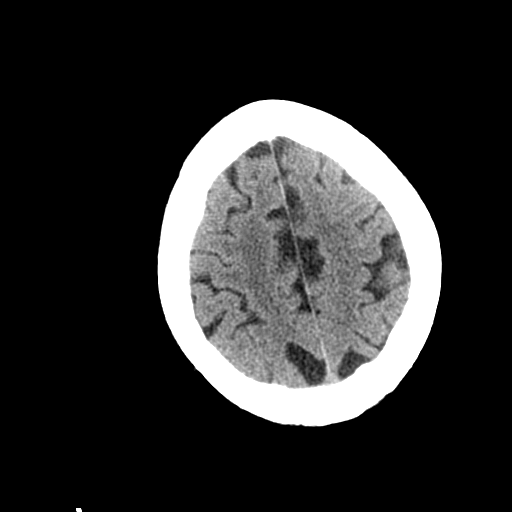
[im 25/29  brain]
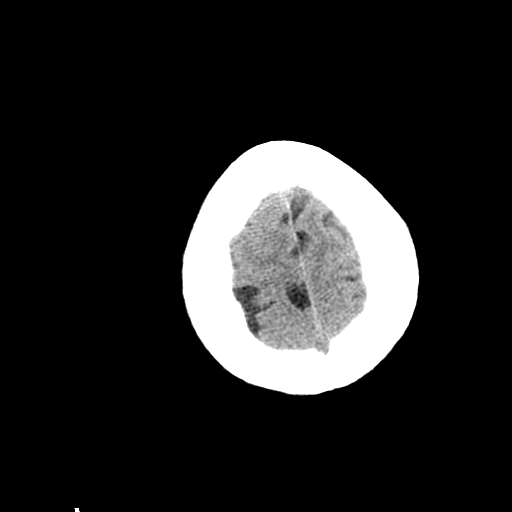

[Series 3: head w/o · axial · non-contrast · 0.45mm/px · z∈[-143,-33]mm · 8 of 30 slices shown, 10 images]
[im 4/30  brain]
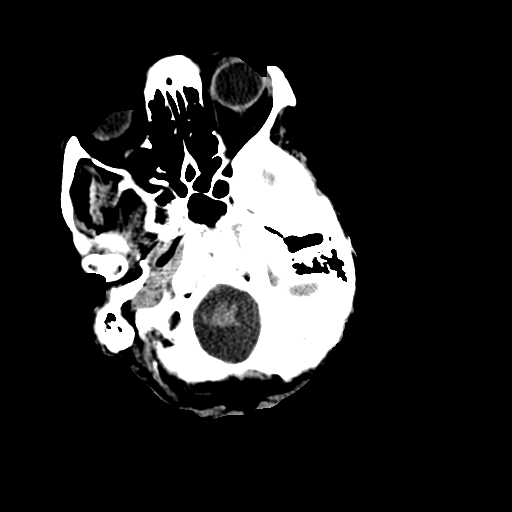
[im 4/30  bone]
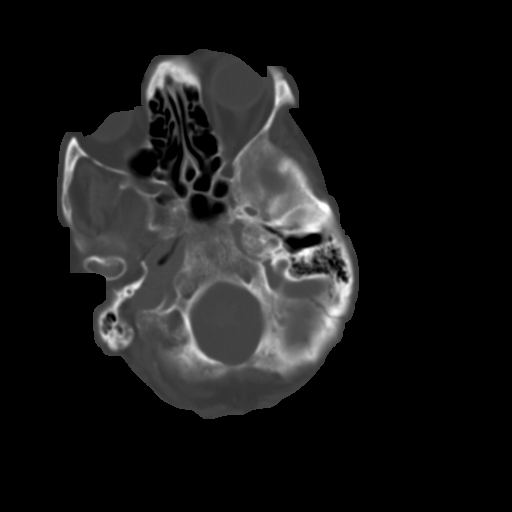
[im 7/30  brain]
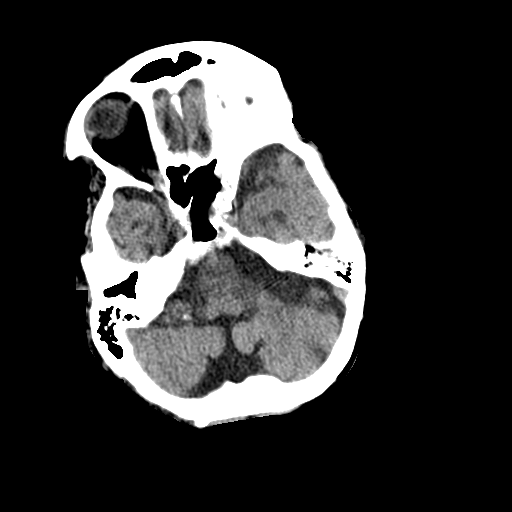
[im 10/30  brain]
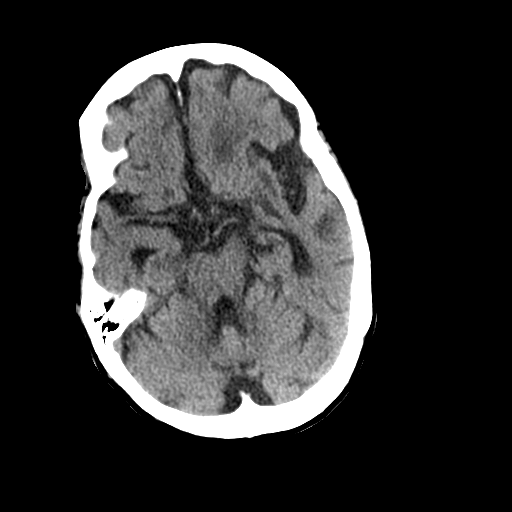
[im 13/30  brain]
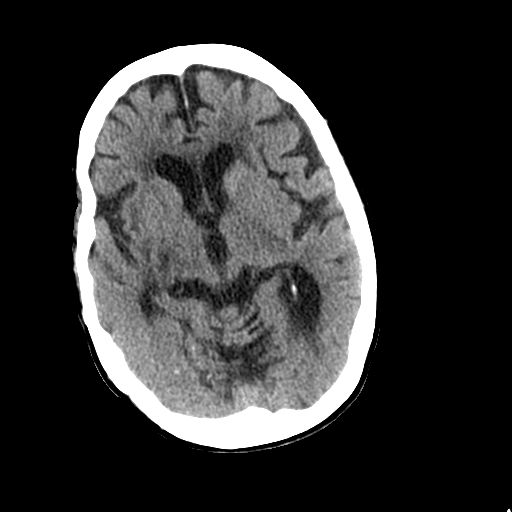
[im 17/30  brain]
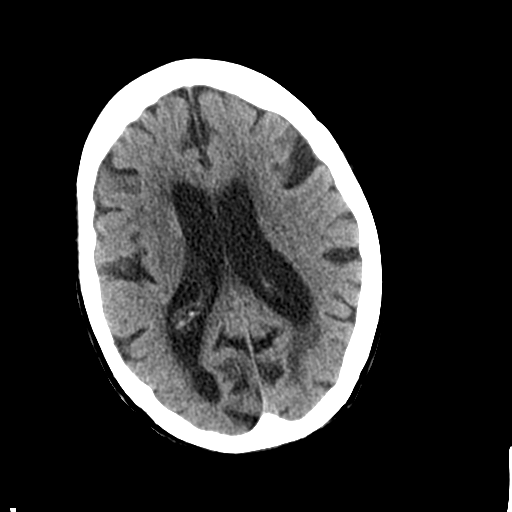
[im 17/30  bone]
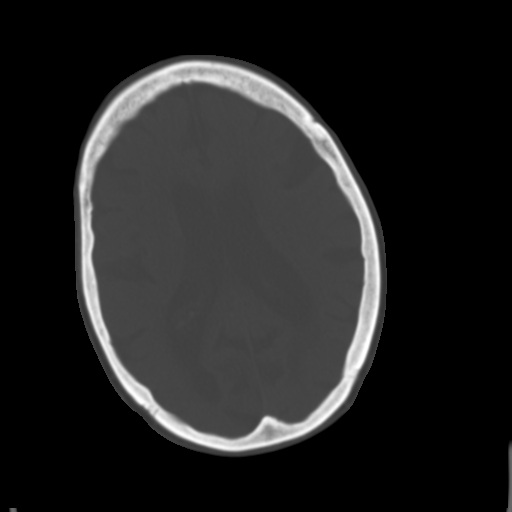
[im 20/30  brain]
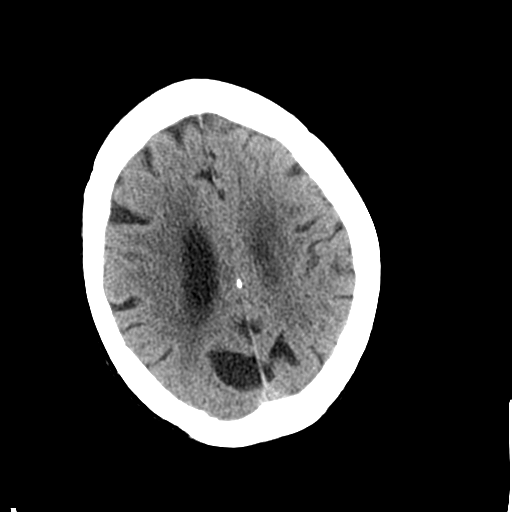
[im 23/30  brain]
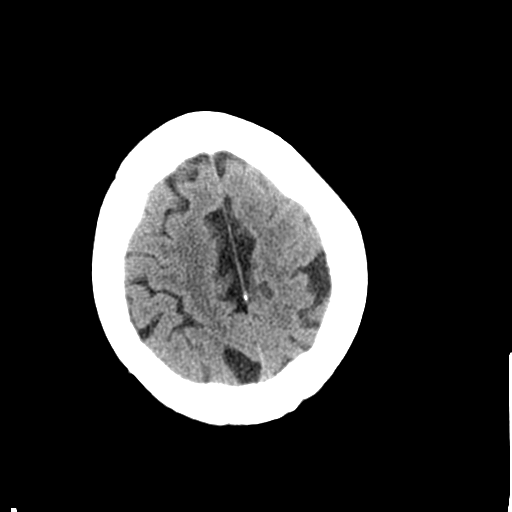
[im 26/30  brain]
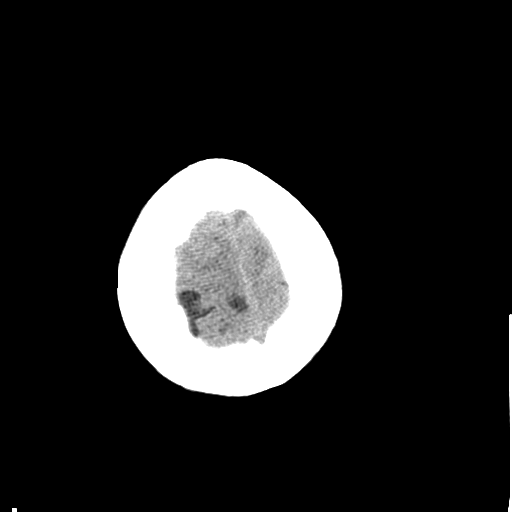

[Series 4: bone windows · axial · 0.45mm/px · z∈[-143,-128]mm · 2 of 30 slices shown]
[im 4/30  bone]
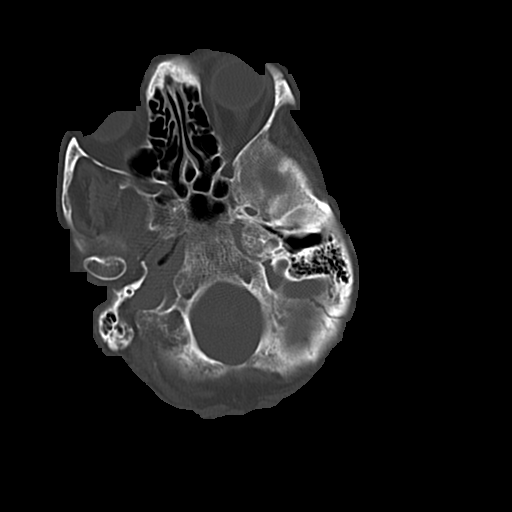
[im 7/30  bone]
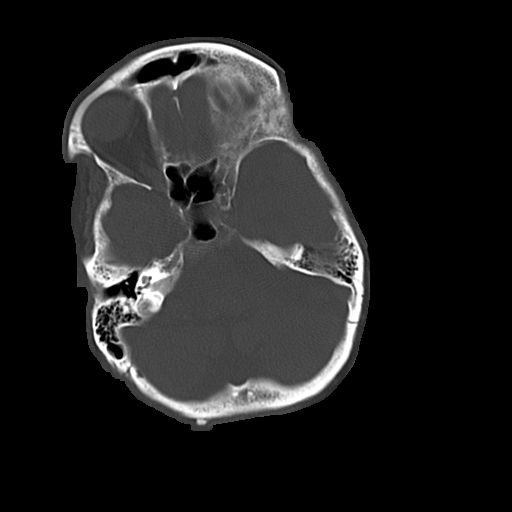

[18 of 30 positions shown; findings below may reference images not displayed]

FINDINGS: Diffusely enlarged ventricles and subarachnoid spaces. Patchy white
matter low density in both cerebral hemispheres. No intracranial
hemorrhage, mass lesion or CT evidence of acute infarction.
Unremarkable bones and included paranasal sinuses.
IMPRESSION: 1. No acute abnormality.
2. Moderate diffuse cerebral and cerebellar atrophy with
progression.
3. Moderate chronic small vessel white matter ischemic changes in
both cerebral hemispheres with progression.

## 2015-12-14 ENCOUNTER — Telehealth: Payer: Self-pay | Admitting: Internal Medicine

## 2015-12-14 NOTE — Telephone Encounter (Signed)
Spoke w/ pt daughter and informed her that I could order a return kit for pt home monitor. She agreed to this.

## 2015-12-14 NOTE — Telephone Encounter (Signed)
Pt passed away 24-Aug-2015. She wants to know what she needs to do with her remote device?

## 2015-12-14 NOTE — Telephone Encounter (Signed)
LMOVM for pt emergency contact to return my call.
